# Patient Record
Sex: Female | Born: 1970
Health system: Southern US, Community
[De-identification: ages and names within clinical notes are randomized; demographics above are authoritative.]

## PROBLEM LIST (undated history)

## (undated) ENCOUNTER — Inpatient Hospital Stay: Payer: Self-pay | Admitting: Obstetrics and Gynecology

## (undated) DIAGNOSIS — N760 Acute vaginitis: Secondary | ICD-10-CM

## (undated) DIAGNOSIS — R319 Hematuria, unspecified: Secondary | ICD-10-CM

## (undated) DIAGNOSIS — C801 Malignant (primary) neoplasm, unspecified: Secondary | ICD-10-CM

## (undated) DIAGNOSIS — R0600 Dyspnea, unspecified: Secondary | ICD-10-CM

## (undated) DIAGNOSIS — G4730 Sleep apnea, unspecified: Secondary | ICD-10-CM

## (undated) DIAGNOSIS — J189 Pneumonia, unspecified organism: Secondary | ICD-10-CM

## (undated) DIAGNOSIS — R609 Edema, unspecified: Secondary | ICD-10-CM

## (undated) DIAGNOSIS — E079 Disorder of thyroid, unspecified: Secondary | ICD-10-CM

## (undated) DIAGNOSIS — D649 Anemia, unspecified: Secondary | ICD-10-CM

## (undated) DIAGNOSIS — N63 Unspecified lump in unspecified breast: Secondary | ICD-10-CM

## (undated) DIAGNOSIS — B9689 Other specified bacterial agents as the cause of diseases classified elsewhere: Secondary | ICD-10-CM

## (undated) DIAGNOSIS — I10 Essential (primary) hypertension: Secondary | ICD-10-CM

## (undated) HISTORY — DX: Unspecified lump in unspecified breast: N63.0

## (undated) HISTORY — DX: Other specified bacterial agents as the cause of diseases classified elsewhere: B96.89

## (undated) HISTORY — PX: ESOPHAGOGASTRODUODENOSCOPY: SHX1529

## (undated) HISTORY — PX: ABDOMINAL HYSTERECTOMY: SHX81

## (undated) HISTORY — DX: Malignant (primary) neoplasm, unspecified: C80.1

## (undated) HISTORY — PX: WISDOM TOOTH EXTRACTION: SHX21

## (undated) HISTORY — DX: Edema, unspecified: R60.9

## (undated) HISTORY — DX: Other specified bacterial agents as the cause of diseases classified elsewhere: N76.0

## (undated) HISTORY — PX: TONSILLECTOMY: SUR1361

## (undated) HISTORY — DX: Disorder of thyroid, unspecified: E07.9

## (undated) HISTORY — DX: Hematuria, unspecified: R31.9

## (undated) HISTORY — PX: DILATION AND CURETTAGE OF UTERUS: SHX78

## (undated) HISTORY — PX: FOOT SURGERY: SHX648

## (undated) HISTORY — PX: TUBAL LIGATION: SHX77

## (undated) HISTORY — DX: Essential (primary) hypertension: I10

---

## 2004-10-15 ENCOUNTER — Ambulatory Visit (HOSPITAL_COMMUNITY): Admission: RE | Admit: 2004-10-15 | Discharge: 2004-10-15 | Payer: Self-pay | Admitting: Gynecology

## 2004-10-15 ENCOUNTER — Encounter (INDEPENDENT_AMBULATORY_CARE_PROVIDER_SITE_OTHER): Payer: Self-pay | Admitting: *Deleted

## 2005-08-16 ENCOUNTER — Ambulatory Visit (HOSPITAL_COMMUNITY): Admission: RE | Admit: 2005-08-16 | Discharge: 2005-08-16 | Payer: Self-pay | Admitting: Gynecology

## 2005-11-08 ENCOUNTER — Inpatient Hospital Stay (HOSPITAL_COMMUNITY): Admission: AD | Admit: 2005-11-08 | Discharge: 2005-11-08 | Payer: Self-pay | Admitting: Gynecology

## 2005-12-28 ENCOUNTER — Ambulatory Visit (HOSPITAL_COMMUNITY): Admission: RE | Admit: 2005-12-28 | Discharge: 2005-12-28 | Payer: Self-pay | Admitting: Gynecology

## 2005-12-30 ENCOUNTER — Encounter (INDEPENDENT_AMBULATORY_CARE_PROVIDER_SITE_OTHER): Payer: Self-pay | Admitting: Specialist

## 2005-12-30 ENCOUNTER — Inpatient Hospital Stay (HOSPITAL_COMMUNITY): Admission: RE | Admit: 2005-12-30 | Discharge: 2006-01-02 | Payer: Self-pay | Admitting: Gynecology

## 2008-02-27 ENCOUNTER — Encounter (INDEPENDENT_AMBULATORY_CARE_PROVIDER_SITE_OTHER): Payer: Self-pay | Admitting: Gynecology

## 2008-02-27 ENCOUNTER — Ambulatory Visit: Payer: Self-pay | Admitting: Gynecology

## 2009-08-26 ENCOUNTER — Ambulatory Visit: Payer: Self-pay | Admitting: Internal Medicine

## 2010-02-04 ENCOUNTER — Ambulatory Visit: Payer: Self-pay | Admitting: Internal Medicine

## 2010-02-06 ENCOUNTER — Emergency Department: Payer: Self-pay | Admitting: Emergency Medicine

## 2010-12-07 ENCOUNTER — Ambulatory Visit: Payer: Self-pay | Admitting: Internal Medicine

## 2010-12-19 ENCOUNTER — Ambulatory Visit: Payer: Self-pay | Admitting: Internal Medicine

## 2011-03-30 ENCOUNTER — Ambulatory Visit: Payer: Self-pay | Admitting: Internal Medicine

## 2011-04-10 LAB — HM MAMMOGRAPHY: HM Mammogram: NORMAL

## 2011-05-06 NOTE — Op Note (Signed)
[name], [name]              ACCOUNT NO.:  [id number]   MEDICAL RECORD NO.:  [id number]          PATIENT TYPE:  INP   LOCATION:  9143                          FACILITY:  [facility]   PHYSICIAN:  Ginger Carne, MD  DATE OF BIRTH:  1971/08/05   DATE OF PROCEDURE:  12/30/2005  DATE OF DISCHARGE:                                 OPERATIVE REPORT   PREOPERATIVE DIAGNOSES:  1.  Fetal macrosomia.  2.  Sterilization request.  3.  Term pregnancy.   PREOPERATIVE DIAGNOSES:  1.  Term viable delivery of female infant.  2.  Sterilization.   PROCEDURES:  1.  Primary low transverse cesarean section.  2.  Pomeroy postpartum bilateral tubal ligation.   SURGEON:  Ginger Carne, M.D.   ASSISTANT:  None.   COMPLICATIONS:  None immediate.   ESTIMATED BLOOD LOSS:  650 mL.   SPECIMEN:  Cord blood and portions of right and left tubes.   ANESTHESIA:  Spinal and local Nesacaine intraperitoneal.   OPERATIVE FINDINGS:  Term infant female delivered in the early afternoon on  November 29, 2006, Apgar 9/9, weight 9 pounds 11 ounces, no gross  abnormalities.  Baby cried spontaneously at delivery, vertex presentation.  Amniotic fluid clear.  Fetus was unengaged.  Uterus, tubes and ovaries  showed normal decidual changes of pregnancy.  Placenta contained no  abnormalities, central insertion of the cord, three vessels.   OPERATIVE PROCEDURE:  The patient prepped and draped in the usual fashion  and placed in the left lateral supine position.  Betadine solution used for  antiseptic.  Patient catheterized prior to the procedure.  After adequate  spinal analgesia, a Pfannenstiel incision was made and the abdomen opened.  Bladder flap incised transversely, lower uterine segment incised  transversely.  Baby delivered, cord clamped and cut, and infant given to the  pediatrics staff after bulb suctioning.  Placenta removed manually.  Uterus  inspected.  Close the uterine musculature and one layer of 0  Vicryl running  interlocking suture.   Pomeroy bilateral tubal ligation performed by identifying the tubes separate  and apart from their respective round ligaments, 2-3 cm of tube in the  isthmus-ampullary junction were incorporated as a loop, with a 2-0 plain  catgut suture ties twice, afterwards tube severed above said knots and the  tips cauterized.  Bleeding points hemostatically checked.  Blood clots  removed from the abdomen.  Closure of the parietal peritoneum with 2-0  Vicryl running interlocking suture, 0 Vicryl for the fascia, 3-0 Vicryl for  the subcutaneous layer and 4-0 Prolene for a subcuticular closure.  Instrument and sponge count were correct.  The patient tolerated procedure  well and returned to the post anesthesia recovery room in excellent  condition.      Ginger Carne, MD  Electronically Signed     SHB/MEDQ  D:  12/30/2005  T:  12/30/2005  Job:  [id number]

## 2011-05-06 NOTE — Discharge Summary (Signed)
NAMEMERRYN, [name]              ACCOUNT NO.:  [id number]   MEDICAL RECORD NO.:  [id number]          PATIENT TYPE:  INP   LOCATION:  9143                          FACILITY:  [facility]   PHYSICIAN:  Ginger Carne, MD  DATE OF BIRTH:  1971/10/24   DATE OF ADMISSION:  12/30/2005  DATE OF DISCHARGE:  01/02/2006                                 DISCHARGE SUMMARY   REASON FOR HOSPITALIZATION:  Suspected fetal macrosomia. Estimated fetal  weight of 4706 g plus or minus 15%.   IN-HOSPITAL PROCEDURES:  Primary low transverse cesarean section and Pomeroy  bilateral tubal ligation.   FINAL DIAGNOSES:  1.  Sterilization.  2.  Fetal macrosomia.  3.  Term viable delivery of female infant.   HOSPITAL COURSE:  This patient is a 40 year old gravida 3 para 2-0-1-2  Caucasian female at 3 and zero-sevenths weeks gestation at the time of  admission; January19,2006, [facility]; admitted for a low transverse cesarean  section with bilateral tubal ligation because of suspected fetal macrosomia  and a request for permanent sterilization. The patient clinically appeared  to have a fetus weighing approximately 10 pounds. Ultrasound obtained on the  day prior to her admission at [facility] [place] indicated an  estimated weight of 4706 g. The cervix was uninducible and the fetus was  unengaged. The patient is O positive, rubella immune, group B strep  negative. Diabetic screen performed at [redacted] weeks gestation was within normal  limits and multiple marker screen test at 16 weeks was normal.   The patient's medical history includes hypothyroidism and depression.   The patient underwent a primary low transverse cesarean section and Pomeroy  bilateral tubal ligation on December 30, 2005. The weight of the infant was 9  pounds 11 ounces. Apgars were 9 and 9, no gross abnormalities, the baby  cried spontaneously at delivery. The patient's postoperative course was  uneventful. She was afebrile, voided well,  incision was dry, calves without  tenderness, and lungs were clear. Postoperative hemoglobin 10.7, hematocrit  31.2.   The patient was given routine postoperative instructions including  contacting the office for temperature elevation above 100.4 degrees  Fahrenheit, increasing abdominal pain, incisional drainage or erythema,  gastrointestinal or genitourinary symptoms, and/or heavy vaginal bleeding.  The patient was advised about wound care.   The patient was discharged with Percocet 5/325 one to two every 4-6 hours.  She will continue Levoxyl 75 mcg daily and Zoloft 50 mg daily. She will  return in 3 days to have her subcuticular suture removed and 6 weeks for her  postoperative visit.      Ginger Carne, MD  Electronically Signed     SHB/MEDQ  D:  01/02/2006  T:  01/02/2006  Job:  [id number]

## 2011-05-06 NOTE — Op Note (Signed)
[name], [name]              ACCOUNT NO.:  [id number]   MEDICAL RECORD NO.:  [id number]          PATIENT TYPE:  AMB   LOCATION:  SDC                           [facility]ILITY:  [facility]   PHYSICIAN:  Ginger Carne, MD  DATE OF BIRTH:  [birth year]   DATE OF PROCEDURE:  10/15/2004  DATE OF DISCHARGE:                                 OPERATIVE REPORT   PREOPERATIVE DIAGNOSES:  Abnormal uterine bleeding.   POSTOPERATIVE DIAGNOSES:  1.  Abnormal uterine bleeding.  2.  Hyperplasia of endometrium.   PROCEDURE:  Hysteroscopy with dilatation and curettage.   SURGEON:  Ginger Carne, MD   ASSISTANT:  None.   COMPLICATIONS:  None immediate.   ANESTHESIA:  General.   ESTIMATED BLOOD LOSS:  Negligible.   SPECIMENS:  Uterine curettings.   FINDINGS:  External genitalia, vulva and vagina were normal, cervix smooth  without erosions or lesions and though cervical canal appeared normal on  hysteroscopy, advancement of said scope revealed evidence of diffuse  hyperplasia without evidence for polyps, fibroids or other abnormalities.  Both ostia were identified. The fundus appeared normal. All four walls were  identified.   DESCRIPTION OF PROCEDURE:  The patient was prepped and draped in the usual  fashion, placed in lithotomy position, Betadine solution used for antiseptic  and the patient was not catheterized. A tenaculum placed on the anterior lip  of the cervix, dilatation to accommodate a hysteroscope was followed by  sharp curettage. Specimen to pathology. The patient returned to the post  anesthesia recovery room in excellent condition.     Stev   SHB/MEDQ  D:  10/15/2004  T:  10/15/2004  Job:  [id number]

## 2011-05-06 NOTE — H&P (Signed)
NAMEJASMA, [name]              ACCOUNT NO.:  [id number]   MEDICAL RECORD NO.:  [id number]          PATIENT TYPE:  INP   LOCATION:  NA                            [facility]   PHYSICIAN:  Shelby Carne, MD  DATE OF BIRTH:  12-Sep-1971   DATE OF ADMISSION:  12/30/2005  DATE OF DISCHARGE:                                HISTORY & PHYSICAL   REASON FOR HOSPITALIZATION:  Fetal macrosomia, estimated fetal weight 4706  g, plus/minus 15%.   HISTORY OF PRESENT ILLNESS:  This patient is a 40 year old gravida 3, para 1-  0-1-1 Caucasian female, [facility] January 06, 2005, at 39-0/7 weeks' gestation  admitted for a primary low cesarean section with bilateral tubal ligation  for purposes of sterilization.  The patient underwent an ultrasound at the  [facility] [place] on the December 29, 2004, with findings  consistent with an estimated fetal weight of 4706 grams plus/minus 706  grams.  The fetus is in the cephalic presentation.  Normal fluid for a  gestational age and no fetal anatomical abnormalities.   The patient's antenatal course has been uneventful.  She has a history of  hypothyroidism and depression.  She is O positive, rubella immune, group B  strep negative.  Her diabetic screen performed at 26 weeks was within normal  limits, and multiple marker screen test was negative at 16 weeks.   PAST OBSTETRICAL/GYNECOLOGIC HISTORY:  The patient had a normal vaginal  delivery in 1999 and a spontaneous miscarriage in 2005.   MEDICAL HISTORY:  Hypothyroidism and depression.   MEDICATIONS:  1.  Levoxyl 75 mcg daily.  2.  Zoloft 15 mcg daily.   PAST SURGICAL HISTORY:  Negative.   SOCIAL HISTORY:  Smoking:  The patient smokes less than 1/2 pack of  cigarettes daily.  Denies alcohol or drug abuse.   REVIEW OF SYSTEMS:  Negative.   FAMILY HISTORY:  Family history is negative for genetic diseases or first-  degree relatives with breast, colon, ovarian or uterine carcinoma.   PHYSICAL EXAMINATION:  VITAL SIGNS:  Blood pressure 123/82.  Weight 287  pounds.  HEENT:  Grossly normal.  CHEST:  Clear.  CARDIAC:  Exam without murmurs or enlargements.  BREASTS:  Without masses, discharge, thickenings or tenderness.  CHEST:  Clear to percussion and auscultation.  EXTREMITIES:  Normal.  LYMPHATICS:  Normal.  SKIN:  Normal.  NEUROLOGIC:  Normal.  MUSCULOSKELETAL:  Normal.  ABDOMEN:  The abdomen reveals full-term pregnancy.  PELVIC:  Long, closed and thick cervix.  External genitalia, vulva, vagina  are normal without erosions or lesions present.   IMPRESSION:  Fetal macrosomia at term.   PLAN:  The patient was apprised of the nature and risks related to  delivering a fetus with an estimated fetal weight of 4700 grams.  She  understands the risks of shoulder dystocia as well as pelvic floor injury  are possibilities.  She also understands ultrasounds are plus/minus 15% in  their estimation of weight.  The patient has opted for a primary low  transverse cesarean section and bilateral tubal ligation for sterilization.  The nature of said procedure discussed in detail and all questions answered  to their satisfaction of said patient.      Shelby Carne, MD  Electronically Signed     SHB/MEDQ  D:  12/29/2005  T:  12/30/2005  Job:  [id number]

## 2011-05-10 LAB — HM PAP SMEAR: HM Pap smear: NORMAL

## 2011-12-20 DIAGNOSIS — N63 Unspecified lump in unspecified breast: Secondary | ICD-10-CM

## 2011-12-20 HISTORY — PX: BREAST BIOPSY: SHX20

## 2011-12-20 HISTORY — DX: Unspecified lump in unspecified breast: N63.0

## 2012-03-08 ENCOUNTER — Other Ambulatory Visit: Payer: Self-pay | Admitting: *Deleted

## 2012-03-08 DIAGNOSIS — Z760 Encounter for issue of repeat prescription: Secondary | ICD-10-CM

## 2012-03-08 MED ORDER — HYDROCHLOROTHIAZIDE 25 MG PO TABS: 25.0000 mg | ORAL_TABLET | Freq: Every day | ORAL | Status: DC

## 2012-03-08 NOTE — Progress Notes (Signed)
Patient has not been seen by Dr. Dan Humphreys. Needs to establish

## 2012-05-09 ENCOUNTER — Encounter: Payer: Self-pay | Admitting: Internal Medicine

## 2012-05-09 ENCOUNTER — Ambulatory Visit (INDEPENDENT_AMBULATORY_CARE_PROVIDER_SITE_OTHER): Payer: Managed Care, Other (non HMO) | Admitting: Internal Medicine

## 2012-05-09 VITALS — BP 123/79 | HR 79 | Temp 98.1°F | Resp 16 | Ht 64.75 in | Wt 293.8 lb

## 2012-05-09 DIAGNOSIS — I10 Essential (primary) hypertension: Secondary | ICD-10-CM

## 2012-05-09 DIAGNOSIS — Z0000 Encounter for general adult medical examination without abnormal findings: Secondary | ICD-10-CM

## 2012-05-09 DIAGNOSIS — E039 Hypothyroidism, unspecified: Secondary | ICD-10-CM | POA: Insufficient documentation

## 2012-05-09 LAB — CBC WITH DIFFERENTIAL/PLATELET
Basophils Absolute: 0.1 10*3/uL (ref 0.0–0.1)
Basophils Relative: 0.7 % (ref 0.0–3.0)
Eosinophils Absolute: 0.3 10*3/uL (ref 0.0–0.7)
Eosinophils Relative: 2.7 % (ref 0.0–5.0)
HCT: 40.9 % (ref 36.0–46.0)
Hemoglobin: 13.7 g/dL (ref 12.0–15.0)
Lymphocytes Relative: 30.1 % (ref 12.0–46.0)
Lymphs Abs: 3.1 10*3/uL (ref 0.7–4.0)
MCHC: 33.5 g/dL (ref 30.0–36.0)
MCV: 88 fl (ref 78.0–100.0)
Monocytes Absolute: 0.5 10*3/uL (ref 0.1–1.0)
Monocytes Relative: 4.6 % (ref 3.0–12.0)
Neutro Abs: 6.3 10*3/uL (ref 1.4–7.7)
Neutrophils Relative %: 61.9 % (ref 43.0–77.0)
Platelets: 276 10*3/uL (ref 150.0–400.0)
RBC: 4.65 Mil/uL (ref 3.87–5.11)
RDW: 13.6 % (ref 11.5–14.6)
WBC: 10.1 10*3/uL (ref 4.5–10.5)

## 2012-05-09 LAB — COMPREHENSIVE METABOLIC PANEL
ALT: 17 U/L (ref 0–35)
AST: 19 U/L (ref 0–37)
Albumin: 3.9 g/dL (ref 3.5–5.2)
Alkaline Phosphatase: 87 U/L (ref 39–117)
BUN: 13 mg/dL (ref 6–23)
CO2: 27 mEq/L (ref 19–32)
Calcium: 9.1 mg/dL (ref 8.4–10.5)
Chloride: 103 mEq/L (ref 96–112)
Creatinine, Ser: 0.8 mg/dL (ref 0.4–1.2)
GFR: 90.49 mL/min (ref >60.00)
Glucose, Bld: 89 mg/dL (ref 70–99)
Potassium: 3.5 mEq/L (ref 3.5–5.1)
Sodium: 139 mEq/L (ref 135–145)
Total Bilirubin: 0.7 mg/dL (ref 0.3–1.2)
Total Protein: 7.2 g/dL (ref 6.0–8.3)

## 2012-05-09 LAB — MICROALBUMIN / CREATININE URINE RATIO
Creatinine,U: 263.6 mg/dL
Microalb Creat Ratio: 1 mg/g (ref 0.0–30.0)
Microalb, Ur: 2.6 mg/dL — ABNORMAL HIGH (ref 0.0–1.9)

## 2012-05-09 LAB — LIPID PANEL
Cholesterol: 148 mg/dL (ref 0–200)
HDL: 39.3 mg/dL (ref >39.00)
LDL Cholesterol: 88 mg/dL (ref 0–99)
Total CHOL/HDL Ratio: 4
Triglycerides: 103 mg/dL (ref 0.0–149.0)
VLDL: 20.6 mg/dL (ref 0.0–40.0)

## 2012-05-09 LAB — TSH: TSH: 0.53 u[IU]/mL (ref 0.35–5.50)

## 2012-05-09 MED ORDER — LEVOTHYROXINE SODIUM 88 MCG PO TABS: 88.0000 ug | ORAL_TABLET | Freq: Every day | ORAL | Status: DC

## 2012-05-09 MED ORDER — HYDROCHLOROTHIAZIDE 25 MG PO TABS: 25.0000 mg | ORAL_TABLET | Freq: Every day | ORAL | Status: DC

## 2012-05-09 NOTE — Progress Notes (Signed)
Subjective:    Patient ID: [name], female    DOB: [birth year], 41 y.o.   MRN: [id number]  HPI 40YO female with h/o hypertension and hypothyroidism presents for annual exam. Doing well. Only concern today is some persistent pain in bilateral ankles which has been chronic for her. Secondary to diminished arch, followed by orthopedics. Otherwise, feeling well. Normal energy level and appetite.   Outpatient Encounter Prescriptions as of 05/09/2012  Medication Sig Dispense Refill  . hydrochlorothiazide (HYDRODIURIL) 25 MG tablet Take 1 tablet (25 mg total) by mouth daily.  90 tablet  4  . levothyroxine (SYNTHROID, LEVOTHROID) 88 MCG tablet Take 1 tablet (88 mcg total) by mouth daily.  90 tablet  4    Review of Systems  Constitutional: Negative for fever, chills, appetite change, fatigue and unexpected weight change.  HENT: Negative for ear pain, congestion, sore throat, trouble swallowing, neck pain, voice change and sinus pressure.   Eyes: Negative for visual disturbance.  Respiratory: Negative for cough, shortness of breath, wheezing and stridor.   Cardiovascular: Negative for chest pain, palpitations and leg swelling.  Gastrointestinal: Negative for nausea, vomiting, abdominal pain, diarrhea, constipation, blood in stool, abdominal distention and anal bleeding.  Genitourinary: Negative for dysuria and flank pain.  Musculoskeletal: Positive for arthralgias. Negative for myalgias and gait problem.  Skin: Negative for color change and rash.  Neurological: Negative for dizziness and headaches.  Hematological: Negative for adenopathy. Does not bruise/bleed easily.  Psychiatric/Behavioral: Negative for suicidal ideas, sleep disturbance and dysphoric mood. The patient is not nervous/anxious.    BP 123/79  Pulse 79  Temp(Src) 98.1 F (36.7 C) (Oral)  Resp 16  Ht 5' 4.75" (1.645 m)  Wt 293 lb 12 oz (133.244 kg)  BMI 49.26 kg/m2  SpO2 97%  LMP 04/18/2012     Objective:   Physical  Exam  Constitutional: She is oriented to person, place, and time. She appears well-developed and well-nourished. No distress.  HENT:  Head: Normocephalic and atraumatic.  Right Ear: External ear normal.  Left Ear: External ear normal.  Nose: Nose normal.  Mouth/Throat: Oropharynx is clear and moist. No oropharyngeal exudate.  Eyes: Conjunctivae are normal. Pupils are equal, round, and reactive to light. Right eye exhibits no discharge. Left eye exhibits no discharge. No scleral icterus.  Neck: Normal range of motion. Neck supple. No tracheal deviation present. No thyromegaly present.  Cardiovascular: Normal rate, regular rhythm, normal heart sounds and intact distal pulses.  Exam reveals no gallop and no friction rub.   No murmur heard. Pulmonary/Chest: Effort normal and breath sounds normal. No accessory muscle usage. Not tachypneic. No respiratory distress. She has no decreased breath sounds. She has no wheezes. She has no rales. She exhibits no tenderness. Right breast exhibits no inverted nipple, no mass, no nipple discharge, no skin change and no tenderness. Left breast exhibits no inverted nipple, no mass, no nipple discharge, no skin change and no tenderness. Breasts are symmetrical.  Abdominal: Soft. Bowel sounds are normal. She exhibits no distension and no mass. There is no tenderness. There is no guarding.  Musculoskeletal: Normal range of motion. She exhibits no edema and no tenderness.       Right foot: She exhibits deformity (flat foot).       Left foot: She exhibits deformity (flat foot).  Lymphadenopathy:    She has no cervical adenopathy.  Neurological: She is alert and oriented to person, place, and time. No cranial nerve deficit. She exhibits normal muscle tone. Coordination  normal.  Skin: Skin is warm and dry. No rash noted. She is not diaphoretic. No erythema. No pallor.  Psychiatric: She has a normal mood and affect. Her behavior is normal. Judgment and thought content  normal.          Assessment & Plan:

## 2012-05-09 NOTE — Assessment & Plan Note (Signed)
Exam normal today including breast exam. PAP UTD. Will request record on this. Mammogram 2012 was normal, will plan every other year. Will get labs today including CBC, CMP, lipids.

## 2012-05-09 NOTE — Assessment & Plan Note (Signed)
BP well controlled. Will check renal function and urine microalbumin with labs today. Follow up 6 months.

## 2012-05-09 NOTE — Assessment & Plan Note (Signed)
Will check TSH with labs today. 

## 2012-08-08 ENCOUNTER — Encounter: Payer: Self-pay | Admitting: Internal Medicine

## 2012-11-12 ENCOUNTER — Ambulatory Visit: Payer: Managed Care, Other (non HMO)

## 2012-11-12 ENCOUNTER — Encounter: Payer: Self-pay | Admitting: Internal Medicine

## 2012-11-12 ENCOUNTER — Ambulatory Visit (INDEPENDENT_AMBULATORY_CARE_PROVIDER_SITE_OTHER): Payer: Managed Care, Other (non HMO) | Admitting: Internal Medicine

## 2012-11-12 VITALS — BP 124/80 | HR 90 | Temp 98.3°F | Resp 16 | Wt 299.0 lb

## 2012-11-12 DIAGNOSIS — R29898 Other symptoms and signs involving the musculoskeletal system: Secondary | ICD-10-CM | POA: Insufficient documentation

## 2012-11-12 DIAGNOSIS — E669 Obesity, unspecified: Secondary | ICD-10-CM | POA: Insufficient documentation

## 2012-11-12 DIAGNOSIS — M25579 Pain in unspecified ankle and joints of unspecified foot: Secondary | ICD-10-CM

## 2012-11-12 DIAGNOSIS — I10 Essential (primary) hypertension: Secondary | ICD-10-CM

## 2012-11-12 DIAGNOSIS — E039 Hypothyroidism, unspecified: Secondary | ICD-10-CM

## 2012-11-12 LAB — CBC WITH DIFFERENTIAL/PLATELET
Basophils Absolute: 0.1 10*3/uL (ref 0.0–0.1)
Basophils Relative: 0.5 % (ref 0.0–3.0)
Eosinophils Absolute: 0.3 10*3/uL (ref 0.0–0.7)
Eosinophils Relative: 3 % (ref 0.0–5.0)
HCT: 39.3 % (ref 36.0–46.0)
Hemoglobin: 12.9 g/dL (ref 12.0–15.0)
Lymphocytes Relative: 24 % (ref 12.0–46.0)
Lymphs Abs: 2.5 10*3/uL (ref 0.7–4.0)
MCHC: 32.7 g/dL (ref 30.0–36.0)
MCV: 88.9 fl (ref 78.0–100.0)
Monocytes Absolute: 0.6 10*3/uL (ref 0.1–1.0)
Monocytes Relative: 5.6 % (ref 3.0–12.0)
Neutro Abs: 7.1 10*3/uL (ref 1.4–7.7)
Neutrophils Relative %: 66.9 % (ref 43.0–77.0)
Platelets: 266 10*3/uL (ref 150.0–400.0)
RBC: 4.42 Mil/uL (ref 3.87–5.11)
RDW: 13.5 % (ref 11.5–14.6)
WBC: 10.6 10*3/uL — ABNORMAL HIGH (ref 4.5–10.5)

## 2012-11-12 LAB — SEDIMENTATION RATE: Sed Rate: 31 mm/hr — ABNORMAL HIGH (ref 0–22)

## 2012-11-12 LAB — COMPREHENSIVE METABOLIC PANEL
ALT: 19 U/L (ref 0–35)
AST: 20 U/L (ref 0–37)
Albumin: 3.8 g/dL (ref 3.5–5.2)
Alkaline Phosphatase: 87 U/L (ref 39–117)
BUN: 13 mg/dL (ref 6–23)
CO2: 29 mEq/L (ref 19–32)
Calcium: 8.9 mg/dL (ref 8.4–10.5)
Chloride: 102 mEq/L (ref 96–112)
Creatinine, Ser: 0.7 mg/dL (ref 0.4–1.2)
GFR: 104.61 mL/min (ref >60.00)
Glucose, Bld: 94 mg/dL (ref 70–99)
Potassium: 3.8 mEq/L (ref 3.5–5.1)
Sodium: 137 mEq/L (ref 135–145)
Total Bilirubin: 0.4 mg/dL (ref 0.3–1.2)
Total Protein: 7.3 g/dL (ref 6.0–8.3)

## 2012-11-12 LAB — TSH: TSH: 1.06 u[IU]/mL (ref 0.35–5.50)

## 2012-11-12 LAB — VITAMIN B12: Vitamin B-12: 233 pg/mL (ref 211–911)

## 2012-11-12 LAB — T4, FREE: Free T4: 0.86 ng/dL (ref 0.60–1.60)

## 2012-11-12 MED ORDER — HYDROCHLOROTHIAZIDE 25 MG PO TABS: 25.0000 mg | ORAL_TABLET | Freq: Every day | ORAL | Status: DC

## 2012-11-12 NOTE — Assessment & Plan Note (Signed)
BMI 50. Suspect weight gain is in part secondary to untreated hypothyroidism. Encouraged better compliance with thyroid medication. Will check TSH and free T4 with labs today. Patient is interested in considering surgical options for weight loss. We'll also set up referral to bariatric surgeon at [facility].

## 2012-11-12 NOTE — Assessment & Plan Note (Signed)
Blood pressure well-controlled today on current medications. Will check renal function with labs. Followup in 4 weeks.

## 2012-11-12 NOTE — Assessment & Plan Note (Signed)
Patient has not been taking Synthroid for the last several weeks. Suspect this is the cause of lower should any weakness, weight gain, and fatigue. Will check TSH and free T4 with labs today. Followup in 4 weeks.

## 2012-11-12 NOTE — Progress Notes (Signed)
Subjective:    Patient ID: [name], female    DOB: August 13, 1971, 41 y.o.   MRN: [id number]  HPI 41 year old female with history of hypertension, hypothyroidism, obesity presents for followup. She reports over the last several months she has not been taking her thyroid medication. She did not know that this is necessary. She reports fatigue, weakness in both of her lower legs, and increased anxiety over the last couple of months. She is also concerned about chronic pain in both of her ankles and feet. She was seen by orthopedics in the past and was recommended that she have reconstruction of her ankles but she had decided to defer at that point. She reports some difficulty with stability in her ankles and with flat feet. She occasionally has aching pain in her ankles which radiates up her legs. She also has occasional swelling in her lower charities which seems to resolve with elevation of her legs. She denies pain in her calves. She denies discoloration of her legs. In regards to hypertension, she reports compliance with medication. She denies any headache, chest pain, palpitations.  She is also concerned about her weight. She feels that her food consumption is such that she should be losing weight. She does not currently keep a food diary or calculi caloric intake. She does not follow a regular exercise program. She is interested in considering surgical options to help with weight loss.  Outpatient Encounter Prescriptions as of 11/12/2012  Medication Sig Dispense Refill  . hydrochlorothiazide (HYDRODIURIL) 25 MG tablet Take 1 tablet (25 mg total) by mouth daily.  90 tablet  4  . levothyroxine (SYNTHROID, LEVOTHROID) 88 MCG tablet Take 1 tablet (88 mcg total) by mouth daily.  90 tablet  4  . [DISCONTINUED] hydrochlorothiazide (HYDRODIURIL) 25 MG tablet Take 1 tablet (25 mg total) by mouth daily.  90 tablet  4   BP 124/80  Pulse 90  Temp 98.3 F (36.8 C) (Oral)  Resp 16  Wt 299 lb (135.626  kg)  LMP 10/29/2012  Review of Systems  Constitutional: Negative for fever, chills, appetite change, fatigue and unexpected weight change.  HENT: Negative for ear pain, congestion, sore throat, trouble swallowing, neck pain, voice change and sinus pressure.   Eyes: Negative for visual disturbance.  Respiratory: Negative for cough, shortness of breath, wheezing and stridor.   Cardiovascular: Negative for chest pain, palpitations and leg swelling.  Gastrointestinal: Negative for nausea, vomiting, abdominal pain, diarrhea, constipation, blood in stool, abdominal distention and anal bleeding.  Genitourinary: Negative for dysuria and flank pain.  Musculoskeletal: Positive for myalgias and arthralgias. Negative for gait problem.  Skin: Negative for color change and rash.  Neurological: Negative for dizziness and headaches.  Hematological: Negative for adenopathy. Does not bruise/bleed easily.  Psychiatric/Behavioral: Negative for suicidal ideas, sleep disturbance and dysphoric mood. The patient is not nervous/anxious.        Objective:   Physical Exam  Constitutional: She is oriented to person, place, and time. She appears well-developed and well-nourished. No distress.  HENT:  Head: Normocephalic and atraumatic.  Right Ear: External ear normal.  Left Ear: External ear normal.  Nose: Nose normal.  Mouth/Throat: Oropharynx is clear and moist. No oropharyngeal exudate.  Eyes: Conjunctivae normal are normal. Pupils are equal, round, and reactive to light. Right eye exhibits no discharge. Left eye exhibits no discharge. No scleral icterus.  Neck: Normal range of motion. Neck supple. No tracheal deviation present. No thyromegaly present.  Cardiovascular: Normal rate, regular rhythm, normal heart  sounds and intact distal pulses.  Exam reveals no gallop and no friction rub.   No murmur heard. Pulmonary/Chest: Effort normal and breath sounds normal. No respiratory distress. She has no wheezes. She  has no rales. She exhibits no tenderness.  Musculoskeletal: Normal range of motion. She exhibits edema (trace bilateral LE). She exhibits no tenderness.       Right foot: She exhibits deformity (pes planus).       Left foot: She exhibits deformity (pes planus).  Lymphadenopathy:    She has no cervical adenopathy.  Neurological: She is alert and oriented to person, place, and time. No cranial nerve deficit. She exhibits normal muscle tone. Coordination normal.  Reflex Scores:      Patellar reflexes are 1+ on the right side and 1+ on the left side. Skin: Skin is warm and dry. No rash noted. She is not diaphoretic. No erythema. No pallor.  Psychiatric: Her behavior is normal. Judgment and thought content normal. Cognition and memory are normal. She exhibits a depressed mood.          Assessment & Plan:

## 2012-11-12 NOTE — Assessment & Plan Note (Signed)
Suspect this is related to her hypothyroidism. Will check TSH, B12, CMP with labs today. Followup 4 weeks.

## 2012-11-12 NOTE — Assessment & Plan Note (Signed)
Patient with bilateral ankle and foot pain. Pes planus noted on exam. Patient was followed by orthopedics in the past and recommended to have surgical intervention to help with ankle stability. Will set up followup with orthopedic surgeon.

## 2012-11-13 LAB — ANA: Anti Nuclear Antibody(ANA): NEGATIVE

## 2012-11-13 LAB — RHEUMATOID FACTOR: Rhuematoid fact SerPl-aCnc: <10 IU/mL (ref <=14)

## 2012-11-14 LAB — METHYLMALONIC ACID, SERUM: Methylmalonic Acid, Quant: 0.13 umol/L (ref <0.40)

## 2012-11-20 DIAGNOSIS — M2140 Flat foot [pes planus] (acquired), unspecified foot: Secondary | ICD-10-CM | POA: Insufficient documentation

## 2012-12-19 DIAGNOSIS — R319 Hematuria, unspecified: Secondary | ICD-10-CM

## 2012-12-19 HISTORY — DX: Hematuria, unspecified: R31.9

## 2012-12-25 ENCOUNTER — Ambulatory Visit (INDEPENDENT_AMBULATORY_CARE_PROVIDER_SITE_OTHER): Payer: Managed Care, Other (non HMO)

## 2012-12-25 ENCOUNTER — Other Ambulatory Visit (INDEPENDENT_AMBULATORY_CARE_PROVIDER_SITE_OTHER): Payer: Managed Care, Other (non HMO)

## 2012-12-25 ENCOUNTER — Telehealth: Payer: Self-pay | Admitting: *Deleted

## 2012-12-25 VITALS — Ht 64.75 in

## 2012-12-25 DIAGNOSIS — Z0000 Encounter for general adult medical examination without abnormal findings: Secondary | ICD-10-CM

## 2012-12-25 LAB — COMPREHENSIVE METABOLIC PANEL
ALT: 17 U/L (ref 0–35)
AST: 17 U/L (ref 0–37)
Albumin: 3.6 g/dL (ref 3.5–5.2)
Alkaline Phosphatase: 86 U/L (ref 39–117)
BUN: 15 mg/dL (ref 6–23)
CO2: 28 mEq/L (ref 19–32)
Calcium: 9 mg/dL (ref 8.4–10.5)
Chloride: 104 mEq/L (ref 96–112)
Creatinine, Ser: 0.7 mg/dL (ref 0.4–1.2)
GFR: 94.56 mL/min (ref >60.00)
Glucose, Bld: 90 mg/dL (ref 70–99)
Potassium: 3.7 mEq/L (ref 3.5–5.1)
Sodium: 137 mEq/L (ref 135–145)
Total Bilirubin: 0.8 mg/dL (ref 0.3–1.2)
Total Protein: 6.4 g/dL (ref 6.0–8.3)

## 2012-12-25 LAB — CBC WITH DIFFERENTIAL/PLATELET
Basophils Absolute: 0.1 10*3/uL (ref 0.0–0.1)
Basophils Relative: 0.6 % (ref 0.0–3.0)
Eosinophils Absolute: 0.3 10*3/uL (ref 0.0–0.7)
Eosinophils Relative: 3.1 % (ref 0.0–5.0)
HCT: 38.6 % (ref 36.0–46.0)
Hemoglobin: 13.1 g/dL (ref 12.0–15.0)
Lymphocytes Relative: 28.8 % (ref 12.0–46.0)
Lymphs Abs: 2.9 10*3/uL (ref 0.7–4.0)
MCHC: 33.8 g/dL (ref 30.0–36.0)
MCV: 87.8 fl (ref 78.0–100.0)
Monocytes Absolute: 0.5 10*3/uL (ref 0.1–1.0)
Monocytes Relative: 5 % (ref 3.0–12.0)
Neutro Abs: 6.3 10*3/uL (ref 1.4–7.7)
Neutrophils Relative %: 62.5 % (ref 43.0–77.0)
Platelets: 261 10*3/uL (ref 150.0–400.0)
RBC: 4.4 Mil/uL (ref 3.87–5.11)
RDW: 13.2 % (ref 11.5–14.6)
WBC: 10.1 10*3/uL (ref 4.5–10.5)

## 2012-12-25 LAB — LIPID PANEL
Cholesterol: 158 mg/dL (ref 0–200)
HDL: 37.2 mg/dL — ABNORMAL LOW (ref >39.00)
LDL Cholesterol: 96 mg/dL (ref 0–99)
Total CHOL/HDL Ratio: 4
Triglycerides: 125 mg/dL (ref 0.0–149.0)
VLDL: 25 mg/dL (ref 0.0–40.0)

## 2012-12-25 LAB — T4, FREE: Free T4: 0.87 ng/dL (ref 0.60–1.60)

## 2012-12-25 LAB — TSH: TSH: 0.91 u[IU]/mL (ref 0.35–5.50)

## 2012-12-25 NOTE — Telephone Encounter (Signed)
What labs and dx would you like for this pt?  

## 2012-12-25 NOTE — Telephone Encounter (Signed)
TSH, Free T4, Lipids, CMP, CBC

## 2013-01-01 DIAGNOSIS — M76829 Posterior tibial tendinitis, unspecified leg: Secondary | ICD-10-CM | POA: Insufficient documentation

## 2013-01-08 ENCOUNTER — Ambulatory Visit: Payer: Managed Care, Other (non HMO) | Admitting: Internal Medicine

## 2013-01-10 ENCOUNTER — Ambulatory Visit (INDEPENDENT_AMBULATORY_CARE_PROVIDER_SITE_OTHER): Payer: Managed Care, Other (non HMO) | Admitting: Internal Medicine

## 2013-01-10 ENCOUNTER — Encounter: Payer: Self-pay | Admitting: Internal Medicine

## 2013-01-10 VITALS — BP 120/78 | HR 96 | Temp 98.8°F | Ht 65.0 in | Wt 298.5 lb

## 2013-01-10 DIAGNOSIS — E049 Nontoxic goiter, unspecified: Secondary | ICD-10-CM

## 2013-01-10 DIAGNOSIS — M659 Synovitis and tenosynovitis, unspecified: Secondary | ICD-10-CM

## 2013-01-10 DIAGNOSIS — M7752 Other enthesopathy of left foot: Secondary | ICD-10-CM

## 2013-01-10 MED ORDER — MELOXICAM 15 MG PO TABS: 15.0000 mg | ORAL_TABLET | Freq: Every day | ORAL | Status: DC

## 2013-01-10 NOTE — Assessment & Plan Note (Signed)
Per patient, plan is for reconstructive surgery this summer. Will try adding meloxicam in the interim to see if any improvement in symptoms. She is also planning to have a brace made to help with structural support.

## 2013-01-10 NOTE — Assessment & Plan Note (Signed)
Patient is symptomatic with goiter. Will get thyroid ultrasound for further evaluation. Recent TSH and free T4 were normal. Given ongoing symptoms which seem consistent with hypothyroidism, will set up endocrine evaluation.

## 2013-01-10 NOTE — Progress Notes (Signed)
Subjective:    Patient ID: [name], female    DOB: [birth year], 42 y.o.   MRN: [id number]  HPI 42 year old female with history of obesity, thyroid dysfunction, and chronic tendinitis of her left ankle presents for followup. In the interim since her last visit, she had MRI of her left ankle which showed posterior tibial tenosynovitis. She met with orthopedic surgeon and plan, per patient, plan is for reconstructive surgery this summer. She is deferring until the summer because she will need to be off her feet for 12 weeks. She continues to have pain in her left ankle which limits her physical activity. She is currently taking ibuprofen with minimal improvement.  In regards to thyroid dysfunction, she has not been taking levothyroxine. Recent TSH and free T4 were normal. However, she continues to have symptoms of inability to lose weight, cold intolerance. In the past, she was told she had hypothyroidism which was mild and should be treated with thyroid medication. She also notes some fullness around her neck and occasional sense of pressure on her anterior neck when laying flat. She decided difficulty swallowing or breathing.  Outpatient Encounter Prescriptions as of 01/10/2013  Medication Sig Dispense Refill  . hydrochlorothiazide (HYDRODIURIL) 25 MG tablet Take 1 tablet (25 mg total) by mouth daily.  90 tablet  4  . [DISCONTINUED] ibuprofen (ADVIL,MOTRIN) 200 MG tablet Take 200 mg by mouth every 6 (six) hours as needed.      . [DISCONTINUED] levothyroxine (SYNTHROID, LEVOTHROID) 88 MCG tablet Take 1 tablet (88 mcg total) by mouth daily.  90 tablet  4  . meloxicam (MOBIC) 15 MG tablet Take 1 tablet (15 mg total) by mouth daily.  30 tablet  3   BP 120/78  Pulse 96  Temp 98.8 F (37.1 C) (Oral)  Ht 5\' 5"  (1.651 m)  Wt 298 lb 8 oz (135.399 kg)  BMI 49.67 kg/m2  SpO2 97%  LMP 01/03/2013  Review of Systems  Constitutional: Positive for fatigue. Negative for fever, chills, appetite change  and unexpected weight change.  HENT: Negative for ear pain, congestion, sore throat, trouble swallowing, neck pain, voice change and sinus pressure.   Eyes: Negative for visual disturbance.  Respiratory: Negative for cough, shortness of breath, wheezing and stridor.   Cardiovascular: Negative for chest pain, palpitations and leg swelling.  Gastrointestinal: Negative for nausea, vomiting, abdominal pain, diarrhea, constipation, blood in stool, abdominal distention and anal bleeding.  Genitourinary: Negative for dysuria and flank pain.  Musculoskeletal: Positive for myalgias and arthralgias. Negative for gait problem.  Skin: Negative for color change and rash.  Neurological: Negative for dizziness and headaches.  Hematological: Negative for adenopathy. Does not bruise/bleed easily.  Psychiatric/Behavioral: Negative for suicidal ideas, sleep disturbance and dysphoric mood. The patient is not nervous/anxious.        Objective:   Physical Exam  Constitutional: She is oriented to person, place, and time. She appears well-developed and well-nourished. No distress.  HENT:  Head: Normocephalic and atraumatic.  Right Ear: External ear normal.  Left Ear: External ear normal.  Nose: Nose normal.  Mouth/Throat: Oropharynx is clear and moist. No oropharyngeal exudate.  Eyes: Conjunctivae normal are normal. Pupils are equal, round, and reactive to light. Right eye exhibits no discharge. Left eye exhibits no discharge. No scleral icterus.  Neck: Normal range of motion. Neck supple. No tracheal deviation present. Thyromegaly present.  Cardiovascular: Normal rate, regular rhythm, normal heart sounds and intact distal pulses.  Exam reveals no gallop and no friction rub.  No murmur heard. Pulmonary/Chest: Effort normal and breath sounds normal. No respiratory distress. She has no wheezes. She has no rales. She exhibits no tenderness.  Musculoskeletal: She exhibits no edema and no tenderness.       Left  foot: She exhibits decreased range of motion, tenderness and swelling.  Lymphadenopathy:    She has no cervical adenopathy.  Neurological: She is alert and oriented to person, place, and time. No cranial nerve deficit. She exhibits normal muscle tone. Coordination normal.  Skin: Skin is warm and dry. No rash noted. She is not diaphoretic. No erythema. No pallor.  Psychiatric: She has a normal mood and affect. Her behavior is normal. Judgment and thought content normal.          Assessment & Plan:

## 2013-01-16 ENCOUNTER — Ambulatory Visit: Payer: Self-pay | Admitting: Internal Medicine

## 2013-01-17 ENCOUNTER — Telehealth: Payer: Self-pay | Admitting: Internal Medicine

## 2013-01-17 NOTE — Telephone Encounter (Signed)
US thyroid showed multinodular goiter.

## 2013-01-18 ENCOUNTER — Encounter: Payer: Self-pay | Admitting: Internal Medicine

## 2013-01-22 ENCOUNTER — Encounter: Payer: Self-pay | Admitting: Internal Medicine

## 2013-01-25 ENCOUNTER — Encounter: Payer: Self-pay | Admitting: Internal Medicine

## 2013-02-02 ENCOUNTER — Other Ambulatory Visit: Payer: Self-pay

## 2013-02-25 DIAGNOSIS — E042 Nontoxic multinodular goiter: Secondary | ICD-10-CM | POA: Insufficient documentation

## 2013-05-15 ENCOUNTER — Encounter: Payer: Self-pay | Admitting: Internal Medicine

## 2013-05-15 ENCOUNTER — Ambulatory Visit (INDEPENDENT_AMBULATORY_CARE_PROVIDER_SITE_OTHER): Payer: Managed Care, Other (non HMO) | Admitting: Internal Medicine

## 2013-05-15 ENCOUNTER — Other Ambulatory Visit (HOSPITAL_COMMUNITY)
Admission: RE | Admit: 2013-05-15 | Discharge: 2013-05-15 | Disposition: A | Discharge status: Home / Self Care | Payer: Managed Care, Other (non HMO) | Source: Ambulatory Visit | Attending: Internal Medicine | Admitting: Internal Medicine

## 2013-05-15 VITALS — BP 110/80 | HR 80 | Temp 98.3°F | Ht 65.0 in | Wt 303.0 lb

## 2013-05-15 DIAGNOSIS — Z0000 Encounter for general adult medical examination without abnormal findings: Secondary | ICD-10-CM

## 2013-05-15 DIAGNOSIS — Z1151 Encounter for screening for human papillomavirus (HPV): Secondary | ICD-10-CM | POA: Insufficient documentation

## 2013-05-15 DIAGNOSIS — I10 Essential (primary) hypertension: Secondary | ICD-10-CM

## 2013-05-15 DIAGNOSIS — Z01419 Encounter for gynecological examination (general) (routine) without abnormal findings: Secondary | ICD-10-CM | POA: Insufficient documentation

## 2013-05-15 DIAGNOSIS — N9089 Other specified noninflammatory disorders of vulva and perineum: Secondary | ICD-10-CM | POA: Insufficient documentation

## 2013-05-15 DIAGNOSIS — N390 Urinary tract infection, site not specified: Secondary | ICD-10-CM

## 2013-05-15 LAB — POCT URINALYSIS DIPSTICK
Bilirubin, UA: NEGATIVE
Glucose, UA: NEGATIVE
Ketones, UA: NEGATIVE
Nitrite, UA: NEGATIVE
Protein, UA: NEGATIVE
Spec Grav, UA: 1.02
Urobilinogen, UA: 0.2
pH, UA: 7

## 2013-05-15 LAB — HM PAP SMEAR: HM Pap smear: NEGATIVE

## 2013-05-15 MED ORDER — HYDROCHLOROTHIAZIDE 25 MG PO TABS: 25.0000 mg | ORAL_TABLET | Freq: Every day | ORAL | Status: DC

## 2013-05-15 NOTE — Assessment & Plan Note (Signed)
Papular lesion of right labia most consistent with benign polyp, however will bring pt back to recheck in 1 month. If any changes discussed referral to GYN for resection.

## 2013-05-15 NOTE — Progress Notes (Signed)
Subjective:    Patient ID: [name], female    DOB: 1971-06-16, 42 y.o.   MRN: [id number]  HPI 42YO female with obesity and hypertension presents for annual exam. Doing well. Scheduled for ankle surgery in July 2014. Scheduled for sleep study next month. Planning for bariatric evaluation after ankle surgery. No new concerns today. Evaluation with endocrinologist including biopsy of goiter reportedly normal. Stopped thyroid medication.  Outpatient Encounter Prescriptions as of 05/15/2013  Medication Sig Dispense Refill  . hydrochlorothiazide (HYDRODIURIL) 25 MG tablet Take 1 tablet (25 mg total) by mouth daily.  90 tablet  4  . meloxicam (MOBIC) 15 MG tablet Take 1 tablet (15 mg total) by mouth daily.  30 tablet  3  . [DISCONTINUED] hydrochlorothiazide (HYDRODIURIL) 25 MG tablet Take 1 tablet (25 mg total) by mouth daily.  90 tablet  4   No facility-administered encounter medications on file as of 05/15/2013.   BP 110/80  Pulse 80  Temp(Src) 98.3 F (36.8 C) (Oral)  Ht 5\' 5"  (1.651 m)  Wt 303 lb (137.44 kg)  BMI 50.42 kg/m2  SpO2 96%  LMP 05/01/2013  Review of Systems  Constitutional: Negative for fever, chills, appetite change, fatigue and unexpected weight change.  HENT: Negative for ear pain, congestion, sore throat, trouble swallowing, neck pain, voice change and sinus pressure.   Eyes: Negative for visual disturbance.  Respiratory: Negative for cough, shortness of breath, wheezing and stridor.   Cardiovascular: Negative for chest pain, palpitations and leg swelling.  Gastrointestinal: Negative for nausea, vomiting, abdominal pain, diarrhea, constipation, blood in stool, abdominal distention and anal bleeding.  Genitourinary: Negative for dysuria and flank pain.  Musculoskeletal: Positive for arthralgias. Negative for myalgias and gait problem.  Skin: Negative for color change and rash.  Neurological: Negative for dizziness and headaches.  Hematological: Negative for  adenopathy. Does not bruise/bleed easily.  Psychiatric/Behavioral: Negative for suicidal ideas, sleep disturbance and dysphoric mood. The patient is not nervous/anxious.        Objective:   Physical Exam  Constitutional: She is oriented to person, place, and time. She appears well-developed and well-nourished. No distress.  HENT:  Head: Normocephalic and atraumatic.  Right Ear: External ear normal.  Left Ear: External ear normal.  Nose: Nose normal.  Mouth/Throat: Oropharynx is clear and moist. No oropharyngeal exudate.  Eyes: Conjunctivae are normal. Pupils are equal, round, and reactive to light. Right eye exhibits no discharge. Left eye exhibits no discharge. No scleral icterus.  Neck: Normal range of motion. Neck supple. No tracheal deviation present. No thyromegaly present.  Cardiovascular: Normal rate, regular rhythm, normal heart sounds and intact distal pulses.  Exam reveals no gallop and no friction rub.   No murmur heard. Pulmonary/Chest: Effort normal and breath sounds normal. No respiratory distress. She has no wheezes. She has no rales. She exhibits no tenderness.  Abdominal: Soft. Bowel sounds are normal. She exhibits no distension and no mass. There is no tenderness. There is no rebound and no guarding.  Genitourinary: Rectum normal, vagina normal and uterus normal.    No breast swelling, tenderness, discharge or bleeding. Pelvic exam was performed with patient supine. There is lesion on the right labia. There is no rash or tenderness on the right labia. There is no rash, tenderness or lesion on the left labia. Uterus is not enlarged and not tender. Cervix exhibits no motion tenderness, no discharge and no friability. Right adnexum displays no mass, no tenderness and no fullness. Left adnexum displays no mass, no  tenderness and no fullness. No erythema or tenderness around the vagina. No vaginal discharge found.  Musculoskeletal: Normal range of motion. She exhibits no edema  and no tenderness.  Lymphadenopathy:    She has no cervical adenopathy.  Neurological: She is alert and oriented to person, place, and time. No cranial nerve deficit. She exhibits normal muscle tone. Coordination normal.  Skin: Skin is warm and dry. No rash noted. She is not diaphoretic. No erythema. No pallor.  Psychiatric: She has a normal mood and affect. Her behavior is normal. Judgment and thought content normal.          Assessment & Plan:

## 2013-05-15 NOTE — Assessment & Plan Note (Signed)
General medical exam normal today including breast and pelvic exam. PAP pending. Labs from 12/2012 reviewed. Will check urine microalbumin today. Mammogram ordered. Immunizations UTD. Sleep study pending. Bariatric referral pending.

## 2013-05-17 LAB — URINE CULTURE
Colony Count: NO GROWTH
Organism ID, Bacteria: NO GROWTH

## 2013-05-21 ENCOUNTER — Telehealth: Payer: Self-pay | Admitting: Internal Medicine

## 2013-05-21 NOTE — Telephone Encounter (Signed)
Patient returning [name]'s call in reference to the urine. Please advise. Pt stated it is ok to leave a message on her cell, due to being in meetings.

## 2013-05-21 NOTE — Telephone Encounter (Signed)
Spoke with pt, she had read myChart message regarding urine culture results, will call back tomorrow to schedule an appointment for urine recheck.

## 2013-05-29 ENCOUNTER — Ambulatory Visit: Payer: Self-pay | Admitting: Internal Medicine

## 2013-05-29 ENCOUNTER — Telehealth: Payer: Self-pay | Admitting: Internal Medicine

## 2013-05-29 NOTE — Telephone Encounter (Signed)
Mammogram from 05/29/2013 requested additional views for nodular area in outer left breast. Has this been scheduled?

## 2013-05-30 ENCOUNTER — Ambulatory Visit: Payer: Self-pay | Admitting: Internal Medicine

## 2013-05-30 ENCOUNTER — Telehealth: Payer: Self-pay | Admitting: Internal Medicine

## 2013-05-30 NOTE — Telephone Encounter (Signed)
Trying to reach pt re mammogram. No answer at home/cell phone  Mammogram showed 1.5cm mass in left breast.  Will try to reach her again tomorrow.

## 2013-05-30 NOTE — Telephone Encounter (Signed)
Yes, she just had this done this morning. They called her yesterday and scheduled it, she had an ultrasound done on her left breast.

## 2013-05-31 ENCOUNTER — Other Ambulatory Visit (INDEPENDENT_AMBULATORY_CARE_PROVIDER_SITE_OTHER): Payer: Managed Care, Other (non HMO)

## 2013-05-31 ENCOUNTER — Telehealth: Payer: Self-pay | Admitting: *Deleted

## 2013-05-31 DIAGNOSIS — R319 Hematuria, unspecified: Secondary | ICD-10-CM

## 2013-05-31 LAB — POCT URINALYSIS DIPSTICK
Bilirubin, UA: NEGATIVE
Glucose, UA: NEGATIVE
Ketones, UA: NEGATIVE
Leukocytes, UA: NEGATIVE
Nitrite, UA: NEGATIVE
Protein, UA: NEGATIVE
Spec Grav, UA: 1.02
Urobilinogen, UA: 0.2
pH, UA: 5

## 2013-05-31 NOTE — Telephone Encounter (Signed)
Would you like a urine culture?  

## 2013-05-31 NOTE — Telephone Encounter (Signed)
Pt came in to get labs and wanted you to call her back  Best number is [phone]

## 2013-05-31 NOTE — Telephone Encounter (Signed)
Yes, please do a urine culture.

## 2013-05-31 NOTE — Telephone Encounter (Signed)
I attempted to call pt again with no answer on preferred contact number.  Can you try to contact her later this afternoon? I want to set up surgical referral as mammogram showed 1.5cm solid lesion in her left breast, and this will need to be biopsied. Can you find out who she would want to see?

## 2013-06-02 LAB — URINE CULTURE: Colony Count: 45000

## 2013-06-03 ENCOUNTER — Telehealth: Payer: Self-pay | Admitting: Emergency Medicine

## 2013-06-06 ENCOUNTER — Other Ambulatory Visit (INDEPENDENT_AMBULATORY_CARE_PROVIDER_SITE_OTHER): Payer: Managed Care, Other (non HMO)

## 2013-06-06 DIAGNOSIS — N390 Urinary tract infection, site not specified: Secondary | ICD-10-CM

## 2013-06-06 LAB — POCT URINALYSIS DIPSTICK
Bilirubin, UA: NEGATIVE
Glucose, UA: NEGATIVE
Ketones, UA: NEGATIVE
Nitrite, UA: NEGATIVE
Protein, UA: NEGATIVE
Spec Grav, UA: 1.02
Urobilinogen, UA: 0.2
pH, UA: 6

## 2013-06-07 ENCOUNTER — Encounter: Payer: Self-pay | Admitting: Internal Medicine

## 2013-06-08 LAB — URINE CULTURE: Colony Count: 1000

## 2013-06-10 ENCOUNTER — Other Ambulatory Visit: Payer: Self-pay | Admitting: Internal Medicine

## 2013-06-10 DIAGNOSIS — R319 Hematuria, unspecified: Secondary | ICD-10-CM

## 2013-06-11 ENCOUNTER — Telehealth: Payer: Self-pay | Admitting: Internal Medicine

## 2013-06-11 NOTE — Telephone Encounter (Signed)
Let's bring her in for a visit. We may need to do more evaluation for a kidney stone.

## 2013-06-11 NOTE — Telephone Encounter (Signed)
Offered appts with you and PA on multiple dates but pt couldn't come to any of the appt's that you both had available due to work and going to [employer], pt will call back Monday to scheduled an appt once she returns, I advise pt if sxs worsen or develop new sxs to go to UC or ER while on vacation, pt verbalized understanding

## 2013-06-11 NOTE — Telephone Encounter (Signed)
Patient called stating she is having some issues with lower back pain, wondering if this can be related to the issue with the blood in her urine.

## 2013-06-12 ENCOUNTER — Encounter: Payer: Self-pay | Admitting: Internal Medicine

## 2013-06-12 ENCOUNTER — Ambulatory Visit (INDEPENDENT_AMBULATORY_CARE_PROVIDER_SITE_OTHER): Payer: Managed Care, Other (non HMO) | Admitting: Internal Medicine

## 2013-06-12 ENCOUNTER — Ambulatory Visit (INDEPENDENT_AMBULATORY_CARE_PROVIDER_SITE_OTHER): Payer: Managed Care, Other (non HMO) | Admitting: General Surgery

## 2013-06-12 ENCOUNTER — Encounter: Payer: Self-pay | Admitting: General Surgery

## 2013-06-12 VITALS — BP 130/70 | HR 76 | Resp 14 | Ht 65.0 in | Wt 308.0 lb

## 2013-06-12 VITALS — BP 128/70 | HR 84 | Temp 98.2°F | Wt 308.0 lb

## 2013-06-12 DIAGNOSIS — R920 Mammographic microcalcification found on diagnostic imaging of breast: Secondary | ICD-10-CM

## 2013-06-12 DIAGNOSIS — M549 Dorsalgia, unspecified: Secondary | ICD-10-CM | POA: Insufficient documentation

## 2013-06-12 DIAGNOSIS — E669 Obesity, unspecified: Secondary | ICD-10-CM

## 2013-06-12 DIAGNOSIS — N63 Unspecified lump in unspecified breast: Secondary | ICD-10-CM

## 2013-06-12 DIAGNOSIS — R109 Unspecified abdominal pain: Secondary | ICD-10-CM

## 2013-06-12 HISTORY — PX: BREAST BIOPSY: SHX20

## 2013-06-12 MED ORDER — HYDROCODONE-ACETAMINOPHEN 5-325 MG PO TABS: 1.0000 | ORAL_TABLET | Freq: Four times a day (QID) | ORAL | Status: DC | PRN

## 2013-06-12 NOTE — Progress Notes (Signed)
Patient ID: LIBRA GATZ, female   DOB: Jul 29, 1971, 42 y.o.   MRN: [id number]  Chief Complaint  Patient presents with  . Breast Problem    New Patient Category 4 mammo    HPI [name] is a 42 y.o. female Who presents for a breast evaluation. The most recent mammogram was done on 05/29/13 with a birad category 0. Additional views of the left breast were done on 05/30/13 with a category 4. Left breast ultrasound was done at that time.  Patient does perform regular self breast checks and gets regular mammograms done. She has a maternal grandmother with a history of breast cancer. The only complaint is she has noticed some left breast itching around the nipple area. She states she has noticed this approximately 2 months.   HPI  Past Medical History  Diagnosis Date  . Hypertension   . Thyroid disease   . Blood in urine 2014  . Breast mass 2013    Past Surgical History  Procedure Laterality Date  . Cesarean section  2007  . Dilation and curettage of uterus    . Breast biopsy Right 2013    benign - fatty tissue    Family History  Problem Relation Age of Onset  . Arthritis Mother   . Diabetes Father   . Hypertension Father   . Polycystic ovary syndrome Sister   . Cancer Maternal Grandmother     breast    Social History History  Substance Use Topics  . Smoking status: Former Games developer  . Smokeless tobacco: Never Used     Comment: quit 2006  . Alcohol Use: Yes     Comment: rare    Allergies  Allergen Reactions  . Avelox (Moxifloxacin Hcl In Nacl) Palpitations    Current Outpatient Prescriptions  Medication Sig Dispense Refill  . hydrochlorothiazide (HYDRODIURIL) 25 MG tablet Take 1 tablet (25 mg total) by mouth daily.  90 tablet  4  . HYDROcodone-acetaminophen (NORCO/VICODIN) 5-325 MG per tablet Take 1-2 tablets by mouth every 6 (six) hours as needed for pain.  60 tablet  0  . meloxicam (MOBIC) 15 MG tablet Take 1 tablet (15 mg total) by mouth daily.  30 tablet  3    No current facility-administered medications for this visit.    Review of Systems Review of Systems  Constitutional: Negative.   Respiratory: Negative.   Cardiovascular: Negative.     Blood pressure 130/70, pulse 76, resp. rate 14, height 5\' 5"  (1.651 m), weight 308 lb (139.708 kg), last menstrual period 05/24/2013.  Physical Exam Physical Exam  Constitutional: She is oriented to person, place, and time. She appears well-developed and well-nourished.  Eyes: Conjunctivae are normal. No scleral icterus.  Neck: Trachea normal. No mass and no thyromegaly present.  Cardiovascular: Normal rate, regular rhythm, normal heart sounds and normal pulses.   No murmur heard. Pulmonary/Chest: Effort normal and breath sounds normal. Right breast exhibits no inverted nipple, no mass, no nipple discharge, no skin change and no tenderness. Left breast exhibits no inverted nipple, no mass, no nipple discharge, no skin change and no tenderness. Breasts are symmetrical.  Lymphadenopathy:    She has no cervical adenopathy.    She has no axillary adenopathy.  Neurological: She is alert and oriented to person, place, and time.  Skin: Skin is warm and dry.    Data Reviewed  Mammogram and ultrasound reviewed.   Assessment    Right breast shows indeterminate calcifications. Left breast with solid nodule upper  outer quadrant.    Plan    Right breast stereotactic breast biopsy. Left breast core biopsy with ultrasound. Discussed fully with patient. Patient agreeable.     Patient has been scheduled for a right breast stereotactic biopsy at [facility] for 06-17-13 at 2 pm. She will check-in at the [facility] at 1:30 pm. This patient is aware of date, time, and instructions. Patient verbalizes understanding.  CORE BREAST BIOPSY REPORT  Name:  [name] DOB:  May 24, 1971  Vital signs:BP 130/70  Pulse 76  Resp 14  Ht 5\' 5"  (1.651 m)  Wt 308 lb (139.708 kg)  BMI 51.25 kg/m2  LMP  05/24/2013  Lesion: solid mass left breast  Location: Left   2 o'clock position  Local anesthetic:   8ml of 1% Xylocaine/0.5% Marcaine  Prep:  Chloroprep  Device:  14Gauge   Finesse   Ultrasound guidance:  used  Patient tolerance:  Patient tolerated the procedure well   Approach: LM  Clip: used  Dressing: steristrips, telfa, tegaderm  Ice pack applied. Written instructions provided to patient regarding wound care.   Patient advised that patient will be contacted by phone when pathology report is available.  Followup appointment to be scheduled after pathology.   CC: Wynona Dove, MD, Raynaldo Opitz G 06/12/2013, 5:56 PM

## 2013-06-12 NOTE — Patient Instructions (Addendum)
Breast Biopsy  Care After Refer to this sheet in the next few weeks. These instructions provide you with information on caring for yourself after your procedure. Your caregiver may also give you more specific instructions. Your treatment has been planned according to current medical practices, but problems sometimes occur. Call your caregiver if you have any problems or questions after your procedure. HOME CARE INSTRUCTIONS   Only take over-the-counter or prescription medicines for pain, discomfort, or fever as directed by your caregiver.  Do not take aspirin. It can cause bleeding.  Keep stitches dry when bathing.  Protect the biopsy area. Do not let the area get bumped.  Avoid activities that may pull the incision site open until approved by your caregiver. This can include stretching, reaching, exercise, sports, or lifting over 3 pounds.  Resume your usual diet.  Wear a good support bra for as long as directed by your caregiver.  Change any bandages (dressings) as directed by your caregiver.  Do not drink alcohol while taking pain medicine.  Keep all your follow-up appointments with your caregiver. Ask when your test results will be ready. Make sure you get your test results. SEEK MEDICAL CARE IF:   You have redness, swelling, or increasing pain in the biopsy site.  You have a bad smell coming from the biopsy site or dressing.  Your biopsy site breaks open after the stitches (sutures), staples, or skin adhesive strips have been removed.  You have a rash.  You need stronger medicine. SEEK IMMEDIATE MEDICAL CARE IF:   You have a fever.  You have increased bleeding (more than a small spot) from the biopsy site.  You have difficulty breathing.  You have pus coming from the biopsy site. MAKE SURE YOU:  Understand these instructions.  Will watch your condition.  Will get help right away if you are not doing well or get worse. Document Released: 06/24/2005 Document  Revised: 02/27/2012 Document Reviewed: 01/05/2012 [facility] Patient Information 2014 [place], [place].   Breast Biopsy, Stereotactic A stereotactic breast biopsy takes a tissue sample from the breast with a special instrument. This is done when:  The problem (lump, abnormality, mass) can be seen on X-ray, but not felt on physical exam.  Suspicious, small calcium deposits (calcifications) are seen in the breast.  There is a change in shape or appearance of the breast, thickening, or asymmetry on mammogram (breast X-ray).  You have nipple changes (unusual or bloody discharge, crusting, retraction, dimpling).  Your caregiver is making a surgical diagnosis. The biopsy may be done on a special table, with your face down and your breasts placed through openings in the table. Computerized imaging (special form of X-rays) is used. The images are not obtained using regular X-ray film. So, exposure to radiation is reduced. Images are seen through several different angles. The surgeon removes small pieces of the suspicious tissue through a hollow needle. The tissue will be sent to the lab for analysis. The surgeon can look at the pictures right away, rather than wait for an X-ray to be developed. Your caregiver can mark the lesions (abnormal tissue formations) electronically. Then the computer can tell exactly where the problem is, or if it has moved. BENEFITS OF THE PROCEDURE  This is a good way to see if tiny lumps, abnormal looking tissue, or calcium deposits that you cannot feel are cancerous or require further treatment or follow-up.  Needle biopsy is a simple procedure. It may be performed in an outpatient imaging center. This means  you have the procedure and go home the same day, without checking into a hospital.  It is less painful than open surgery. The results are as accurate as when a tissue sample is removed surgically.  The procedure is faster, less expensive, less invasive, does not distort  the breast, and leaves little or no scar.  Breast defects, which can make future mammograms hard to read and interpret, do not remain.  Recovery time is brief. Patients can soon resume their normal activities.  Using VAD (vacuum assisted device) may make it possible to remove entire lesions.  A breast biopsy can indicate if you need surgery, other treatment, or combined treatment. LET YOUR CAREGIVER KNOW ABOUT:  Allergies.  Medications taken, including herbs, eye drops, over-the-counter medications, and creams.  Use of steroids (by mouth or creams).  Previous problems with anesthetics or numbing medication.  If you are taking blood thinner medications or aspirin.  Possibility of pregnancy, if this applies.  History of blood clots (thrombophlebitis).  History of bleeding or blood problems.  Previous surgery.  Other health problems. RISKS AND COMPLICATIONS  Infection (germ growing in the wound). This can often be treated with antibiotics.  Bleeding, following surgery. Your surgeon takes every precaution to keep this from happening.  There is some concern that if a cancerous mass is present, cancer cells might be spread by the needle. Whether this actually happens is not known. It does not appear to be a significant risk.  X-ray guided breast biopsy is not infallible (not always correct). The problem may be missed or the extent of the problem may be underestimated. This would mean the biopsy did not manage to remove a piece of the diseased tissue or enough of the diseased tissue.  Lesions present, with calcium deposits scattered throughout the breast, are difficult to target by stereotactic method. Those lesions near the chest wall also are hard to learn about by this method. If the mammogram shows only a vague change in tissue density, but no definite mass or nodule, the X-ray guided method may not be successful. Occasionally, even after a successful biopsy, the tissue diagnosis  remains uncertain. A surgical biopsy will be needed, if abnormal or precancerous cells are found on core biopsy.  Altering or deforming of the breast.  Unable to find, or missing the lesion.  Rarely, the needle may go through the chest wall into the lung area. TWO BIOPSY INSTRUMENTS MAY BE USED IN THE PROCEDURE The conventional biopsy device (core needle biopsy device) consists of an inner needle with a trough extending from it at one end, and an overlying sheath. It is attached to a spring-loaded mechanism that propels it forward. The trough fills with tissue. The outer sheath instantly moves forward to cut the tissue and keep it in the trough. Each sample is obtained in a fraction of a second. It is necessary to withdraw the needle after each sample is taken to collect the tissue.  A newer type of instrument, the VAD (vacuum assisted device), uses vacuum pressure to pull breast tissue into a needle and remove it. The needle does not need to be withdrawn after each sampling. Another advantage is that biopsies are obtained in an orderly manner, by rotating the device. This helps make sure that the entire area of interest will be sampled. When using the automated core biopsy needle, sampling is more random.  FOR COMFORT DURING THE TEST  Relax as much as possible.  Try to follow instructions, to speed up  the test.  Let your caregiver know if you are uncomfortable, anxious, or in pain. PROCEDURE  You are awake during the procedure, and you go home the same day (outpatient). A specially trained radiologist will do this procedure. First, the skin is cleansed. Then, it is injected with a local anesthetic. A small nick is made in the skin, and the tip of the biopsy needle is put into the calculated site of the lesion. A special mammography machine uses ionizing radiation to help guide the radiologist's instrument to the site of the abnormal growth. At this point, stereo images are again obtained, to  confirm that the needle tip is at the problem area. Usually 5 to 10 samples are collected when doing a core biopsy. At least 12 are collected when using the vacuum assisted device (VAD). Then, a final set of images is obtained. If they show that the lesion has been mostly or completely removed, a small clip is left at the biopsy site. This is so that it can be easily located, in case the lesion turns out to be cancer. Afterward, the skin opening is stitched (sutured) or taped closed, and covered with a dressing. Your caregiver may apply a pressure dressing and an ice pack, to prevent bleeding and swelling in the breast.  X-ray guided breast biopsy can take 30 minutes to 1 hour, or more. The X-rays usually have no side effects, and no radiation remains in your body. There is usually little or no pain. Usually no scar is left from the tiny skin incision. Many women find that the major discomfort of the procedure is from lying on their stomach, or staying in 1 position for the length of the procedure. This discomfort may be reduced by carefully placed cushions. You should wear a good support bra to the procedure. You will be asked to remove jewelry, dentures, eye glasses, metal objects, or clothing that might interfere with the X-ray images. You may want to have someone with you, to take you home after the procedure. AFTER THE PROCEDURE   After surgery, if you are doing well and have no problems, you will be allowed to go home.  You may resume your regular diet, or as directed by your caregiver. HOME CARE INSTRUCTIONS   Follow your caregiver's recommendations for medications, care of the biopsy site, follow-up appointments, and further treatment.  Only take over-the-counter or prescription medicines for pain, discomfort, or fever as directed by your caregiver.  An ice pack applied to the affected area may help with discomfort and keep the swelling down.  Change dressings as directed.  Wear a good  support bra for as long as your caregiver recommends.  Avoid strenuous activity for at least 24 hours, or as advised by your caregiver. Finding out the results of your test Not all test results are available during your visit. If your test results are not back during the visit, make an appointment with your caregiver to find out the results. Do not assume everything is normal if you have not heard from your caregiver or the medical facility. It is important for you to follow up on all of your test results.  SEEK MEDICAL CARE IF:   You develop a rash.  You have problems with your medicines.  You become lightheaded or dizzy. SEEK IMMEDIATE MEDICAL CARE IF:   There is increased bleeding (more than a small spot) from the biopsy site.  You notice redness, swelling, or increasing pain in the wound.  Pus  is coming from the wound.  You have a fever.  You notice a bad smell coming from the wound or dressing.  You develop shortness of breath.  You develop chest pain.  You pass out. Document Released: 09/03/2003 Document Revised: 02/27/2012 Document Reviewed: 10/09/2009 [facility] Patient Information 2014 [place], [place].  Patient has been scheduled for a right breast stereotactic biopsy at [facility] for 06-17-13 at 2 pm. She will check-in at the [facility] at 1:30 pm. This patient is aware of date, time, and instructions. Patient verbalizes understanding.

## 2013-06-12 NOTE — Telephone Encounter (Signed)
Patient came in today and was seen by Dr. Dan Humphreys

## 2013-06-12 NOTE — Progress Notes (Signed)
Subjective:    Patient ID: [name], female    DOB: 1971/01/07, 42 y.o.   MRN: [id number]  HPI 42YO female presents for acute visit complaining of several days of gradually worsening right flank pain. No fever, chills, dysuria,gross hematuria, urgency or frequency. Taking meloxicam with minimal improvement. Pain not changed with movement or activity.   Outpatient Encounter Prescriptions as of 06/12/2013  Medication Sig Dispense Refill  . hydrochlorothiazide (HYDRODIURIL) 25 MG tablet Take 1 tablet (25 mg total) by mouth daily.  90 tablet  4  . meloxicam (MOBIC) 15 MG tablet Take 1 tablet (15 mg total) by mouth daily.  30 tablet  3  . HYDROcodone-acetaminophen (NORCO/VICODIN) 5-325 MG per tablet Take 1-2 tablets by mouth every 6 (six) hours as needed for pain.  60 tablet  0   No facility-administered encounter medications on file as of 06/12/2013.   BP 128/70  Pulse 84  Temp(Src) 98.2 F (36.8 C) (Oral)  Wt 308 lb (139.708 kg)  BMI 51.25 kg/m2  SpO2 97%  LMP 05/01/2013  Review of Systems  Constitutional: Negative for fever, chills, appetite change, fatigue and unexpected weight change.  HENT: Negative for ear pain, congestion, sore throat, trouble swallowing, neck pain, voice change and sinus pressure.   Eyes: Negative for visual disturbance.  Respiratory: Negative for cough, shortness of breath, wheezing and stridor.   Cardiovascular: Negative for chest pain, palpitations and leg swelling.  Gastrointestinal: Negative for nausea, vomiting, abdominal pain, diarrhea, constipation, blood in stool, abdominal distention and anal bleeding.  Genitourinary: Positive for flank pain. Negative for dysuria, urgency, frequency, hematuria, vaginal bleeding, difficulty urinating, vaginal pain and pelvic pain.  Musculoskeletal: Negative for myalgias, arthralgias and gait problem.  Skin: Negative for color change and rash.  Neurological: Negative for dizziness and headaches.  Hematological:  Negative for adenopathy. Does not bruise/bleed easily.  Psychiatric/Behavioral: Negative for suicidal ideas, sleep disturbance and dysphoric mood. The patient is not nervous/anxious.        Objective:   Physical Exam  Constitutional: She is oriented to person, place, and time. She appears well-developed and well-nourished. No distress.  HENT:  Head: Normocephalic and atraumatic.  Right Ear: External ear normal.  Left Ear: External ear normal.  Nose: Nose normal.  Mouth/Throat: Oropharynx is clear and moist. No oropharyngeal exudate.  Eyes: Conjunctivae are normal. Pupils are equal, round, and reactive to light. Right eye exhibits no discharge. Left eye exhibits no discharge. No scleral icterus.  Neck: Normal range of motion. Neck supple. No tracheal deviation present. No thyromegaly present.  Cardiovascular: Normal rate, regular rhythm, normal heart sounds and intact distal pulses.  Exam reveals no gallop and no friction rub.   No murmur heard. Pulmonary/Chest: Effort normal and breath sounds normal. No accessory muscle usage. Not tachypneic. No respiratory distress. She has no decreased breath sounds. She has no wheezes. She has no rhonchi. She has no rales. She exhibits no tenderness.  Musculoskeletal: Normal range of motion. She exhibits no edema and no tenderness.       Lumbar back: She exhibits tenderness and pain. She exhibits normal range of motion, no bony tenderness, no swelling and no edema.       Back:  Lymphadenopathy:    She has no cervical adenopathy.  Neurological: She is alert and oriented to person, place, and time. No cranial nerve deficit. She exhibits normal muscle tone. Coordination normal.  Skin: Skin is warm and dry. No rash noted. She is not diaphoretic. No erythema. No pallor.  Psychiatric: She has a normal mood and affect. Her behavior is normal. Judgment and thought content normal.          Assessment & Plan:

## 2013-06-12 NOTE — Assessment & Plan Note (Signed)
Right flank pain and hematuria. Will get CT abd/pel without contrast to evaluate for nephrolithiasis.

## 2013-06-13 ENCOUNTER — Telehealth: Payer: Self-pay | Admitting: *Deleted

## 2013-06-13 LAB — HM MAMMOGRAPHY: HM Mammogram: ABNORMAL

## 2013-06-13 NOTE — Telephone Encounter (Signed)
Patient was contacted today at the request of Dr. Evette Cristal to let her know that left breast biopsy at 2 o'clock was benign. This patient verbalizes understanding and was reminded of stereo biopsy on Monday, 06-17-13.

## 2013-06-14 ENCOUNTER — Encounter: Payer: Self-pay | Admitting: Internal Medicine

## 2013-06-17 ENCOUNTER — Ambulatory Visit: Payer: Self-pay | Admitting: General Surgery

## 2013-06-17 DIAGNOSIS — R920 Mammographic microcalcification found on diagnostic imaging of breast: Secondary | ICD-10-CM

## 2013-06-17 HISTORY — PX: BREAST BIOPSY: SHX20

## 2013-06-17 LAB — PATHOLOGY

## 2013-06-18 ENCOUNTER — Telehealth: Payer: Self-pay | Admitting: *Deleted

## 2013-06-18 ENCOUNTER — Encounter: Payer: Self-pay | Admitting: General Surgery

## 2013-06-18 LAB — PATHOLOGY REPORT

## 2013-06-18 NOTE — Telephone Encounter (Signed)
Notified patient as instructed, right breast stereo biopsy benign no cancer per Dr Evette Cristal, patient pleased. Discussed follow-up appointments, patient agrees 5 month right mammo and ov with left breast u/s

## 2013-06-19 ENCOUNTER — Ambulatory Visit: Payer: Self-pay | Admitting: Internal Medicine

## 2013-06-19 ENCOUNTER — Telehealth: Payer: Self-pay | Admitting: Internal Medicine

## 2013-06-19 NOTE — Telephone Encounter (Signed)
CT abdomen showed tiny kidney stone, 2mm, in left ureter, which was non-obstructing. No findings on right side to explain pain. Have symptoms improved?

## 2013-06-20 ENCOUNTER — Encounter: Payer: Self-pay | Admitting: General Surgery

## 2013-06-20 ENCOUNTER — Ambulatory Visit: Payer: Managed Care, Other (non HMO) | Admitting: Internal Medicine

## 2013-06-20 NOTE — Telephone Encounter (Signed)
Spoke with patient, her symptoms has not really improved, it comes and goes.

## 2013-06-20 NOTE — Telephone Encounter (Signed)
We should see her in a follow up visit then, next week.

## 2013-06-20 NOTE — Telephone Encounter (Signed)
Patient confirmed appointment for next week

## 2013-06-27 ENCOUNTER — Ambulatory Visit (INDEPENDENT_AMBULATORY_CARE_PROVIDER_SITE_OTHER): Payer: Managed Care, Other (non HMO) | Admitting: *Deleted

## 2013-06-27 ENCOUNTER — Encounter: Payer: Self-pay | Admitting: Internal Medicine

## 2013-06-27 ENCOUNTER — Ambulatory Visit (INDEPENDENT_AMBULATORY_CARE_PROVIDER_SITE_OTHER): Payer: Managed Care, Other (non HMO) | Admitting: Internal Medicine

## 2013-06-27 VITALS — BP 102/70 | HR 95 | Temp 98.3°F | Wt 305.0 lb

## 2013-06-27 DIAGNOSIS — N9089 Other specified noninflammatory disorders of vulva and perineum: Secondary | ICD-10-CM

## 2013-06-27 DIAGNOSIS — R109 Unspecified abdominal pain: Secondary | ICD-10-CM

## 2013-06-27 DIAGNOSIS — N63 Unspecified lump in unspecified breast: Secondary | ICD-10-CM

## 2013-06-27 DIAGNOSIS — R3129 Other microscopic hematuria: Secondary | ICD-10-CM | POA: Insufficient documentation

## 2013-06-27 DIAGNOSIS — N393 Stress incontinence (female) (male): Secondary | ICD-10-CM | POA: Insufficient documentation

## 2013-06-27 DIAGNOSIS — R319 Hematuria, unspecified: Secondary | ICD-10-CM

## 2013-06-27 DIAGNOSIS — G4733 Obstructive sleep apnea (adult) (pediatric): Secondary | ICD-10-CM

## 2013-06-27 DIAGNOSIS — E669 Obesity, unspecified: Secondary | ICD-10-CM

## 2013-06-27 DIAGNOSIS — N23 Unspecified renal colic: Secondary | ICD-10-CM | POA: Insufficient documentation

## 2013-06-27 MED ORDER — CYCLOBENZAPRINE HCL 5 MG PO TABS: 5.0000 mg | ORAL_TABLET | Freq: Three times a day (TID) | ORAL | Status: DC | PRN

## 2013-06-27 NOTE — Assessment & Plan Note (Signed)
Patient recently diagnosed with obstructive sleep apnea. CPAP titration study is pending. Will follow.

## 2013-06-27 NOTE — Assessment & Plan Note (Signed)
Wt Readings from Last 3 Encounters:  06/27/13 305 lb (138.347 kg)  06/12/13 308 lb (139.708 kg)  06/12/13 308 lb (139.708 kg)   Encouraged healthy diet. Physical activity is limited by ankle pain. Patient has considered bariatric surgery. Will discuss further after her upcoming ankle surgery.

## 2013-06-27 NOTE — Assessment & Plan Note (Signed)
Plan to evaluate next visit. Pt would like to defer for now, given other ongoing issues.

## 2013-06-27 NOTE — Assessment & Plan Note (Signed)
Status post recent evaluation by urology. Planned for cystoscopy in the future after her upcoming surgery.

## 2013-06-27 NOTE — Progress Notes (Signed)
Subjective:    Patient ID: [name], female    DOB: November 30, 1971, 42 y.o.   MRN: [id number]  HPI 42 year old female with history of obesity, hypertension, chronic ankle pain, and recent episode of right mid back pain presents for followup. She reports that pain has been persistent. CT scan of the abdomen showed no abnormalities. She was evaluated by urology for hematuria however this was found to be mild. She reports that they discussed potential cystoscopy in the future. Pain in her right mid back is persistent. She reports that it comes and goes. It was improved with the use of meloxicam however she's been unable to take this medication because of her upcoming ankle surgery. Patient has not limited her physical activity.  She also reports she was recently evaluated by general surgeon for abnormal mammogram. She underwent breast biopsy which showed benign tissue.  She was also evaluated by sleep specialist with sleep study to look for obstructive sleep apnea. She reports this showed mild obstructive sleep apnea. CPAP titration study is pending.  Outpatient Encounter Prescriptions as of 06/27/2013  Medication Sig Dispense Refill  . hydrochlorothiazide (HYDRODIURIL) 25 MG tablet Take 1 tablet (25 mg total) by mouth daily.  90 tablet  4  . cyclobenzaprine (FLEXERIL) 5 MG tablet Take 1-2 tablets (5-10 mg total) by mouth 3 (three) times daily as needed for muscle spasms.  90 tablet  1  . HYDROcodone-acetaminophen (NORCO/VICODIN) 5-325 MG per tablet Take 1-2 tablets by mouth every 6 (six) hours as needed for pain.  60 tablet  0  . meloxicam (MOBIC) 15 MG tablet Take 1 tablet (15 mg total) by mouth daily.  30 tablet  3   No facility-administered encounter medications on file as of 06/27/2013.   BP 102/70  Pulse 95  Temp(Src) 98.3 F (36.8 C) (Oral)  Wt 305 lb (138.347 kg)  BMI 50.75 kg/m2  SpO2 96%  LMP 05/24/2013  Review of Systems  Constitutional: Negative for fever, chills, appetite  change, fatigue and unexpected weight change.  HENT: Negative for ear pain, congestion, sore throat, trouble swallowing, neck pain, voice change and sinus pressure.   Eyes: Negative for visual disturbance.  Respiratory: Negative for cough, shortness of breath, wheezing and stridor.   Cardiovascular: Negative for chest pain, palpitations and leg swelling.  Gastrointestinal: Negative for nausea, vomiting, abdominal pain, diarrhea, constipation, blood in stool, abdominal distention and anal bleeding.  Genitourinary: Negative for dysuria and flank pain.  Musculoskeletal: Negative for myalgias, arthralgias and gait problem.  Skin: Negative for color change and rash.  Neurological: Negative for dizziness and headaches.  Hematological: Negative for adenopathy. Does not bruise/bleed easily.  Psychiatric/Behavioral: Negative for suicidal ideas, sleep disturbance and dysphoric mood. The patient is not nervous/anxious.        Objective:   Physical Exam  Constitutional: She is oriented to person, place, and time. She appears well-developed and well-nourished. No distress.  HENT:  Head: Normocephalic and atraumatic.  Right Ear: External ear normal.  Left Ear: External ear normal.  Nose: Nose normal.  Mouth/Throat: Oropharynx is clear and moist.  Eyes: Conjunctivae are normal. Pupils are equal, round, and reactive to light. Right eye exhibits no discharge. Left eye exhibits no discharge. No scleral icterus.  Neck: Normal range of motion. Neck supple. No tracheal deviation present. No thyromegaly present.  Pulmonary/Chest: Effort normal. No accessory muscle usage. Not tachypneic. She has no decreased breath sounds. She has no rhonchi.  Musculoskeletal: Normal range of motion. She exhibits no edema and  no tenderness.       Thoracic back: She exhibits tenderness, pain and spasm. She exhibits normal range of motion and no bony tenderness.       Back:  Lymphadenopathy:    She has no cervical adenopathy.   Neurological: She is alert and oriented to person, place, and time. No cranial nerve deficit. She exhibits normal muscle tone. Coordination normal.  Skin: Skin is warm and dry. No rash noted. She is not diaphoretic. No erythema. No pallor.  Psychiatric: She has a normal mood and affect. Her behavior is normal. Judgment and thought content normal.          Assessment & Plan:

## 2013-06-27 NOTE — Patient Instructions (Signed)
Patient here today for follow up post right breast biopsy. Minimal bruising noted.  The patient is aware that a heating pad may be used for comfort as needed.  Aware of pathology. Follow up as scheduled. 

## 2013-06-27 NOTE — Assessment & Plan Note (Signed)
Pt with persistent right mid back pain. Urology evaluation normal. Symptoms most consistent with spasm of paraspinal muscles. Improved with NSAIDS, however unable to take them currently because of upcoming surgery. Will try starting Flexeril. If no improvement, discussed trying lidoderm patch.

## 2013-06-27 NOTE — Assessment & Plan Note (Signed)
Reviewed recent pathology results showing benign tissue.

## 2013-07-15 ENCOUNTER — Encounter: Payer: Self-pay | Admitting: Internal Medicine

## 2013-07-16 ENCOUNTER — Encounter: Payer: Self-pay | Admitting: General Surgery

## 2013-07-16 ENCOUNTER — Encounter: Payer: Self-pay | Admitting: Internal Medicine

## 2013-07-22 ENCOUNTER — Encounter: Payer: Self-pay | Admitting: Internal Medicine

## 2013-07-29 ENCOUNTER — Ambulatory Visit (INDEPENDENT_AMBULATORY_CARE_PROVIDER_SITE_OTHER): Payer: Managed Care, Other (non HMO) | Admitting: Internal Medicine

## 2013-07-29 ENCOUNTER — Encounter: Payer: Self-pay | Admitting: Internal Medicine

## 2013-07-29 VITALS — BP 116/70 | HR 92 | Temp 98.3°F | Wt 298.0 lb

## 2013-07-29 DIAGNOSIS — H6690 Otitis media, unspecified, unspecified ear: Secondary | ICD-10-CM

## 2013-07-29 DIAGNOSIS — E669 Obesity, unspecified: Secondary | ICD-10-CM

## 2013-07-29 DIAGNOSIS — N9089 Other specified noninflammatory disorders of vulva and perineum: Secondary | ICD-10-CM

## 2013-07-29 DIAGNOSIS — G4733 Obstructive sleep apnea (adult) (pediatric): Secondary | ICD-10-CM

## 2013-07-29 DIAGNOSIS — I10 Essential (primary) hypertension: Secondary | ICD-10-CM

## 2013-07-29 DIAGNOSIS — H6691 Otitis media, unspecified, right ear: Secondary | ICD-10-CM | POA: Insufficient documentation

## 2013-07-29 MED ORDER — PHENTERMINE HCL 37.5 MG PO CAPS: 37.5000 mg | ORAL_CAPSULE | ORAL | Status: DC

## 2013-07-29 MED ORDER — AZITHROMYCIN 250 MG PO TABS: ORAL_TABLET | ORAL | Status: DC

## 2013-07-29 NOTE — Assessment & Plan Note (Signed)
Wt Readings from Last 3 Encounters:  07/29/13 298 lb (135.172 kg)  06/27/13 305 lb (138.347 kg)  06/12/13 308 lb (139.708 kg)   Body mass index is 49.59 kg/(m^2). Congratulated patient on recent 7 pound weight loss. Encouraged her to continue to limit caloric intake with goal of weight loss. Will start phentermine to help with appetite suppression. Will monitor blood pressure closely on this medication. Discussed potential side effects of the medicine. Followup in 4 weeks or sooner as needed.

## 2013-07-29 NOTE — Assessment & Plan Note (Signed)
Recently started on CPAP. Undergoing titration. Will follow.

## 2013-07-29 NOTE — Assessment & Plan Note (Signed)
Symptoms and exam are consistent with right otitis media. Will treat with azithromycin. Patient will call if symptoms are not improving.

## 2013-07-29 NOTE — Assessment & Plan Note (Signed)
Lesion is unchanged since previous visit. Will set up GYN evaluation for possible biopsy.

## 2013-07-29 NOTE — Progress Notes (Signed)
Subjective:    Patient ID: [name], female    DOB: 11-02-1971, 42 y.o.   MRN: [id number]  HPI 42 year old female with history of obesity, hypertension, obstructive sleep apnea presents for followup. She was scheduled to have ankle surgery last week but reports the surgery was canceled because her BMI was greater than 50 and she was too obese to be cared for at the outpatient surgical Center. She is now being scheduled in an inpatient setting. She has been trying to lose weight and has lost 7 pounds over the last month. She has been limiting intake of sugars. She has not been following any particular exercise program. She would like to consider bariatric surgery.  She is concerned today about right-sided ear pain, nasal congestion, and sinus pressure. This is been ongoing for about a week. She denies any fever or chills. She has not been taking any medication for pain.   Outpatient Encounter Prescriptions as of 07/29/2013  Medication Sig Dispense Refill  . hydrochlorothiazide (HYDRODIURIL) 25 MG tablet Take 1 tablet (25 mg total) by mouth daily.  90 tablet  4  . HYDROcodone-acetaminophen (NORCO/VICODIN) 5-325 MG per tablet Take 1-2 tablets by mouth every 6 (six) hours as needed for pain.  60 tablet  0  . meloxicam (MOBIC) 15 MG tablet Take 1 tablet (15 mg total) by mouth daily.  30 tablet  3  . cyclobenzaprine (FLEXERIL) 5 MG tablet Take 1-2 tablets (5-10 mg total) by mouth 3 (three) times daily as needed for muscle spasms.  90 tablet  1   No facility-administered encounter medications on file as of 07/29/2013.   BP 116/70  Pulse 92  Temp(Src) 98.3 F (36.8 C) (Oral)  Wt 298 lb (135.172 kg)  BMI 49.59 kg/m2  SpO2 98%  LMP 07/17/2013  Review of Systems  Constitutional: Negative for fever, chills, appetite change, fatigue and unexpected weight change.  HENT: Negative for ear pain, congestion, sore throat, trouble swallowing, neck pain, voice change and sinus pressure.   Eyes:  Negative for visual disturbance.  Respiratory: Negative for cough, shortness of breath, wheezing and stridor.   Cardiovascular: Negative for chest pain, palpitations and leg swelling.  Gastrointestinal: Negative for nausea, vomiting, abdominal pain, diarrhea, constipation, blood in stool, abdominal distention and anal bleeding.  Genitourinary: Negative for dysuria and flank pain.  Musculoskeletal: Negative for myalgias, arthralgias and gait problem.  Skin: Negative for color change and rash.  Neurological: Negative for dizziness and headaches.  Hematological: Negative for adenopathy. Does not bruise/bleed easily.  Psychiatric/Behavioral: Negative for suicidal ideas, sleep disturbance and dysphoric mood. The patient is not nervous/anxious.        Objective:   Physical Exam  Constitutional: She is oriented to person, place, and time. She appears well-developed and well-nourished. No distress.  HENT:  Head: Normocephalic and atraumatic.  Right Ear: External ear normal. Tympanic membrane is erythematous and bulging. A middle ear effusion is present.  Left Ear: External ear normal. Tympanic membrane is not erythematous and not bulging. A middle ear effusion is present.  Nose: Nose normal.  Mouth/Throat: Oropharynx is clear and moist. No oropharyngeal exudate.  Eyes: Conjunctivae are normal. Pupils are equal, round, and reactive to light. Right eye exhibits no discharge. Left eye exhibits no discharge. No scleral icterus.  Neck: Normal range of motion. Neck supple. No tracheal deviation present. No thyromegaly present.  Cardiovascular: Normal rate, regular rhythm, normal heart sounds and intact distal pulses.  Exam reveals no gallop and no friction rub.  No murmur heard. Pulmonary/Chest: Effort normal and breath sounds normal. No respiratory distress. She has no wheezes. She has no rales. She exhibits no tenderness.  Genitourinary:    There is lesion on the right labia. There is no  tenderness on the right labia. There is no tenderness or lesion on the left labia.  Musculoskeletal: Normal range of motion. She exhibits no edema and no tenderness.  Lymphadenopathy:    She has no cervical adenopathy.  Neurological: She is alert and oriented to person, place, and time. No cranial nerve deficit. She exhibits normal muscle tone. Coordination normal.  Skin: Skin is warm and dry. No rash noted. She is not diaphoretic. No erythema. No pallor.  Psychiatric: She has a normal mood and affect. Her behavior is normal. Judgment and thought content normal.          Assessment & Plan:

## 2013-07-29 NOTE — Assessment & Plan Note (Signed)
BP Readings from Last 3 Encounters:  07/29/13 116/70  06/27/13 102/70  06/12/13 130/70   Blood pressures have been well-controlled with hydrochlorothiazide. Will continue.

## 2013-08-29 ENCOUNTER — Encounter: Payer: Self-pay | Admitting: *Deleted

## 2013-08-30 ENCOUNTER — Ambulatory Visit (INDEPENDENT_AMBULATORY_CARE_PROVIDER_SITE_OTHER): Payer: Managed Care, Other (non HMO) | Admitting: Internal Medicine

## 2013-08-30 ENCOUNTER — Encounter: Payer: Self-pay | Admitting: Internal Medicine

## 2013-08-30 VITALS — BP 110/74 | HR 72 | Temp 98.3°F | Wt 299.0 lb

## 2013-08-30 DIAGNOSIS — N9089 Other specified noninflammatory disorders of vulva and perineum: Secondary | ICD-10-CM

## 2013-08-30 DIAGNOSIS — E669 Obesity, unspecified: Secondary | ICD-10-CM

## 2013-08-30 NOTE — Assessment & Plan Note (Addendum)
Wt Readings from Last 3 Encounters:  08/30/13 299 lb (135.626 kg)  07/29/13 298 lb (135.172 kg)  06/27/13 305 lb (138.347 kg)   Pt has not yet started Phentermine. She is planning to pick up Rx today. Encouraged her to do this to see if any improvement in appetite.  Recommended follow up with Bariatric Surgery at [facility] as scheduled. Discussed healthy diet, high in lean protein such as fish and low in processed carbohydrates and saturated fat. Discussed goal of <1800 calories daily.  Discussed regular physical activity with goal of walking daily.  Over of which >50% spent in face-to-face contact with patient discussing plan of care

## 2013-08-30 NOTE — Assessment & Plan Note (Signed)
Pt reports that recent biopsy of labial lesion was benign. Will request reports on this.

## 2013-08-30 NOTE — Progress Notes (Signed)
Subjective:    Patient ID: [name], female    DOB: [birth year], 42 y.o.   MRN: [id number]  HPI 42 year old female with history of obesity, hypertension, arthralgia presents for followup. At her last visit, we had started phentermine to help with appetite suppression. She reports she has not yet filled this medication. She is not following any particular diet. She is not exercising. She did go to a meeting at [facility] and is starting the process to look into bariatric surgery.  In the interim since her last visit, she also had biopsy of labial lesion. She reports that biopsy showed benign tissue.  Outpatient Encounter Prescriptions as of 08/30/2013  Medication Sig Dispense Refill  . hydrochlorothiazide (HYDRODIURIL) 25 MG tablet Take 1 tablet (25 mg total) by mouth daily.  90 tablet  4  . meloxicam (MOBIC) 15 MG tablet Take 1 tablet (15 mg total) by mouth daily.  30 tablet  3  . cyclobenzaprine (FLEXERIL) 5 MG tablet Take 1-2 tablets (5-10 mg total) by mouth 3 (three) times daily as needed for muscle spasms.  90 tablet  1  . HYDROcodone-acetaminophen (NORCO/VICODIN) 5-325 MG per tablet Take 1-2 tablets by mouth every 6 (six) hours as needed for pain.  60 tablet  0  . phentermine 37.5 MG capsule Take 1 capsule (37.5 mg total) by mouth every morning.  30 capsule  1   No facility-administered encounter medications on file as of 08/30/2013.   BP 110/74  Pulse 72  Temp(Src) 98.3 F (36.8 C) (Oral)  Wt 299 lb (135.626 kg)  BMI 49.76 kg/m2  SpO2 98%  Review of Systems  Constitutional: Negative for fever, chills, appetite change, fatigue and unexpected weight change.  HENT: Negative for ear pain, congestion, sore throat, trouble swallowing, neck pain, voice change and sinus pressure.   Eyes: Negative for visual disturbance.  Respiratory: Negative for cough, shortness of breath, wheezing and stridor.   Cardiovascular: Negative for chest pain, palpitations and leg  swelling.  Gastrointestinal: Negative for nausea, vomiting, abdominal pain, diarrhea, constipation, blood in stool, abdominal distention and anal bleeding.  Genitourinary: Negative for dysuria and flank pain.  Musculoskeletal: Negative for myalgias, arthralgias and gait problem.  Skin: Negative for color change and rash.  Neurological: Negative for dizziness and headaches.  Hematological: Negative for adenopathy. Does not bruise/bleed easily.  Psychiatric/Behavioral: Negative for suicidal ideas, sleep disturbance and dysphoric mood. The patient is not nervous/anxious.        Objective:   Physical Exam  Constitutional: She is oriented to person, place, and time. She appears well-developed and well-nourished. No distress.  HENT:  Head: Normocephalic and atraumatic.  Right Ear: External ear normal.  Left Ear: External ear normal.  Nose: Nose normal.  Mouth/Throat: Oropharynx is clear and moist. No oropharyngeal exudate.  Eyes: Conjunctivae are normal. Pupils are equal, round, and reactive to light. Right eye exhibits no discharge. Left eye exhibits no discharge. No scleral icterus.  Neck: Normal range of motion. Neck supple. No tracheal deviation present. No thyromegaly present.  Cardiovascular: Normal rate, regular rhythm, normal heart sounds and intact distal pulses.  Exam reveals no gallop and no friction rub.   No murmur heard. Pulmonary/Chest: Effort normal and breath sounds normal. No accessory muscle usage. Not tachypneic. No respiratory distress. She has no decreased breath sounds. She has no wheezes. She has no rhonchi. She has no rales. She exhibits no tenderness.  Musculoskeletal: Normal range of motion. She exhibits no edema and no tenderness.  Lymphadenopathy:    She has no cervical adenopathy.  Neurological: She is alert and oriented to person, place, and time. No cranial nerve deficit. She exhibits normal muscle tone. Coordination normal.  Skin: Skin is warm and dry. No rash  noted. She is not diaphoretic. No erythema. No pallor.  Psychiatric: She has a normal mood and affect. Her behavior is normal. Judgment and thought content normal.          Assessment & Plan:

## 2013-09-16 ENCOUNTER — Encounter: Payer: Self-pay | Admitting: Internal Medicine

## 2013-09-21 ENCOUNTER — Encounter: Payer: Self-pay | Admitting: Internal Medicine

## 2013-10-02 ENCOUNTER — Ambulatory Visit: Payer: Managed Care, Other (non HMO) | Admitting: Internal Medicine

## 2013-10-24 ENCOUNTER — Other Ambulatory Visit: Payer: Self-pay

## 2013-11-15 ENCOUNTER — Ambulatory Visit: Payer: Managed Care, Other (non HMO) | Admitting: Internal Medicine

## 2013-11-20 ENCOUNTER — Encounter: Payer: Self-pay | Admitting: General Surgery

## 2013-11-20 ENCOUNTER — Ambulatory Visit: Payer: Managed Care, Other (non HMO) | Admitting: Internal Medicine

## 2013-11-20 ENCOUNTER — Ambulatory Visit: Payer: Self-pay | Admitting: General Surgery

## 2013-11-20 IMAGING — CT CT ABDOMEN AND PELVIS WITHOUT AND WITH CONTRAST
2 of 4 series · 14 of 32 positions shown, 19 images · non-contrast
Comparison: none

REASON FOR EXAM: rt flank pain  hematuria
COMMENTS:

PROCEDURE:     CT  - CT ABDOMEN / PELVIS  W/WO  - June 19, 2013  [DATE]
RESULT:     History: Flank pain and hematuria.
Comparison Study: No recent.
TECHNIQUE: Standard triphasic CT obtained with 100 cc of [id number].
Evaluation in 3 dimensions on separate workstation performed.

[Series 2: without · axial · non-contrast · 0.92mm/px · z∈[-884,-485]mm · 8 of 172 slices shown, 13 images]
[im 20/172  soft-tissue]
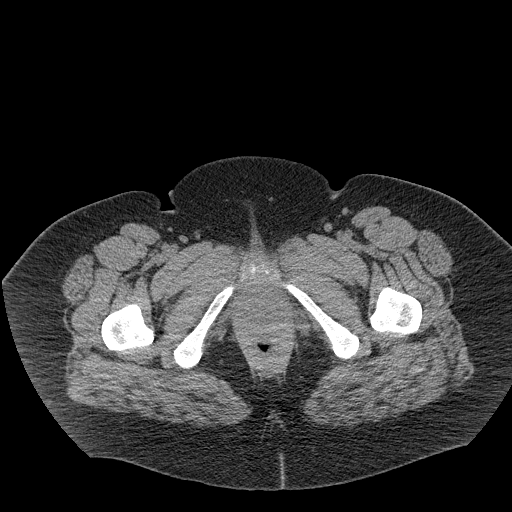
[im 20/172  bone]
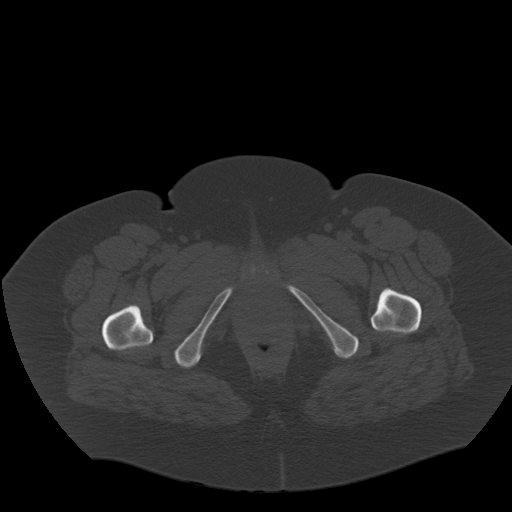
[im 39/172  soft-tissue]
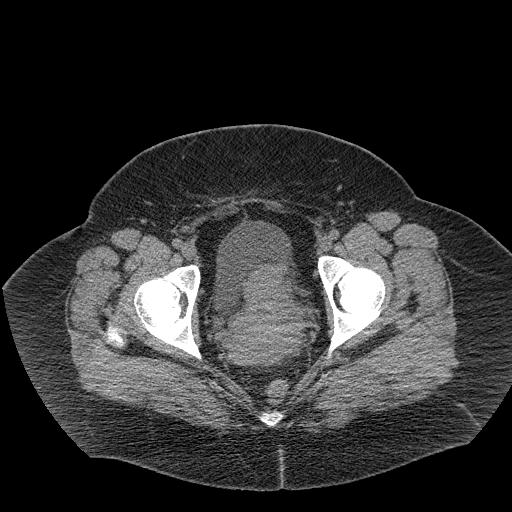
[im 58/172  soft-tissue]
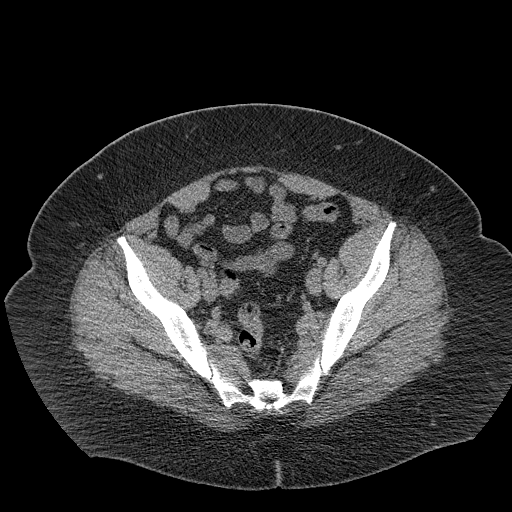
[im 77/172  soft-tissue]
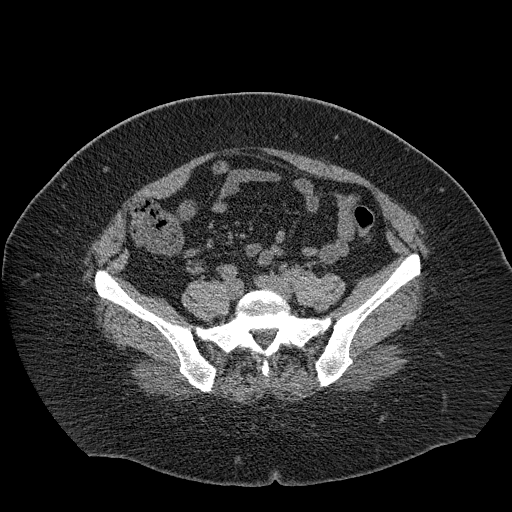
[im 96/172  soft-tissue]
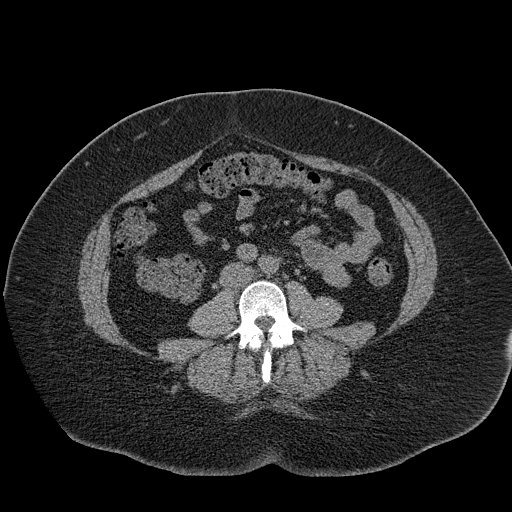
[im 96/172  lung]
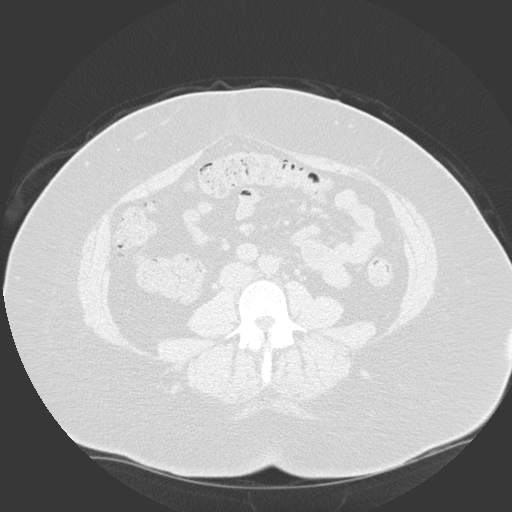
[im 115/172  soft-tissue]
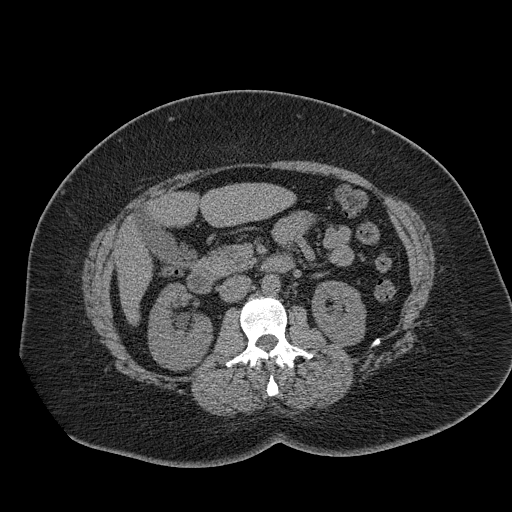
[im 115/172  lung]
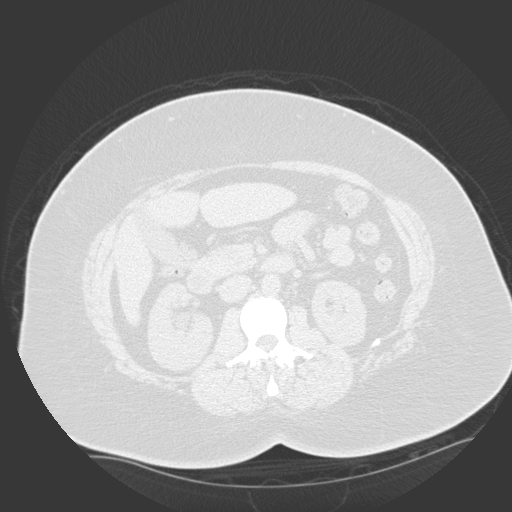
[im 134/172  soft-tissue]
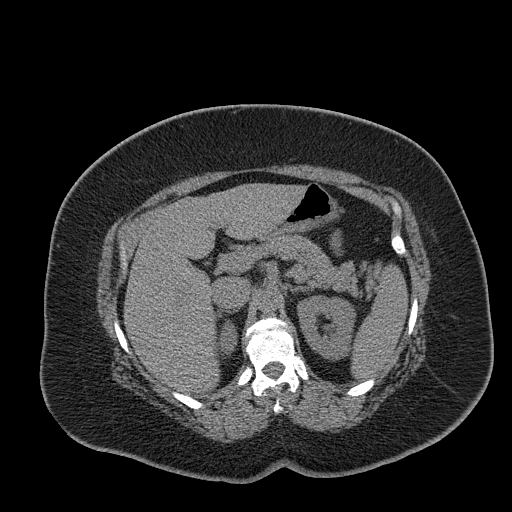
[im 134/172  lung]
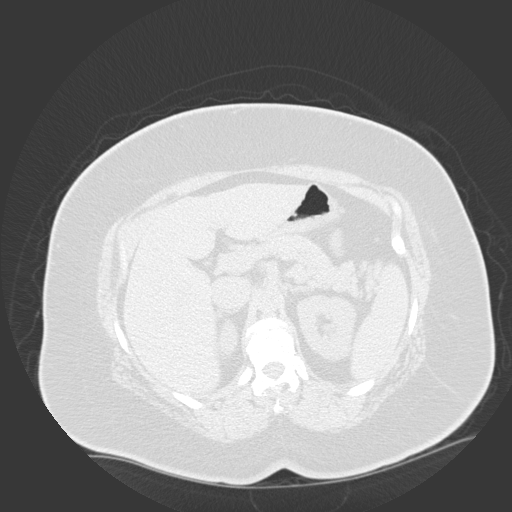
[im 153/172  soft-tissue]
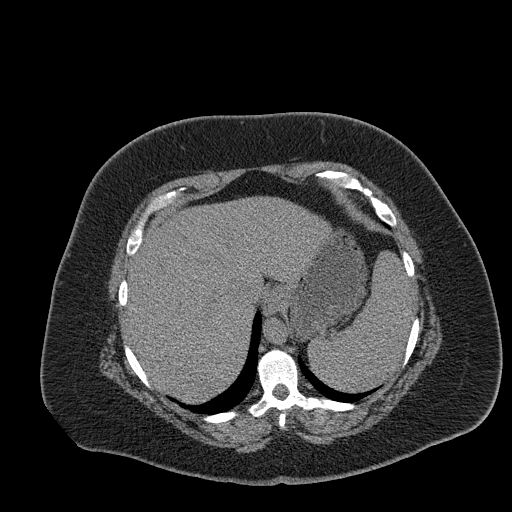
[im 153/172  lung]
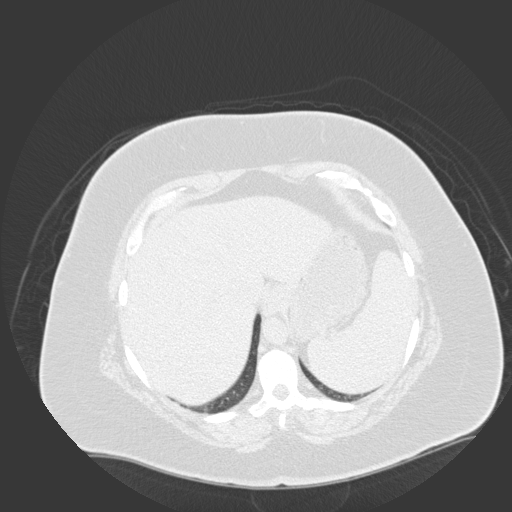

[Series 4: with · axial · 0.92mm/px · z∈[-884,-599]mm · 6 of 172 slices shown]
[im 20/172  soft-tissue]
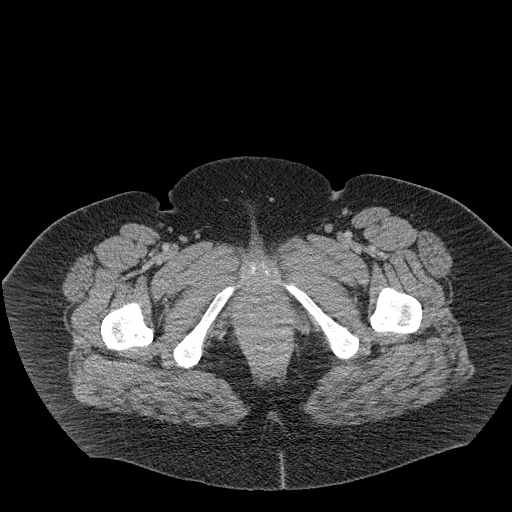
[im 39/172  soft-tissue]
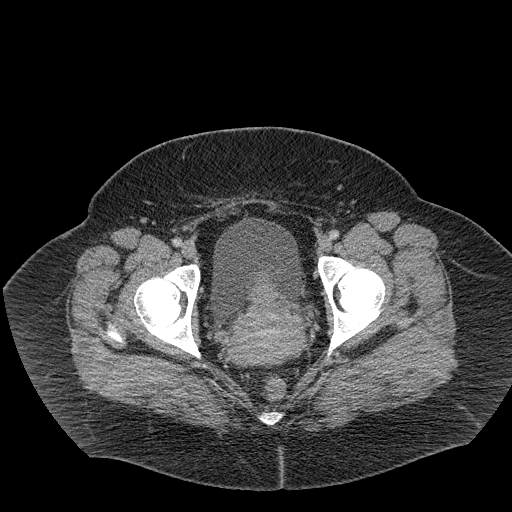
[im 58/172  soft-tissue]
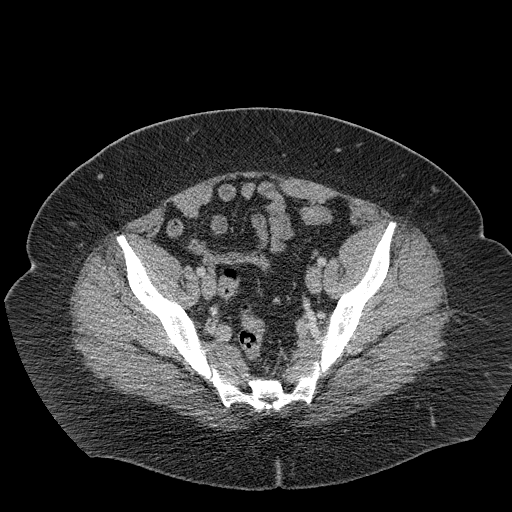
[im 77/172  soft-tissue]
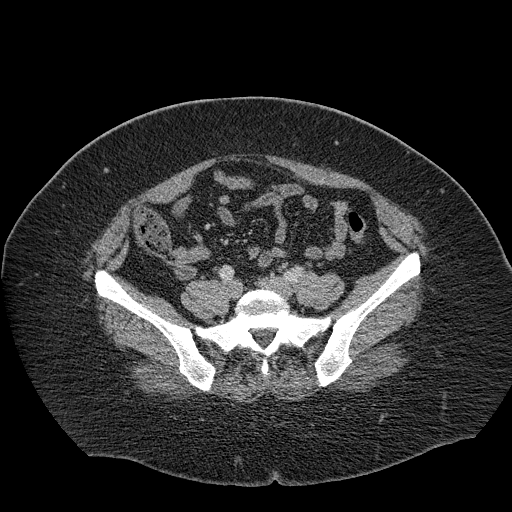
[im 96/172  soft-tissue]
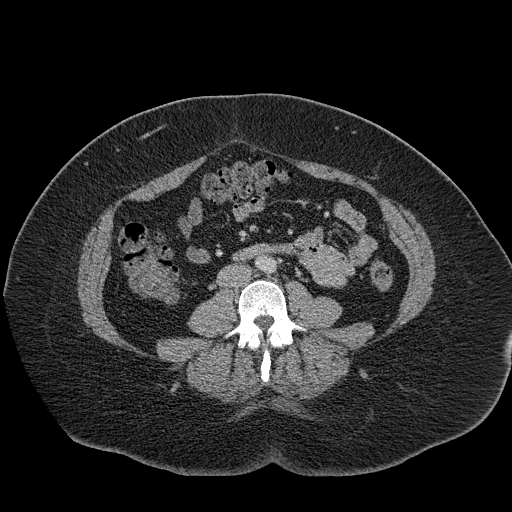
[im 115/172  soft-tissue]
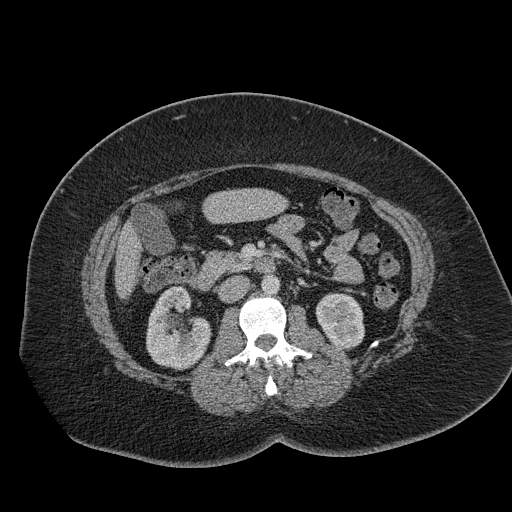

[14 of 32 positions shown; findings below may reference images not displayed]

FINDINGS: Adrenals normal. No evidence of focal renal abnormality. No
hydronephrosis. Bladder is unremarkable. Phleboliths are noted in the left
pelvis. A tiny nonobstructing distal left ureteral stone measuring 2 mm
cannot be excluded. Uterus and adnexa normal.

Liver normal. Hepatic veins and portal veins are patent. Spleen normal.
Gallbladder and biliary system normal. Pancreas normal. Subtle gastric and
gastroduodenal regions are normal. Abdominal aorta normal caliber.

No bowel distention. No inflammatory change in right or left lower quadrant.
Appendix is normal. Mild atelectasis of the lung bases. No free air.
IMPRESSION: Cannot excluded tiny 2 mm stone distal left ureter. This is
most likely phlebolith. This is nonobstructing. Exam otherwise unremarkable.

## 2013-11-25 ENCOUNTER — Ambulatory Visit: Payer: Managed Care, Other (non HMO) | Admitting: Internal Medicine

## 2013-11-25 ENCOUNTER — Ambulatory Visit: Payer: Managed Care, Other (non HMO) | Admitting: General Surgery

## 2013-12-05 ENCOUNTER — Ambulatory Visit: Payer: Managed Care, Other (non HMO) | Admitting: General Surgery

## 2013-12-06 ENCOUNTER — Ambulatory Visit (INDEPENDENT_AMBULATORY_CARE_PROVIDER_SITE_OTHER): Payer: BC Managed Care – PPO | Admitting: Internal Medicine

## 2013-12-06 ENCOUNTER — Encounter: Payer: Self-pay | Admitting: Internal Medicine

## 2013-12-06 VITALS — BP 118/72 | HR 103 | Temp 98.9°F | Wt 300.0 lb

## 2013-12-06 DIAGNOSIS — I10 Essential (primary) hypertension: Secondary | ICD-10-CM

## 2013-12-06 DIAGNOSIS — J111 Influenza due to unidentified influenza virus with other respiratory manifestations: Secondary | ICD-10-CM

## 2013-12-06 DIAGNOSIS — E669 Obesity, unspecified: Secondary | ICD-10-CM

## 2013-12-06 LAB — POCT INFLUENZA A/B
Influenza A, POC: POSITIVE
Influenza B, POC: NEGATIVE

## 2013-12-06 MED ORDER — HYDROCHLOROTHIAZIDE 25 MG PO TABS: 25.0000 mg | ORAL_TABLET | Freq: Every day | ORAL | Status: DC

## 2013-12-06 MED ORDER — OSELTAMIVIR PHOSPHATE 75 MG PO CAPS: 75.0000 mg | ORAL_CAPSULE | Freq: Two times a day (BID) | ORAL | Status: DC

## 2013-12-06 MED ORDER — ALBUTEROL SULFATE HFA 108 (90 BASE) MCG/ACT IN AERS: 2.0000 | INHALATION_SPRAY | Freq: Four times a day (QID) | RESPIRATORY_TRACT | Status: DC | PRN

## 2013-12-06 NOTE — Progress Notes (Signed)
Subjective:    Patient ID: [name], female    DOB: 24-Aug-1971, 42 y.o.   MRN: [id number]  HPI 42YO female with obesity, hypertension presents for follow up. She is scheduled for upper endoscopy on Monday in preparation for bariatric surgery, however she has not been feeling well over the last 2-3 days. Subjective fever, chills, shortness of breath, non-productive cough starting Thursday. Increased nasal congestion yesterday. Myalgias and chills persistent. Took son's albuterol nebulizer with some improvement in shortness of breath. Also taking some mucinex D with minimal improvement. Son sick with similar symptoms. She did not have a flu shot.   Outpatient Prescriptions Prior to Visit  Medication Sig Dispense Refill  . hydrochlorothiazide (HYDRODIURIL) 25 MG tablet Take 1 tablet (25 mg total) by mouth daily.  90 tablet  4  . HYDROcodone-acetaminophen (NORCO/VICODIN) 5-325 MG per tablet Take 1-2 tablets by mouth every 6 (six) hours as needed for pain.  60 tablet  0  . meloxicam (MOBIC) 15 MG tablet Take 1 tablet (15 mg total) by mouth daily.  30 tablet  3  . phentermine 37.5 MG capsule Take 1 capsule (37.5 mg total) by mouth every morning.  30 capsule  1  . cyclobenzaprine (FLEXERIL) 5 MG tablet Take 1-2 tablets (5-10 mg total) by mouth 3 (three) times daily as needed for muscle spasms.  90 tablet  1   No facility-administered medications prior to visit.   BP 118/72  Pulse 103  Temp(Src) 98.9 F (37.2 C) (Oral)  Wt 300 lb (136.079 kg)  SpO2 97%  LMP 12/06/2013   Review of Systems  Constitutional: Positive for fever, chills and fatigue. Negative for unexpected weight change.  HENT: Positive for congestion, postnasal drip, rhinorrhea and sore throat. Negative for ear discharge, ear pain, facial swelling, hearing loss, mouth sores, nosebleeds, sinus pressure, sneezing, tinnitus, trouble swallowing and voice change.   Eyes: Negative for pain, discharge, redness and visual  disturbance.  Respiratory: Positive for cough and shortness of breath. Negative for chest tightness, wheezing and stridor.   Cardiovascular: Negative for chest pain, palpitations and leg swelling.  Musculoskeletal: Positive for myalgias. Negative for arthralgias, neck pain and neck stiffness.  Skin: Negative for color change and rash.  Neurological: Negative for dizziness, weakness, light-headedness and headaches.  Hematological: Negative for adenopathy.       Objective:   Physical Exam  Constitutional: She is oriented to person, place, and time. She appears well-developed and well-nourished. No distress.  HENT:  Head: Normocephalic and atraumatic.  Right Ear: External ear normal.  Left Ear: External ear normal.  Nose: Mucosal edema and rhinorrhea present.  Mouth/Throat: Posterior oropharyngeal erythema present. No oropharyngeal exudate.  Eyes: Conjunctivae are normal. Pupils are equal, round, and reactive to light. Right eye exhibits no discharge. Left eye exhibits no discharge. No scleral icterus.  Neck: Normal range of motion. Neck supple. No tracheal deviation present. No thyromegaly present.  Cardiovascular: Normal rate, regular rhythm, normal heart sounds and intact distal pulses.  Exam reveals no gallop and no friction rub.   No murmur heard. Pulmonary/Chest: Effort normal and breath sounds normal. No accessory muscle usage. Not tachypneic. No respiratory distress. She has no decreased breath sounds. She has no wheezes. She has no rhonchi. She has no rales. She exhibits no tenderness.  Musculoskeletal: Normal range of motion. She exhibits no edema and no tenderness.  Lymphadenopathy:    She has no cervical adenopathy.  Neurological: She is alert and oriented to person, place, and time.  No cranial nerve deficit. She exhibits normal muscle tone. Coordination normal.  Skin: Skin is warm and dry. No rash noted. She is not diaphoretic. No erythema. No pallor.  Psychiatric: She has a  normal mood and affect. Her behavior is normal. Judgment and thought content normal.          Assessment & Plan:

## 2013-12-06 NOTE — Patient Instructions (Signed)

## 2013-12-06 NOTE — Progress Notes (Signed)
Pre-visit discussion using our clinic review tool. No additional management support is needed unless otherwise documented below in the visit note.  

## 2013-12-07 DIAGNOSIS — J111 Influenza due to unidentified influenza virus with other respiratory manifestations: Secondary | ICD-10-CM | POA: Insufficient documentation

## 2013-12-07 NOTE — Assessment & Plan Note (Signed)
BP Readings from Last 3 Encounters:  12/06/13 118/72  08/30/13 110/74  07/29/13 116/70   BP well controlled on HCTZ. Will continue.

## 2013-12-07 NOTE — Assessment & Plan Note (Signed)
Wt Readings from Last 3 Encounters:  12/06/13 300 lb (136.079 kg)  08/30/13 299 lb (135.626 kg)  07/29/13 298 lb (135.172 kg)   No improvement in weight over the last month. Encouraged compliance with diet and exercise when she is feeling better. We discussed that she will not be able to have upper endoscopy on Monday as scheduled because of current influenza. She will need to reschedule this.

## 2013-12-07 NOTE — Assessment & Plan Note (Signed)
Symptoms, exam, and rapid test positive for influenza. Encouraged rest, increase fluid intake. Will use Tylenol or ibuprofen as needed for fever or muscle pain. Will start Tamiflu 75 mg twice daily for 5 days. Followup if any recurrent symptoms, persistent shortness of breath, chest pain, or other concerns.

## 2013-12-09 ENCOUNTER — Ambulatory Visit (INDEPENDENT_AMBULATORY_CARE_PROVIDER_SITE_OTHER): Payer: BC Managed Care – PPO | Admitting: General Surgery

## 2013-12-09 ENCOUNTER — Encounter: Payer: Self-pay | Admitting: General Surgery

## 2013-12-09 VITALS — BP 132/78 | HR 78 | Resp 16 | Ht 65.5 in | Wt 305.0 lb

## 2013-12-09 DIAGNOSIS — N6019 Diffuse cystic mastopathy of unspecified breast: Secondary | ICD-10-CM

## 2013-12-09 DIAGNOSIS — N6011 Diffuse cystic mastopathy of right breast: Secondary | ICD-10-CM

## 2013-12-09 DIAGNOSIS — D242 Benign neoplasm of left breast: Secondary | ICD-10-CM

## 2013-12-09 DIAGNOSIS — D249 Benign neoplasm of unspecified breast: Secondary | ICD-10-CM

## 2013-12-09 NOTE — Progress Notes (Signed)
Patient ID: [name], female   DOB: 28-Feb-1971, 42 y.o.   MRN: [id number]  Chief Complaint  Patient presents with  . Follow-up    5 month right diagnostic mammogram    HPI [name] is a 42 y.o. female  who presents for a breast evaluation. The most recent mammogram was done on 11/20/13. Patient does perform regular self breast checks and gets regular mammograms done.  The patient denies any problems with the breasts at this time. Patient had stereo biopsy in right breast and core biopsy on the left breast 6 months ago. Pathology - Fibrocystic disease in right breast, fibroadenoma in the left breast.      HPI  Past Medical History  Diagnosis Date  . Hypertension   . Thyroid disease   . Blood in urine 2014  . Breast mass 2013    Past Surgical History  Procedure Laterality Date  . Cesarean section  2007  . Dilation and curettage of uterus    . Breast biopsy Right 2013    benign - fatty tissue    Family History  Problem Relation Age of Onset  . Arthritis Mother   . Diabetes Father   . Hypertension Father   . Polycystic ovary syndrome Sister   . Cancer Maternal Grandmother     breast    Social History History  Substance Use Topics  . Smoking status: Former Games developer  . Smokeless tobacco: Never Used     Comment: quit 2006  . Alcohol Use: Yes     Comment: rare    Allergies  Allergen Reactions  . Avelox [Moxifloxacin Hcl In Nacl] Palpitations    Current Outpatient Prescriptions  Medication Sig Dispense Refill  . albuterol (PROVENTIL HFA;VENTOLIN HFA) 108 (90 BASE) MCG/ACT inhaler Inhale 2 puffs into the lungs every 6 (six) hours as needed for wheezing or shortness of breath.  1 Inhaler  0  . hydrochlorothiazide (HYDRODIURIL) 25 MG tablet Take 1 tablet (25 mg total) by mouth daily.  90 tablet  4  . HYDROcodone-acetaminophen (NORCO/VICODIN) 5-325 MG per tablet Take 1-2 tablets by mouth every 6 (six) hours as needed for pain.  60 tablet  0  . meloxicam  (MOBIC) 15 MG tablet Take 1 tablet (15 mg total) by mouth daily.  30 tablet  3  . oseltamivir (TAMIFLU) 75 MG capsule Take 1 capsule (75 mg total) by mouth 2 (two) times daily.  14 capsule  0  . phentermine 37.5 MG capsule Take 1 capsule (37.5 mg total) by mouth every morning.  30 capsule  1   No current facility-administered medications for this visit.    Review of Systems Review of Systems  Constitutional: Negative.   Respiratory: Negative.   Cardiovascular: Negative.     Blood pressure 132/78, pulse 78, resp. rate 16, height 5' 5.5" (1.664 m), weight 305 lb (138.347 kg), last menstrual period 12/06/2013.  Physical Exam Physical Exam  Constitutional: She is oriented to person, place, and time. She appears well-developed and well-nourished.  Eyes: Conjunctivae are normal. No scleral icterus.  Neck: No thyromegaly present.  Pulmonary/Chest: Right breast exhibits no inverted nipple, no mass, no nipple discharge, no skin change and no tenderness. Left breast exhibits no inverted nipple, no mass, no nipple discharge, no skin change and no tenderness.  Lymphadenopathy:    She has no cervical adenopathy.    She has no axillary adenopathy.  Neurological: She is alert and oriented to person, place, and time.  Skin: Skin is  warm and dry.    Data Reviewed Prior records, mammogram.   Assessment    Stable exam.     Plan    6 mo f/u with bilateral screening mammogram        Marcquis Ridlon G 12/10/2013, 12:51 PM

## 2013-12-09 NOTE — Patient Instructions (Signed)
Patient to return in 6 months with a bilateral screening mammogram.  

## 2013-12-10 ENCOUNTER — Encounter: Payer: Self-pay | Admitting: General Surgery

## 2014-01-10 ENCOUNTER — Ambulatory Visit (INDEPENDENT_AMBULATORY_CARE_PROVIDER_SITE_OTHER): Payer: BC Managed Care – PPO | Admitting: Internal Medicine

## 2014-01-10 ENCOUNTER — Encounter: Payer: Self-pay | Admitting: Internal Medicine

## 2014-01-10 VITALS — BP 106/78 | HR 82 | Temp 98.3°F | Wt 300.0 lb

## 2014-01-10 DIAGNOSIS — I10 Essential (primary) hypertension: Secondary | ICD-10-CM

## 2014-01-10 DIAGNOSIS — F4323 Adjustment disorder with mixed anxiety and depressed mood: Secondary | ICD-10-CM

## 2014-01-10 DIAGNOSIS — E669 Obesity, unspecified: Secondary | ICD-10-CM

## 2014-01-10 NOTE — Progress Notes (Signed)
Pre-visit discussion using our clinic review tool. No additional management support is needed unless otherwise documented below in the visit note.  

## 2014-01-11 NOTE — Progress Notes (Signed)
Subjective:    Patient ID: [name], female    DOB: [birth year], 43 y.o.   MRN: [id number]  HPI 43 year old female with history of hypertension and obesity presents for followup. At her last visit, she was diagnosed with influenza and treated with Tamiflu. She reports that symptoms have completely resolved. She is feeling well. She is scheduled to followup with her bariatric surgeon and have upper endoscopy in preparation for upcoming bariatric surgery. She is trying to follow a healthy diet and increase physical activity as tolerated. Physical activity has been limited by pain in her ankle which is chronic. No new concerns today.  Outpatient Encounter Prescriptions as of 01/10/2014  Medication Sig  . hydrochlorothiazide (HYDRODIURIL) 25 MG tablet Take 1 tablet (25 mg total) by mouth daily.  Marland Kitchen albuterol (PROVENTIL HFA;VENTOLIN HFA) 108 (90 BASE) MCG/ACT inhaler Inhale 2 puffs into the lungs every 6 (six) hours as needed for wheezing or shortness of breath.  . meloxicam (MOBIC) 15 MG tablet Take 1 tablet (15 mg total) by mouth daily.   BP 106/78  Pulse 82  Temp(Src) 98.3 F (36.8 C) (Oral)  Wt 300 lb (136.079 kg)  SpO2 96%  Review of Systems  Constitutional: Negative for fever, chills, appetite change, fatigue and unexpected weight change.  HENT: Negative for congestion, ear pain, sinus pressure, sore throat, trouble swallowing and voice change.   Eyes: Negative for visual disturbance.  Respiratory: Negative for cough, shortness of breath, wheezing and stridor.   Cardiovascular: Negative for chest pain, palpitations and leg swelling.  Gastrointestinal: Negative for nausea, vomiting, abdominal pain, diarrhea, constipation, blood in stool, abdominal distention and anal bleeding.  Genitourinary: Negative for dysuria and flank pain.  Musculoskeletal: Positive for arthralgias (ankle). Negative for gait problem and neck pain.  Skin: Negative for color change and rash.  Neurological:  Negative for dizziness and headaches.  Hematological: Negative for adenopathy. Does not bruise/bleed easily.  Psychiatric/Behavioral: Negative for suicidal ideas, sleep disturbance and dysphoric mood. The patient is not nervous/anxious.        Objective:   Physical Exam  Constitutional: She is oriented to person, place, and time. She appears well-developed and well-nourished. No distress.  HENT:  Head: Normocephalic and atraumatic.  Right Ear: External ear normal.  Left Ear: External ear normal.  Nose: Nose normal.  Mouth/Throat: Oropharynx is clear and moist. No oropharyngeal exudate.  Eyes: Conjunctivae are normal. Pupils are equal, round, and reactive to light. Right eye exhibits no discharge. Left eye exhibits no discharge. No scleral icterus.  Neck: Normal range of motion. Neck supple. No tracheal deviation present. No thyromegaly present.  Cardiovascular: Normal rate, regular rhythm, normal heart sounds and intact distal pulses.  Exam reveals no gallop and no friction rub.   No murmur heard. Pulmonary/Chest: Effort normal and breath sounds normal. No accessory muscle usage. Not tachypneic. No respiratory distress. She has no decreased breath sounds. She has no wheezes. She has no rhonchi. She has no rales. She exhibits no tenderness.  Musculoskeletal: Normal range of motion. She exhibits no edema and no tenderness.  Lymphadenopathy:    She has no cervical adenopathy.  Neurological: She is alert and oriented to person, place, and time. No cranial nerve deficit. She exhibits normal muscle tone. Coordination normal.  Skin: Skin is warm and dry. No rash noted. She is not diaphoretic. No erythema. No pallor.  Psychiatric: She has a normal mood and affect. Her behavior is normal. Judgment and thought content normal.  Assessment & Plan:

## 2014-01-11 NOTE — Assessment & Plan Note (Signed)
BP Readings from Last 3 Encounters:  01/10/14 106/78  12/09/13 132/78  12/06/13 118/72   Blood pressure well-controlled on hydrochlorothiazide alone. Will continue.

## 2014-01-11 NOTE — Assessment & Plan Note (Addendum)
Wt Readings from Last 3 Encounters:  01/10/14 300 lb (136.079 kg)  12/09/13 305 lb (138.347 kg)  12/06/13 300 lb (136.079 kg)   Body mass index is 49.15 kg/(m^2).  Encouraged healthy diet with caloric restriction and goal of weight loss. She is scheduled to follow up with bariatric surgeon in the next few weeks.

## 2014-01-22 ENCOUNTER — Telehealth: Payer: Self-pay | Admitting: Internal Medicine

## 2014-01-22 NOTE — Telephone Encounter (Signed)
Relevant patient education assigned to patient using Emmi. ° °

## 2014-03-04 HISTORY — PX: ROUX-EN-Y PROCEDURE: SUR1287

## 2014-05-11 LAB — HM PAP SMEAR: HM Pap smear: NEGATIVE

## 2014-05-13 ENCOUNTER — Encounter: Payer: Self-pay | Admitting: Internal Medicine

## 2014-05-13 ENCOUNTER — Other Ambulatory Visit (HOSPITAL_COMMUNITY)
Admission: RE | Admit: 2014-05-13 | Discharge: 2014-05-13 | Disposition: A | Discharge status: Home / Self Care | Payer: BC Managed Care – PPO | Source: Ambulatory Visit | Attending: Internal Medicine | Admitting: Internal Medicine

## 2014-05-13 ENCOUNTER — Ambulatory Visit (INDEPENDENT_AMBULATORY_CARE_PROVIDER_SITE_OTHER): Payer: BC Managed Care – PPO | Admitting: Internal Medicine

## 2014-05-13 VITALS — BP 110/62 | HR 81 | Temp 98.4°F | Ht 64.5 in | Wt 255.5 lb

## 2014-05-13 DIAGNOSIS — E669 Obesity, unspecified: Secondary | ICD-10-CM

## 2014-05-13 DIAGNOSIS — Z01419 Encounter for gynecological examination (general) (routine) without abnormal findings: Secondary | ICD-10-CM | POA: Insufficient documentation

## 2014-05-13 DIAGNOSIS — N760 Acute vaginitis: Secondary | ICD-10-CM | POA: Insufficient documentation

## 2014-05-13 DIAGNOSIS — I10 Essential (primary) hypertension: Secondary | ICD-10-CM

## 2014-05-13 DIAGNOSIS — Z0000 Encounter for general adult medical examination without abnormal findings: Secondary | ICD-10-CM

## 2014-05-13 DIAGNOSIS — Z1151 Encounter for screening for human papillomavirus (HPV): Secondary | ICD-10-CM | POA: Insufficient documentation

## 2014-05-13 DIAGNOSIS — Z113 Encounter for screening for infections with a predominantly sexual mode of transmission: Secondary | ICD-10-CM | POA: Insufficient documentation

## 2014-05-13 LAB — COMPREHENSIVE METABOLIC PANEL
ALT: 21 U/L (ref 0–35)
AST: 24 U/L (ref 0–37)
Albumin: 3.6 g/dL (ref 3.5–5.2)
Alkaline Phosphatase: 78 U/L (ref 39–117)
BUN: 16 mg/dL (ref 6–23)
CO2: 28 mEq/L (ref 19–32)
Calcium: 9.1 mg/dL (ref 8.4–10.5)
Chloride: 105 mEq/L (ref 96–112)
Creatinine, Ser: 0.7 mg/dL (ref 0.4–1.2)
GFR: 93.94 mL/min (ref >60.00)
Glucose, Bld: 88 mg/dL (ref 70–99)
Potassium: 3.7 mEq/L (ref 3.5–5.1)
Sodium: 140 mEq/L (ref 135–145)
Total Bilirubin: 0.7 mg/dL (ref 0.2–1.2)
Total Protein: 6.5 g/dL (ref 6.0–8.3)

## 2014-05-13 LAB — LIPID PANEL
Cholesterol: 130 mg/dL (ref 0–200)
HDL: 34 mg/dL — ABNORMAL LOW (ref >39.00)
LDL Cholesterol: 72 mg/dL (ref 0–99)
Total CHOL/HDL Ratio: 4
Triglycerides: 122 mg/dL (ref 0.0–149.0)
VLDL: 24.4 mg/dL (ref 0.0–40.0)

## 2014-05-13 NOTE — Assessment & Plan Note (Signed)
Wt Readings from Last 3 Encounters:  05/13/14 255 lb 8 oz (115.894 kg)  01/10/14 300 lb (136.079 kg)  12/09/13 305 lb (138.347 kg)   Congratulated pt on 45lb weight loss. Encouraged continued healthy diet and exercise with goal of additional weight loss.

## 2014-05-13 NOTE — Progress Notes (Signed)
Subjective:    Patient ID: [name], female    DOB: 13-Feb-1971, 43 y.o.   MRN: [id number]  HPI 43YO female presents for annual exam. Doing well after gastric bypass in 02/2014. Doing well. Rare nausea.No pain after surgery. No diarrhea. Has lost 45lbs. Tolerating soft food diet. Follow up scheduled July 6th. Difficult time for her emotionally as she recently learned her husband was cheating on her. In counseling. Family continues to work on issues.   Review of Systems  Constitutional: Negative for fever, chills, appetite change, fatigue and unexpected weight change.  HENT: Negative for congestion, ear pain, sinus pressure, sore throat, trouble swallowing and voice change.   Eyes: Negative for visual disturbance.  Respiratory: Negative for cough, shortness of breath, wheezing and stridor.   Cardiovascular: Negative for chest pain, palpitations and leg swelling.  Gastrointestinal: Negative for nausea, vomiting, abdominal pain, diarrhea, constipation, blood in stool, abdominal distention and anal bleeding.  Genitourinary: Positive for vaginal discharge. Negative for dysuria, flank pain, vaginal bleeding and vaginal pain.  Musculoskeletal: Positive for arthralgias. Negative for gait problem, myalgias and neck pain.  Skin: Negative for color change and rash.  Neurological: Negative for dizziness and headaches.  Hematological: Negative for adenopathy. Does not bruise/bleed easily.  Psychiatric/Behavioral: Positive for dysphoric mood. Negative for suicidal ideas and sleep disturbance. The patient is not nervous/anxious.        Objective:    BP 110/62  Pulse 81  Temp(Src) 98.4 F (36.9 C) (Oral)  Ht 5' 4.5" (1.638 m)  Wt 255 lb 8 oz (115.894 kg)  BMI 43.19 kg/m2  SpO2 96%  LMP 04/30/2014 Physical Exam  Constitutional: She is oriented to person, place, and time. She appears well-developed and well-nourished. No distress.  HENT:  Head: Normocephalic and atraumatic.  Right Ear:  External ear normal.  Left Ear: External ear normal.  Nose: Nose normal.  Mouth/Throat: Oropharynx is clear and moist. No oropharyngeal exudate.  Eyes: Conjunctivae are normal. Pupils are equal, round, and reactive to light. Right eye exhibits no discharge. Left eye exhibits no discharge. No scleral icterus.  Neck: Normal range of motion. Neck supple. No tracheal deviation present. No thyromegaly present.  Cardiovascular: Normal rate, regular rhythm, normal heart sounds and intact distal pulses.  Exam reveals no gallop and no friction rub.   No murmur heard. Pulmonary/Chest: Effort normal and breath sounds normal. No accessory muscle usage. Not tachypneic. No respiratory distress. She has no decreased breath sounds. She has no wheezes. She has no rhonchi. She has no rales. She exhibits no tenderness.  Abdominal: Soft. Bowel sounds are normal. She exhibits no distension and no mass. There is no tenderness. There is no rebound and no guarding.  Genitourinary: Rectum normal, vagina normal and uterus normal. No breast swelling, tenderness, discharge or bleeding. Pelvic exam was performed with patient supine. There is no rash, tenderness or lesion on the right labia. There is no rash, tenderness or lesion on the left labia. Uterus is not enlarged and not tender. Cervix exhibits no motion tenderness, no discharge and no friability. Right adnexum displays no mass, no tenderness and no fullness. Left adnexum displays no mass, no tenderness and no fullness. No erythema or tenderness around the vagina. No vaginal discharge found.  Musculoskeletal: Normal range of motion. She exhibits no edema and no tenderness.  Lymphadenopathy:    She has no cervical adenopathy.  Neurological: She is alert and oriented to person, place, and time. No cranial nerve deficit. She exhibits normal muscle  tone. Coordination normal.  Skin: Skin is warm and dry. No rash noted. She is not diaphoretic. No erythema. No pallor.    Psychiatric: She has a normal mood and affect. Her behavior is normal. Judgment and thought content normal.          Assessment & Plan:   Problem List Items Addressed This Visit     Unprioritized   Hypertension      BP Readings from Last 3 Encounters:  05/13/14 110/62  01/10/14 106/78  12/09/13 132/78   BP lower after 45lb weight loss. Will try stopping HCTZ. Check renal function with labs. Pt to check BP at home and call if any readings over 140/90    Obesity      Wt Readings from Last 3 Encounters:  05/13/14 255 lb 8 oz (115.894 kg)  01/10/14 300 lb (136.079 kg)  12/09/13 305 lb (138.347 kg)   Congratulated pt on 45lb weight loss. Encouraged continued healthy diet and exercise with goal of additional weight loss.    Routine general medical examination at a health care facility - Primary     General medical exam normal today including breast and pelvic exam. PAP and STD testing sent today given recent exposure. Encouraged healthy diet and exercise. Immunizations are UTD except for Flu vaccine last fall which was declined.     Relevant Orders      HIV Antibody ( Reflex)      RPR      Comprehensive metabolic panel      Lipid panel      Hepatitis B surface antibody      Cytology - PAP       Return in about 6 months (around 11/13/2014) for Recheck of Blood Pressure.

## 2014-05-13 NOTE — Progress Notes (Signed)
Pre visit review using our clinic review tool, if applicable. No additional management support is needed unless otherwise documented below in the visit note. 

## 2014-05-13 NOTE — Patient Instructions (Signed)
Stop HCTZ.  Monitor blood pressure 1-2 times weekly. Call if >140/90.

## 2014-05-13 NOTE — Assessment & Plan Note (Signed)
General medical exam normal today including breast and pelvic exam. PAP and STD testing sent today given recent exposure. Encouraged healthy diet and exercise. Immunizations are UTD except for Flu vaccine last fall which was declined.

## 2014-05-13 NOTE — Assessment & Plan Note (Signed)
BP Readings from Last 3 Encounters:  05/13/14 110/62  01/10/14 106/78  12/09/13 132/78   BP lower after 45lb weight loss. Will try stopping HCTZ. Check renal function with labs. Pt to check BP at home and call if any readings over 140/90

## 2014-05-14 LAB — HIV ANTIBODY (ROUTINE TESTING W REFLEX): HIV 1&2 Ab, 4th Generation: NONREACTIVE

## 2014-05-14 LAB — HEPATITIS B SURFACE ANTIBODY,QUALITATIVE: Hep B S Ab: POSITIVE — AB

## 2014-05-14 LAB — RPR

## 2014-05-19 ENCOUNTER — Other Ambulatory Visit: Payer: Self-pay | Admitting: *Deleted

## 2014-05-19 MED ORDER — METRONIDAZOLE 500 MG PO TABS: 500.0000 mg | ORAL_TABLET | Freq: Two times a day (BID) | ORAL | Status: DC

## 2014-06-04 ENCOUNTER — Other Ambulatory Visit: Payer: Self-pay | Admitting: Internal Medicine

## 2014-06-04 NOTE — Telephone Encounter (Signed)
Electronic refill request for Hydrochlorothiazide, however not on med list.  Please advise

## 2014-06-05 ENCOUNTER — Encounter: Payer: Self-pay | Admitting: General Surgery

## 2014-06-06 ENCOUNTER — Telehealth: Payer: Self-pay | Admitting: *Deleted

## 2014-06-06 MED ORDER — DOCUSATE SODIUM 100 MG PO CAPS: 100.0000 mg | ORAL_CAPSULE | Freq: Every day | ORAL | Status: DC

## 2014-06-06 NOTE — Telephone Encounter (Signed)
Fine to refill Docusate Sodium 100mg  po daily #30 refill 6.

## 2014-06-06 NOTE — Telephone Encounter (Signed)
Refill fax from [facility] requesting Docusate Sodium 100mg  Soft gel.  Last OV 5.26.15, however medication not on med list.  Please advise

## 2014-06-12 ENCOUNTER — Ambulatory Visit (INDEPENDENT_AMBULATORY_CARE_PROVIDER_SITE_OTHER): Payer: BC Managed Care – PPO | Admitting: General Surgery

## 2014-06-12 ENCOUNTER — Other Ambulatory Visit: Payer: BC Managed Care – PPO

## 2014-06-12 ENCOUNTER — Encounter: Payer: Self-pay | Admitting: General Surgery

## 2014-06-12 VITALS — BP 130/80 | HR 76 | Resp 14 | Ht 65.0 in | Wt 242.0 lb

## 2014-06-12 DIAGNOSIS — N6019 Diffuse cystic mastopathy of unspecified breast: Secondary | ICD-10-CM

## 2014-06-12 DIAGNOSIS — N6011 Diffuse cystic mastopathy of right breast: Secondary | ICD-10-CM

## 2014-06-12 NOTE — Progress Notes (Signed)
Patient ID: [name], female   DOB: 1971-08-09, 43 y.o.   MRN: [id number]  Chief Complaint  Patient presents with  . Follow-up    mammogram    HPI [name] is a 43 y.o. female who presents for a breast evaluation. The most recent mammogram was done on 06/04/14.Patient does perform regular self breast checks and gets regular mammograms done.  No new breast complaints.  HPI  Past Medical History  Diagnosis Date  . Hypertension   . Thyroid disease   . Blood in urine 2014  . Breast mass 2013    Past Surgical History  Procedure Laterality Date  . Cesarean section  2007  . Dilation and curettage of uterus    . Roux-en-y procedure  03/04/2014    Dr. Saint Lucia  . Breast biopsy Right 2013    benign - fatty tissue  . Breast biopsy Left 06-12-13    fibroadenoma /core biopsy  . Breast biopsy Right 06-17-13    fibrocystic /stereo biopsy    Family History  Problem Relation Age of Onset  . Arthritis Mother   . Diabetes Father   . Hypertension Father   . Polycystic ovary syndrome Sister   . Cancer Maternal Grandmother     breast    Social History History  Substance Use Topics  . Smoking status: Former Research scientist (life sciences)  . Smokeless tobacco: Never Used     Comment: quit 2006  . Alcohol Use: Yes     Comment: rare    Allergies  Allergen Reactions  . Avelox [Moxifloxacin Hcl In Nacl] Palpitations    Current Outpatient Prescriptions  Medication Sig Dispense Refill  . docusate sodium (COLACE) 100 MG capsule Take 1 capsule (100 mg total) by mouth daily.  30 capsule  6  . hydrochlorothiazide (HYDRODIURIL) 25 MG tablet TAKE 1 TABLET (25 MG TOTAL) BY MOUTH DAILY.  90 tablet  3  . pantoprazole (PROTONIX) 40 MG tablet Take 40 mg by mouth daily.        No current facility-administered medications for this visit.    Review of Systems Review of Systems  Constitutional: Negative.   Respiratory: Negative.   Cardiovascular: Negative.     Blood pressure 130/80, pulse 76, resp. rate  14, height 5\' 5"  (1.651 m), weight 242 lb (109.77 kg), last menstrual period 05/31/2014.  Physical Exam Physical Exam  Constitutional: She is oriented to person, place, and time. She appears well-developed and well-nourished.  Eyes: Conjunctivae are normal. No scleral icterus.  Neck: Neck supple.  Cardiovascular: Normal rate, regular rhythm and normal heart sounds.   Pulmonary/Chest: Effort normal and breath sounds normal. Right breast exhibits no inverted nipple, no mass, no nipple discharge, no skin change and no tenderness. Left breast exhibits mass. Left breast exhibits no inverted nipple, no nipple discharge, no skin change and no tenderness.    Abdominal: Soft. There is no tenderness.  Lymphadenopathy:    She has no cervical adenopathy.    She has no axillary adenopathy.  Neurological: She is alert and oriented to person, place, and time.  Skin: Skin is warm and dry.    Data Reviewed Mammogram reviewed and stable. Targeted US of left breast over the thickening near axillary portion showed no findings.  Assessment      New findings in left breast-possible fibrosis. Needs f/u in 2-3 mos.    Plan    Follow up in office in 2-3 months.       SANKAR,SEEPLAPUTHUR G 06/13/2014, 5:41 AM

## 2014-06-12 NOTE — Patient Instructions (Addendum)
Continue self breast exams. Call office for any new breast issues or concerns. Patient to return in 2-3 months.

## 2014-06-13 ENCOUNTER — Encounter: Payer: Self-pay | Admitting: General Surgery

## 2014-09-09 ENCOUNTER — Encounter: Payer: Self-pay | Admitting: General Surgery

## 2014-09-09 ENCOUNTER — Ambulatory Visit (INDEPENDENT_AMBULATORY_CARE_PROVIDER_SITE_OTHER): Payer: BC Managed Care – PPO | Admitting: General Surgery

## 2014-09-09 VITALS — BP 126/78 | HR 86 | Resp 12 | Ht 65.0 in | Wt 227.0 lb

## 2014-09-09 DIAGNOSIS — N6019 Diffuse cystic mastopathy of unspecified breast: Secondary | ICD-10-CM

## 2014-09-09 DIAGNOSIS — N6012 Diffuse cystic mastopathy of left breast: Secondary | ICD-10-CM

## 2014-09-09 NOTE — Progress Notes (Signed)
Patient ID: CENDY OCONNOR, female   DOB: 04-Feb-1971, 43 y.o.   MRN: [id number]  Chief Complaint  Patient presents with  . Follow-up    breast check    HPI ASUSENA SIGLEY is a 43 y.o. female. here today for a breast check. Her last visit was in June of this year and noted to have a focal thickening in upper outer quadrant. [place] and mammogram were normal then. Patient states she feel no lumps in her breast. HPI  Past Medical History  Diagnosis Date  . Hypertension   . Thyroid disease   . Blood in urine 2014  . Breast mass 2013    Past Surgical History  Procedure Laterality Date  . Cesarean section  2007  . Dilation and curettage of uterus    . Roux-en-y procedure  03/04/2014    Dr. Saint Lucia  . Breast biopsy Right 2013    benign - fatty tissue  . Breast biopsy Left 06-12-13    fibroadenoma /core biopsy  . Breast biopsy Right 06-17-13    fibrocystic /stereo biopsy    Family History  Problem Relation Age of Onset  . Arthritis Mother   . Diabetes Father   . Hypertension Father   . Polycystic ovary syndrome Sister   . Cancer Maternal Grandmother     breast    Social History History  Substance Use Topics  . Smoking status: Former Research scientist (life sciences)  . Smokeless tobacco: Never Used     Comment: quit 2006  . Alcohol Use: Yes     Comment: rare    Allergies  Allergen Reactions  . Avelox [Moxifloxacin Hcl In Nacl] Palpitations    Current Outpatient Prescriptions  Medication Sig Dispense Refill  . docusate sodium (COLACE) 100 MG capsule Take 1 capsule (100 mg total) by mouth daily.  30 capsule  6  . hydrochlorothiazide (HYDRODIURIL) 25 MG tablet TAKE 1 TABLET (25 MG TOTAL) BY MOUTH DAILY.  90 tablet  3  . pantoprazole (PROTONIX) 40 MG tablet Take 40 mg by mouth daily.        No current facility-administered medications for this visit.    Review of Systems Review of Systems  Constitutional: Negative.   Respiratory: Negative.   Cardiovascular: Negative.     Blood pressure  126/78, pulse 86, resp. rate 12, height 5\' 5"  (1.651 m), weight 227 lb (102.967 kg), last menstrual period 08/19/2014.  Physical Exam Physical Exam  Constitutional: She is oriented to person, place, and time. She appears well-developed and well-nourished.  Pulmonary/Chest: Right breast exhibits no inverted nipple, no mass, no nipple discharge, no skin change and no tenderness. Left breast exhibits no inverted nipple, no mass, no nipple discharge, no skin change and no tenderness.    Lymphadenopathy:    She has no cervical adenopathy.    She has no axillary adenopathy.  Neurological: She is alert and oriented to person, place, and time.  Skin: Skin is warm and dry.    Data Reviewed None  Assessment    improved left breast thickening     Plan    Patient to return in June bilateral screening mammogram.         Christene Lye 09/09/2014, 9:33 AM

## 2014-09-09 NOTE — Patient Instructions (Addendum)
Patient to return in June 2016 bilateral screening mammogram.  Continue monthly self breast exams

## 2014-09-18 ENCOUNTER — Ambulatory Visit (INDEPENDENT_AMBULATORY_CARE_PROVIDER_SITE_OTHER): Payer: BC Managed Care – PPO | Admitting: Psychology

## 2014-09-18 DIAGNOSIS — F4323 Adjustment disorder with mixed anxiety and depressed mood: Secondary | ICD-10-CM

## 2014-09-22 DIAGNOSIS — E668 Other obesity: Secondary | ICD-10-CM | POA: Insufficient documentation

## 2014-09-22 DIAGNOSIS — Z9851 Tubal ligation status: Secondary | ICD-10-CM | POA: Insufficient documentation

## 2014-10-09 ENCOUNTER — Ambulatory Visit (INDEPENDENT_AMBULATORY_CARE_PROVIDER_SITE_OTHER): Payer: BC Managed Care – PPO | Admitting: Psychology

## 2014-10-09 DIAGNOSIS — F4323 Adjustment disorder with mixed anxiety and depressed mood: Secondary | ICD-10-CM

## 2014-10-30 ENCOUNTER — Ambulatory Visit (INDEPENDENT_AMBULATORY_CARE_PROVIDER_SITE_OTHER): Payer: BC Managed Care – PPO | Admitting: Psychology

## 2014-10-30 DIAGNOSIS — F4323 Adjustment disorder with mixed anxiety and depressed mood: Secondary | ICD-10-CM

## 2014-11-17 ENCOUNTER — Ambulatory Visit (INDEPENDENT_AMBULATORY_CARE_PROVIDER_SITE_OTHER): Payer: BC Managed Care – PPO | Admitting: Internal Medicine

## 2014-11-17 ENCOUNTER — Ambulatory Visit: Payer: BC Managed Care – PPO | Admitting: Psychology

## 2014-11-17 ENCOUNTER — Encounter: Payer: Self-pay | Admitting: Internal Medicine

## 2014-11-17 VITALS — BP 110/60 | HR 73 | Temp 98.1°F | Ht 64.5 in | Wt 226.5 lb

## 2014-11-17 DIAGNOSIS — I10 Essential (primary) hypertension: Secondary | ICD-10-CM

## 2014-11-17 DIAGNOSIS — M79651 Pain in right thigh: Secondary | ICD-10-CM

## 2014-11-17 DIAGNOSIS — E669 Obesity, unspecified: Secondary | ICD-10-CM

## 2014-11-17 DIAGNOSIS — L659 Nonscarring hair loss, unspecified: Secondary | ICD-10-CM | POA: Insufficient documentation

## 2014-11-17 DIAGNOSIS — M79652 Pain in left thigh: Secondary | ICD-10-CM

## 2014-11-17 LAB — TSH: TSH: 0.81 u[IU]/mL (ref 0.35–4.50)

## 2014-11-17 LAB — T4, FREE: Free T4: 0.91 ng/dL (ref 0.60–1.60)

## 2014-11-17 MED ORDER — HYDROCHLOROTHIAZIDE 25 MG PO TABS: 25.0000 mg | ORAL_TABLET | Freq: Every day | ORAL | Status: DC

## 2014-11-17 NOTE — Patient Instructions (Signed)
Labs today.  Follow up in 6 months and sooner as needed. 

## 2014-11-17 NOTE — Assessment & Plan Note (Addendum)
Wt Readings from Last 3 Encounters:  11/17/14 226 lb 8 oz (102.74 kg)  09/09/14 227 lb (102.967 kg)  06/12/14 242 lb (109.77 kg)   Encouraged continued effort at healthy diet and exercise.

## 2014-11-17 NOTE — Assessment & Plan Note (Signed)
Mild diffuse thinning of hair. Will check thyroid function with labs. Recent blood counts and Vit B12 and Vit D normal.

## 2014-11-17 NOTE — Progress Notes (Signed)
Pre visit review using our clinic review tool, if applicable. No additional management support is needed unless otherwise documented below in the visit note. 

## 2014-11-17 NOTE — Progress Notes (Signed)
Subjective:    Patient ID: [name], female    DOB: 11-18-1971, 43 y.o.   MRN: [id number]  HPI 43YO female presents for follow up.  HTN - Taking HCTZ occasionally, about 2-3 times per week.  BP Readings from Last 3 Encounters:  11/17/14 110/60  09/09/14 126/78  06/12/14 130/80   Obesity - Taking MVI and iron daily after bariatric surgery. No NV, abdominal pain. Wt Readings from Last 3 Encounters:  11/17/14 226 lb 8 oz (102.74 kg)  09/09/14 227 lb (102.967 kg)  06/12/14 242 lb (109.77 kg)   Notes some thinning of the hair over last several months. Not taking anything for this.  Also notes bilateral lateral thigh pain last 2 months. Wakes her from sleep at times. Worsened by increased activity. No weakness or numbness noted.  Review of Systems  Constitutional: Negative for fever, chills, appetite change, fatigue and unexpected weight change.  Eyes: Negative for visual disturbance.  Respiratory: Negative for shortness of breath.   Cardiovascular: Negative for chest pain and leg swelling.  Gastrointestinal: Negative for nausea, vomiting, abdominal pain, diarrhea and constipation.  Musculoskeletal: Positive for myalgias. Negative for arthralgias and gait problem.  Skin: Negative for color change and rash.  Neurological: Negative for dizziness, weakness, numbness and headaches.  Hematological: Negative for adenopathy. Does not bruise/bleed easily.  Psychiatric/Behavioral: Negative for dysphoric mood. The patient is not nervous/anxious.        Objective:    BP 110/60 mmHg  Pulse 73  Temp(Src) 98.1 F (36.7 C) (Oral)  Ht 5' 4.5" (1.638 m)  Wt 226 lb 8 oz (102.74 kg)  BMI 38.29 kg/m2  SpO2 97%  LMP 11/02/2014 Physical Exam  Constitutional: She is oriented to person, place, and time. She appears well-developed and well-nourished. No distress.  HENT:  Head: Normocephalic and atraumatic.  Right Ear: External ear normal.  Left Ear: External ear normal.  Nose: Nose  normal.  Mouth/Throat: Oropharynx is clear and moist. No oropharyngeal exudate.  Eyes: Conjunctivae are normal. Pupils are equal, round, and reactive to light. Right eye exhibits no discharge. Left eye exhibits no discharge. No scleral icterus.  Neck: Normal range of motion. Neck supple. No tracheal deviation present. No thyromegaly present.  Cardiovascular: Normal rate, regular rhythm, normal heart sounds and intact distal pulses.  Exam reveals no gallop and no friction rub.   No murmur heard. Pulmonary/Chest: Effort normal and breath sounds normal. No accessory muscle usage. No tachypnea. No respiratory distress. She has no decreased breath sounds. She has no wheezes. She has no rhonchi. She has no rales. She exhibits no tenderness.  Musculoskeletal: Normal range of motion. She exhibits no edema or tenderness.  Lymphadenopathy:    She has no cervical adenopathy.  Neurological: She is alert and oriented to person, place, and time. No cranial nerve deficit. She exhibits normal muscle tone. Coordination normal.  Skin: Skin is warm and dry. No rash noted. She is not diaphoretic. No erythema. No pallor.  Psychiatric: She has a normal mood and affect. Her behavior is normal. Judgment and thought content normal.          Assessment & Plan:   Problem List Items Addressed This Visit      Unprioritized   Bilateral thigh pain    Bilateral lateral thigh pain. Description most consistent with IT band syndrome. Discussed stretching and using a rollerbar. Unable to use NSAIDS because of bariatric surgery. Will use prn Tylenol. Follow up if symptoms are not improving.  Hair loss    Mild diffuse thinning of hair. Will check thyroid function with labs. Recent blood counts and Vit B12 and Vit D normal.    Relevant Orders      TSH      T4, free   Hypertension - Primary    BP Readings from Last 3 Encounters:  11/17/14 110/60  09/09/14 126/78  06/12/14 130/80   BP well controlled with only prn  use of HCTZ. Recent renal function was normal.     Relevant Medications      hydrochlorothiazide tablet   Obesity    Wt Readings from Last 3 Encounters:  11/17/14 226 lb 8 oz (102.74 kg)  09/09/14 227 lb (102.967 kg)  06/12/14 242 lb (109.77 kg)   Encouraged continued effort at healthy diet and exercise.        Return in about 6 months (around 05/18/2015) for Recheck.

## 2014-11-17 NOTE — Assessment & Plan Note (Signed)
BP Readings from Last 3 Encounters:  11/17/14 110/60  09/09/14 126/78  06/12/14 130/80   BP well controlled with only prn use of HCTZ. Recent renal function was normal.

## 2014-11-17 NOTE — Assessment & Plan Note (Signed)
Bilateral lateral thigh pain. Description most consistent with IT band syndrome. Discussed stretching and using a rollerbar. Unable to use NSAIDS because of bariatric surgery. Will use prn Tylenol. Follow up if symptoms are not improving.

## 2015-03-19 ENCOUNTER — Other Ambulatory Visit: Payer: Self-pay | Admitting: Internal Medicine

## 2015-04-17 ENCOUNTER — Other Ambulatory Visit: Payer: Self-pay

## 2015-04-17 DIAGNOSIS — Z1231 Encounter for screening mammogram for malignant neoplasm of breast: Secondary | ICD-10-CM

## 2015-05-25 ENCOUNTER — Ambulatory Visit (INDEPENDENT_AMBULATORY_CARE_PROVIDER_SITE_OTHER): Payer: BLUE CROSS/BLUE SHIELD | Admitting: Internal Medicine

## 2015-05-25 ENCOUNTER — Encounter: Payer: Self-pay | Admitting: Internal Medicine

## 2015-05-25 VITALS — BP 100/60 | HR 90 | Temp 98.1°F | Ht 64.5 in | Wt 233.4 lb

## 2015-05-25 DIAGNOSIS — H9190 Unspecified hearing loss, unspecified ear: Secondary | ICD-10-CM | POA: Insufficient documentation

## 2015-05-25 DIAGNOSIS — E669 Obesity, unspecified: Secondary | ICD-10-CM | POA: Diagnosis not present

## 2015-05-25 DIAGNOSIS — H9191 Unspecified hearing loss, right ear: Secondary | ICD-10-CM

## 2015-05-25 DIAGNOSIS — Z0000 Encounter for general adult medical examination without abnormal findings: Secondary | ICD-10-CM

## 2015-05-25 LAB — CBC WITH DIFFERENTIAL/PLATELET
Basophils Absolute: 0.1 10*3/uL (ref 0.0–0.1)
Basophils Relative: 0.7 % (ref 0.0–3.0)
Eosinophils Absolute: 0.7 10*3/uL (ref 0.0–0.7)
Eosinophils Relative: 7.5 % — ABNORMAL HIGH (ref 0.0–5.0)
HCT: 37.8 % (ref 36.0–46.0)
Hemoglobin: 12.6 g/dL (ref 12.0–15.0)
Lymphocytes Relative: 30 % (ref 12.0–46.0)
Lymphs Abs: 2.8 10*3/uL (ref 0.7–4.0)
MCHC: 33.5 g/dL (ref 30.0–36.0)
MCV: 88.9 fl (ref 78.0–100.0)
Monocytes Absolute: 0.5 10*3/uL (ref 0.1–1.0)
Monocytes Relative: 5.2 % (ref 3.0–12.0)
Neutro Abs: 5.3 10*3/uL (ref 1.4–7.7)
Neutrophils Relative %: 56.6 % (ref 43.0–77.0)
Platelets: 255 10*3/uL (ref 150.0–400.0)
RBC: 4.24 Mil/uL (ref 3.87–5.11)
RDW: 13.2 % (ref 11.5–15.5)
WBC: 9.4 10*3/uL (ref 4.0–10.5)

## 2015-05-25 LAB — COMPREHENSIVE METABOLIC PANEL
ALT: 15 U/L (ref 0–35)
AST: 20 U/L (ref 0–37)
Albumin: 3.9 g/dL (ref 3.5–5.2)
Alkaline Phosphatase: 104 U/L (ref 39–117)
BUN: 16 mg/dL (ref 6–23)
CO2: 31 mEq/L (ref 19–32)
Calcium: 9.1 mg/dL (ref 8.4–10.5)
Chloride: 103 mEq/L (ref 96–112)
Creatinine, Ser: 0.58 mg/dL (ref 0.40–1.20)
GFR: 119.98 mL/min (ref >60.00)
Glucose, Bld: 65 mg/dL — ABNORMAL LOW (ref 70–99)
Potassium: 3.3 mEq/L — ABNORMAL LOW (ref 3.5–5.1)
Sodium: 138 mEq/L (ref 135–145)
Total Bilirubin: 0.4 mg/dL (ref 0.2–1.2)
Total Protein: 6.4 g/dL (ref 6.0–8.3)

## 2015-05-25 LAB — VITAMIN B12: Vitamin B-12: 521 pg/mL (ref 211–911)

## 2015-05-25 LAB — LIPID PANEL
Cholesterol: 140 mg/dL (ref 0–200)
HDL: 51.3 mg/dL (ref >39.00)
LDL Cholesterol: 74 mg/dL (ref 0–99)
NonHDL: 88.7
Total CHOL/HDL Ratio: 3
Triglycerides: 74 mg/dL (ref 0.0–149.0)
VLDL: 14.8 mg/dL (ref 0.0–40.0)

## 2015-05-25 LAB — MICROALBUMIN / CREATININE URINE RATIO
Creatinine,U: 16.7 mg/dL
Microalb Creat Ratio: 4.2 mg/g (ref 0.0–30.0)
Microalb, Ur: <0.7 mg/dL (ref 0.0–1.9)

## 2015-05-25 LAB — TSH: TSH: 0.72 u[IU]/mL (ref 0.35–4.50)

## 2015-05-25 LAB — VITAMIN D 25 HYDROXY (VIT D DEFICIENCY, FRACTURES): VITD: 29.34 ng/mL — ABNORMAL LOW (ref 30.00–100.00)

## 2015-05-25 NOTE — Progress Notes (Signed)
Pre visit review using our clinic review tool, if applicable. No additional management support is needed unless otherwise documented below in the visit note. 

## 2015-05-25 NOTE — Assessment & Plan Note (Signed)
General medical exam normal today including breast exam. PAP and pelvic deferred as normal in 2015, HPV neg. Mammogram scheduled. Labs as ordered. Encouraged healthy diet and exercise.

## 2015-05-25 NOTE — Assessment & Plan Note (Signed)
Right ear hearing change. Exam is normal. Will set up ENT evaluation. Suspect eustachian tube dysfunction.

## 2015-05-25 NOTE — Patient Instructions (Signed)

## 2015-05-25 NOTE — Assessment & Plan Note (Signed)
Wt Readings from Last 3 Encounters:  05/25/15 233 lb 6 oz (105.858 kg)  11/17/14 226 lb 8 oz (102.74 kg)  09/09/14 227 lb (102.967 kg)   Body mass index is 39.45 kg/(m^2). The patient is asked to make an attempt to improve diet and exercise patterns to aid in medical management of this problem.

## 2015-05-25 NOTE — Progress Notes (Signed)
Subjective:    Patient ID: [name], female    DOB: 1971/11/14, 44 y.o.   MRN: [id number]  HPI  44YO female presents for annual exam.  Feeling well in general. Mammogram scheduled.  Notes some right ear hearing changes over last few weeks. Occasionally feels that sound is muffled, or there is water in her ear. No ear pain, fever, chills, congestion.  Wt Readings from Last 3 Encounters:  05/25/15 233 lb 6 oz (105.858 kg)  11/17/14 226 lb 8 oz (102.74 kg)  09/09/14 227 lb (102.967 kg)     Past medical, surgical, family and social history per today's encounter.  Review of Systems  Constitutional: Negative for fever, chills, appetite change, fatigue and unexpected weight change.  Eyes: Negative for visual disturbance.  Respiratory: Negative for shortness of breath.   Cardiovascular: Negative for chest pain and leg swelling.  Gastrointestinal: Negative for nausea, vomiting, abdominal pain, diarrhea and constipation.  Musculoskeletal: Negative for myalgias and arthralgias.  Skin: Negative for color change and rash.  Hematological: Negative for adenopathy. Does not bruise/bleed easily.  Psychiatric/Behavioral: Negative for sleep disturbance and dysphoric mood. The patient is not nervous/anxious.        Objective:    BP 100/60 mmHg  Pulse 90  Temp(Src) 98.1 F (36.7 C) (Oral)  Ht 5' 4.5" (1.638 m)  Wt 233 lb 6 oz (105.858 kg)  BMI 39.45 kg/m2  SpO2 100%  LMP 05/22/2015 Physical Exam  Constitutional: She is oriented to person, place, and time. She appears well-developed and well-nourished. No distress.  HENT:  Head: Normocephalic and atraumatic.  Right Ear: External ear normal.  Left Ear: External ear normal.  Nose: Nose normal.  Mouth/Throat: Oropharynx is clear and moist. No oropharyngeal exudate.  Eyes: Conjunctivae are normal. Pupils are equal, round, and reactive to light. Right eye exhibits no discharge. Left eye exhibits no discharge. No scleral icterus.    Neck: Normal range of motion. Neck supple. No tracheal deviation present. No thyromegaly present.  Cardiovascular: Normal rate, regular rhythm, normal heart sounds and intact distal pulses.  Exam reveals no gallop and no friction rub.   No murmur heard. Pulmonary/Chest: Effort normal and breath sounds normal. No accessory muscle usage. No tachypnea. No respiratory distress. She has no decreased breath sounds. She has no wheezes. She has no rales. She exhibits no tenderness. Right breast exhibits no inverted nipple, no mass, no nipple discharge, no skin change and no tenderness. Left breast exhibits no inverted nipple, no mass, no nipple discharge, no skin change and no tenderness. Breasts are symmetrical.  Abdominal: Soft. Bowel sounds are normal. She exhibits no distension and no mass. There is no tenderness. There is no rebound and no guarding.  Musculoskeletal: Normal range of motion. She exhibits no edema or tenderness.  Lymphadenopathy:    She has no cervical adenopathy.  Neurological: She is alert and oriented to person, place, and time. No cranial nerve deficit. She exhibits normal muscle tone. Coordination normal.  Skin: Skin is warm and dry. No rash noted. She is not diaphoretic. No erythema. No pallor.  Psychiatric: She has a normal mood and affect. Her behavior is normal. Judgment and thought content normal.          Assessment & Plan:   Problem List Items Addressed This Visit      Unprioritized   Hearing loss    Right ear hearing change. Exam is normal. Will set up ENT evaluation. Suspect eustachian tube dysfunction.  Relevant Orders   Ambulatory referral to ENT   Obesity    Wt Readings from Last 3 Encounters:  05/25/15 233 lb 6 oz (105.858 kg)  11/17/14 226 lb 8 oz (102.74 kg)  09/09/14 227 lb (102.967 kg)   Body mass index is 39.45 kg/(m^2). The patient is asked to make an attempt to improve diet and exercise patterns to aid in medical management of this  problem.       Routine general medical examination at a health care facility - Primary    General medical exam normal today including breast exam. PAP and pelvic deferred as normal in 2015, HPV neg. Mammogram scheduled. Labs as ordered. Encouraged healthy diet and exercise.       Relevant Orders   CBC with Differential/Platelet   Comprehensive metabolic panel   Lipid panel   TSH   Vit D  25 hydroxy (rtn osteoporosis monitoring)   Microalbumin / creatinine urine ratio   B12       Return in about 6 months (around 11/24/2015) for Recheck.

## 2015-06-15 ENCOUNTER — Ambulatory Visit (INDEPENDENT_AMBULATORY_CARE_PROVIDER_SITE_OTHER): Payer: BLUE CROSS/BLUE SHIELD | Admitting: General Surgery

## 2015-06-15 ENCOUNTER — Encounter: Payer: Self-pay | Admitting: General Surgery

## 2015-06-15 DIAGNOSIS — N6011 Diffuse cystic mastopathy of right breast: Secondary | ICD-10-CM | POA: Diagnosis not present

## 2015-06-15 DIAGNOSIS — D242 Benign neoplasm of left breast: Secondary | ICD-10-CM | POA: Diagnosis not present

## 2015-06-15 NOTE — Patient Instructions (Addendum)
Continue self breast exams. Call office for any new breast issues or concerns.The patient has been asked to return to the office in one year with a bilateral screening mammogram. 

## 2015-06-15 NOTE — Progress Notes (Signed)
Patient ID: [name], female   DOB: 01-Mar-1971, 44 y.o.   MRN: [id number]  Chief Complaint  Patient presents with  . Follow-up    mammogram    HPI [name] is a 44 y.o. female who presents for a breast evaluation. The most recent mammogram was done on 06/08/15.  Patient does perform regular self breast checks and gets regular mammograms done. Has lost 85 pounds since Roux-en-y procedure in 2015.   HPI  Past Medical History  Diagnosis Date  . Hypertension   . Thyroid disease   . Blood in urine 2014  . Breast mass 2013    Past Surgical History  Procedure Laterality Date  . Cesarean section  2007  . Dilation and curettage of uterus    . Roux-en-y procedure  03/04/2014    Dr. Saint Lucia  . Breast biopsy Right 2013    benign - fatty tissue  . Breast biopsy Left 06-12-13    fibroadenoma /core biopsy  . Breast biopsy Right 06-17-13    fibrocystic /stereo biopsy    Family History  Problem Relation Age of Onset  . Arthritis Mother   . Diabetes Father   . Hypertension Father   . Polycystic ovary syndrome Sister   . Cancer Maternal Grandmother     breast    Social History History  Substance Use Topics  . Smoking status: Former Research scientist (life sciences)  . Smokeless tobacco: Never Used     Comment: quit 2006  . Alcohol Use: Yes     Comment: rare    Allergies  Allergen Reactions  . Avelox [Moxifloxacin Hcl In Nacl] Palpitations    Current Outpatient Prescriptions  Medication Sig Dispense Refill  . hydrochlorothiazide (HYDRODIURIL) 25 MG tablet Take 1 tablet (25 mg total) by mouth daily. 90 tablet 3   No current facility-administered medications for this visit.    Review of Systems Review of Systems  Constitutional: Negative.   Respiratory: Negative.   Cardiovascular: Negative.     Blood pressure 124/74, pulse 76, resp. rate 12, height 5\' 4"  (1.626 m), weight 231 lb (104.781 kg), last menstrual period 05/22/2015.  Physical Exam Physical Exam  Constitutional: She is  oriented to person, place, and time. She appears well-developed and well-nourished.  Eyes: Conjunctivae are normal. No scleral icterus.  Neck: Neck supple.  Cardiovascular: Normal rate, regular rhythm and normal heart sounds.   Pulmonary/Chest: Effort normal and breath sounds normal. Right breast exhibits no inverted nipple, no mass, no nipple discharge, no skin change and no tenderness. Left breast exhibits no inverted nipple, no mass, no nipple discharge, no skin change and no tenderness.  Abdominal: Soft. Bowel sounds are normal. There is no hepatomegaly. There is no tenderness.  Lymphadenopathy:    She has no cervical adenopathy.    She has no axillary adenopathy.  Neurological: She is alert and oriented to person, place, and time.  Skin: Skin is warm and dry.    Data Reviewed Mammogram reviewed, stable.  Assessment    Fibrocystic disease of right breast. Fibroadenoma of left breast. Stable exam and mammogram. Remote family history of breast cancer.     Plan    Continue self breast exams. Call office for any new breast issues or concerns. The patient has been asked to return to the office in one year with a bilateral screening mammogram.      PCP:  Johnny Bridge 06/15/2015, 10:10 AM

## 2015-07-21 ENCOUNTER — Encounter: Payer: Self-pay | Admitting: General Surgery

## 2015-07-21 ENCOUNTER — Ambulatory Visit (INDEPENDENT_AMBULATORY_CARE_PROVIDER_SITE_OTHER): Payer: BLUE CROSS/BLUE SHIELD | Admitting: General Surgery

## 2015-07-21 VITALS — BP 110/70 | HR 70 | Resp 12 | Ht 64.0 in | Wt 237.0 lb

## 2015-07-21 DIAGNOSIS — I8393 Asymptomatic varicose veins of bilateral lower extremities: Secondary | ICD-10-CM | POA: Diagnosis not present

## 2015-07-21 NOTE — Patient Instructions (Signed)
Wear thigh length compression hose daily Tylenol as needed for comfort/leg pain

## 2015-07-21 NOTE — Progress Notes (Signed)
Patient ID: [name], female   DOB: 1971/11/09, 44 y.o.   MRN: [id number]  Chief Complaint  Patient presents with  . Varicose Veins    HPI [name] is a 44 y.o. female.  here for varicous veins evalaution. She states she has had some leg swelling over several years. She states she has two areas on the left leg that itch and hurt, and one vein near her right knee that bothers her. She has lost 90 pounds since her Roux-en-y surgery last year. She does not wear compression hose she has tried them in the past for ankle swelling.  States that she has burning and itching sensation especially in left leg, more noticeable in the afternoon. Also, states that she has numbness in her left leg occasionally. Does sit a lot at her job.     HPI  Past Medical History  Diagnosis Date  . Hypertension   . Thyroid disease   . Blood in urine 2014  . Breast mass 2013    Past Surgical History  Procedure Laterality Date  . Cesarean section  2007  . Dilation and curettage of uterus    . Roux-en-y procedure  03/04/2014    Dr. Saint Lucia  . Breast biopsy Right 2013    benign - fatty tissue  . Breast biopsy Left 06-12-13    fibroadenoma /core biopsy  . Breast biopsy Right 06-17-13    fibrocystic /stereo biopsy    Family History  Problem Relation Age of Onset  . Arthritis Mother   . Diabetes Father   . Hypertension Father   . Polycystic ovary syndrome Sister   . Cancer Maternal Grandmother     breast    Social History History  Substance Use Topics  . Smoking status: Former Research scientist (life sciences)  . Smokeless tobacco: Never Used     Comment: quit 2006  . Alcohol Use: Yes     Comment: rare    Allergies  Allergen Reactions  . Avelox [Moxifloxacin Hcl In Nacl] Palpitations    Current Outpatient Prescriptions  Medication Sig Dispense Refill  . hydrochlorothiazide (HYDRODIURIL) 25 MG tablet Take 1 tablet (25 mg total) by mouth daily. 90 tablet 3   No current facility-administered medications for  this visit.    Review of Systems Review of Systems  Constitutional: Negative.   Respiratory: Negative.   Cardiovascular: Negative.   Gastrointestinal: Negative.     Blood pressure 110/70, pulse 70, resp. rate 12, height 5\' 4"  (1.626 m), weight 237 lb (107.502 kg), last menstrual period 07/07/2015.  Physical Exam Physical Exam  Constitutional: She is oriented to person, place, and time. She appears well-developed and well-nourished.  HENT:  Mouth/Throat: Oropharynx is clear and moist.  Eyes: Conjunctivae are normal. No scleral icterus.  Cardiovascular:  Pulses:      Dorsalis pedis pulses are 2+ on the right side, and 2+ on the left side.       Posterior tibial pulses are 2+ on the right side, and 2+ on the left side.  No lower leg edema or skin changes.  Varicose vein in left upper thigh medially, palpable more than visible. Cluster of varicose veins up to 1 cm mid left leg anteromedial. Smaller cluster in front of right knee.   Neurological: She is alert and oriented to person, place, and time.  Skin: Skin is warm and dry.  Psychiatric: Her behavior is normal.  No skin changes noted in legs  Data Reviewed   Assessment    Varicose  veins, symptomatic.      Plan    Discussed full spectrum of symptoms and treatment for varicose veins. At present, she would benefit from a trial with compression stockings with a compression of 20-30 mmHg. Proper usage explained to patient. Rx given.  Follow up in one month for Duplex scan.      PCP:  Johnny Bridge 07/21/2015, 11:18 AM

## 2015-08-26 ENCOUNTER — Ambulatory Visit: Payer: BLUE CROSS/BLUE SHIELD | Admitting: General Surgery

## 2015-09-09 ENCOUNTER — Encounter: Payer: Self-pay | Admitting: General Surgery

## 2015-09-09 ENCOUNTER — Other Ambulatory Visit: Payer: BLUE CROSS/BLUE SHIELD

## 2015-09-09 ENCOUNTER — Ambulatory Visit (INDEPENDENT_AMBULATORY_CARE_PROVIDER_SITE_OTHER): Payer: BLUE CROSS/BLUE SHIELD | Admitting: General Surgery

## 2015-09-09 VITALS — BP 128/74 | HR 76 | Resp 14 | Ht 65.0 in | Wt 237.0 lb

## 2015-09-09 DIAGNOSIS — I8393 Asymptomatic varicose veins of bilateral lower extremities: Secondary | ICD-10-CM

## 2015-09-09 NOTE — Progress Notes (Signed)
This is a 44 year old female here today following up with varicose veins. She has been wearing her compression hose and feels they are helping a little. Patient states her legs are better but her muscles are sore when laying down. She also complains that at times her lower legs feels "wooden," like "they are not real"; this does not happen every day but when it does, it lasts all day.  Pedal pulses intact bilaterally.  Duplex study showed no problems in deep veins. Lateral branch of left GSV and main GSV on right with prolonged reflux. Assessment:  Reflux of left and right saphenous veins Back problems causing sore thigh muscles  Plan: Take Tylenol for inflammation daily for several weeks. Continue wearing compression socks. Return in 6 weeks. Pt advised to have her back checked out.

## 2015-09-09 NOTE — Patient Instructions (Addendum)
Tylenol daily for inflammation. Continue compression socks. Return in 6 weeks.

## 2015-09-24 ENCOUNTER — Ambulatory Visit: Payer: Self-pay | Admitting: Obstetrics and Gynecology

## 2015-10-01 ENCOUNTER — Ambulatory Visit (INDEPENDENT_AMBULATORY_CARE_PROVIDER_SITE_OTHER): Payer: BLUE CROSS/BLUE SHIELD | Admitting: Obstetrics and Gynecology

## 2015-10-01 ENCOUNTER — Encounter: Payer: Self-pay | Admitting: Obstetrics and Gynecology

## 2015-10-01 VITALS — BP 119/75 | HR 80 | Ht 65.0 in | Wt 235.0 lb

## 2015-10-01 DIAGNOSIS — Z202 Contact with and (suspected) exposure to infections with a predominantly sexual mode of transmission: Secondary | ICD-10-CM | POA: Diagnosis not present

## 2015-10-01 DIAGNOSIS — N898 Other specified noninflammatory disorders of vagina: Secondary | ICD-10-CM | POA: Diagnosis not present

## 2015-10-01 NOTE — Patient Instructions (Signed)
1.  Wet prep today does not reveal any infection. 2.  STI screen is completed today. 3.  Follow-up as needed if recurrent vaginal discharge develops.

## 2015-10-01 NOTE — Progress Notes (Signed)
Patient ID: [name], female   DOB: [birth year], 44 y.o.   MRN: [id number] [name] d/c with odor- on/off- 6 months No burning no itch  Chief complaint: 1.  Chronic vaginal discharge.  The patient is a 44 year old married white female, para 2012, having regular cycles every 21-28 days without dysmenorrhea, presents for evaluation of a six-month history of chronic recurrent brown vaginal discharge which occurs close to her menstrual cycle.  She has been treated for BV in the past on several occasions.  This discharge tends to recur and soil her panty liners.  Patient has not any recent treatment for this condition. Patient denies chronic itching or burning.  She does experience occasional odor. Patient states that her husband was unfaithful approximate 1 year ago and she did have an STI screen which was negative.  She has not had any follow-up STI screening.  They're currently together and monogamous.  Past Medical History  Diagnosis Date  . Thyroid disease   . Blood in urine 2014  . Breast mass 2013  . BV (bacterial vaginosis)   . Hypertension   . Edema     legs   Past Surgical History  Procedure Laterality Date  . Cesarean section  2007  . Dilation and curettage of uterus    . Roux-en-y procedure  03/04/2014    Dr. Saint Jimenez  . Breast biopsy Right 2013    benign - fatty tissue  . Breast biopsy Left 06-12-13    fibroadenoma /core biopsy  . Breast biopsy Right 06-17-13    fibrocystic /stereo biopsy    Review of systems: Per HPI.  OBJECTIVE: BP 119/75 mmHg  Pulse 80  Ht 5\' 5"  (1.651 m)  Wt 235 lb (106.595 kg)  BMI 39.11 kg/m2  LMP 09/23/2015 (Exact Date) Patient is a pleasant, well-appearing white female in no acute distress.  She is alert and oriented. Back: No CVA tenderness. Abdomen: Soft, nontender. Skin: No rash. Pelvic exam: External genitalia normal BUS-normal Vagina-normal, with minimal white secretions. Cervix-parous; no lesions; no cervical motion  tenderness Uterus-normal. Rectovaginal-normal.  External exam  PROCEDURE: Wet prep.  KOH-no hyphae.  Normal saline-few white blood cells; no trichomonas; no clue cells; normal epithelial cells.  IMPRESSION: 1.  Chronic brown vaginal discharge, unclear etiology, appears to be cyclic and related to menses. 2.  Needs repeat STI screening. 3.  Wet prep negative for abnormality today.  PLAN: 1.  Wet prep completed. 2.   nu Swab sent 3.  STI screen completed. 4.  She'll be notified by phone of results. 5.  Patient is to return as needed if her recurrent discharge develops.  Shelby Mars, MD

## 2015-10-02 LAB — HEPATITIS B SURFACE ANTIGEN: Hepatitis B Surface Ag: NEGATIVE

## 2015-10-02 LAB — RPR: RPR Ser Ql: NONREACTIVE

## 2015-10-02 LAB — HIV ANTIBODY (ROUTINE TESTING W REFLEX): HIV Screen 4th Generation wRfx: NONREACTIVE

## 2015-10-02 LAB — HEPATITIS C ANTIBODY: Hep C Virus Ab: <0.1 s/co ratio (ref 0.0–0.9)

## 2015-10-03 LAB — HSV(HERPES SMPLX)ABS-I+II(IGG+IGM)-BLD: HSVI/II Comb IgM: <0.91 Ratio (ref 0.00–0.90)

## 2015-10-06 LAB — NUSWAB VAGINITIS PLUS (VG+)
Candida albicans, NAA: NEGATIVE
Candida glabrata, NAA: NEGATIVE

## 2015-10-07 ENCOUNTER — Telehealth: Payer: Self-pay | Admitting: Obstetrics and Gynecology

## 2015-10-07 NOTE — Telephone Encounter (Signed)
Pt  Called about her pap and lab results

## 2015-10-07 NOTE — Telephone Encounter (Signed)
Pt aware results pending- hsv 1/2

## 2015-10-08 LAB — HSV 1 AND 2 IGM ABS, INDIRECT
HSV 1 IgM: <1:10 {titer}
HSV 2 IgM: <1:10 {titer}

## 2015-10-08 LAB — SPECIMEN STATUS REPORT

## 2015-10-13 NOTE — Telephone Encounter (Signed)
Called pt-see result note  

## 2015-10-22 ENCOUNTER — Ambulatory Visit (INDEPENDENT_AMBULATORY_CARE_PROVIDER_SITE_OTHER): Payer: BLUE CROSS/BLUE SHIELD | Admitting: General Surgery

## 2015-10-22 ENCOUNTER — Encounter: Payer: Self-pay | Admitting: General Surgery

## 2015-10-22 VITALS — BP 130/70 | HR 76 | Resp 14 | Ht 65.0 in | Wt 238.0 lb

## 2015-10-22 DIAGNOSIS — I8393 Asymptomatic varicose veins of bilateral lower extremities: Secondary | ICD-10-CM | POA: Diagnosis not present

## 2015-10-22 NOTE — Progress Notes (Signed)
This is a 44 year old female here today for her follow up varicose veins. Patient states she has been wearing her compassion hose. C/o daily leg heaviness (L > R) as well as LE edema, concerned about long term progression.  Overall she reports only minimal improvement of left leg symptoms-heaviness, aching, swelling. She is using Tylenol frequently at day's end. I have reviewed the history of present illness with the patient.  Stable exam, superficial varicosities L > R with trace edema in LEs. VV up to 1cm size. Duplex study has shown no deep venous abnormality. Lateral left GSV  And right GSV are abnormal.  BOTH LSV are normal.   Discussed options thoroughly with patient - left saphenous vein ablation of  vs. Continuation of current regimen of compression hose and elevation. Patient is interested in pursuing endovenous ablation.   Schedule procedure for left leg. Encouraged patient to continue routinely wearing compression hose, elevating legs, and exercising regularly for symptomatic relief in the meantime.

## 2015-11-17 ENCOUNTER — Ambulatory Visit (INDEPENDENT_AMBULATORY_CARE_PROVIDER_SITE_OTHER): Payer: BLUE CROSS/BLUE SHIELD | Admitting: Internal Medicine

## 2015-11-17 ENCOUNTER — Encounter: Payer: Self-pay | Admitting: Internal Medicine

## 2015-11-17 VITALS — BP 122/74 | HR 77 | Temp 98.4°F | Ht 65.0 in | Wt 239.2 lb

## 2015-11-17 DIAGNOSIS — I868 Varicose veins of other specified sites: Secondary | ICD-10-CM

## 2015-11-17 DIAGNOSIS — E669 Obesity, unspecified: Secondary | ICD-10-CM | POA: Diagnosis not present

## 2015-11-17 DIAGNOSIS — N951 Menopausal and female climacteric states: Secondary | ICD-10-CM

## 2015-11-17 DIAGNOSIS — I8390 Asymptomatic varicose veins of unspecified lower extremity: Secondary | ICD-10-CM

## 2015-11-17 DIAGNOSIS — I10 Essential (primary) hypertension: Secondary | ICD-10-CM | POA: Diagnosis not present

## 2015-11-17 DIAGNOSIS — I83819 Varicose veins of unspecified lower extremities with pain: Secondary | ICD-10-CM | POA: Insufficient documentation

## 2015-11-17 LAB — COMPREHENSIVE METABOLIC PANEL
ALT: 12 U/L (ref 0–35)
AST: 15 U/L (ref 0–37)
Albumin: 3.8 g/dL (ref 3.5–5.2)
Alkaline Phosphatase: 95 U/L (ref 39–117)
BUN: 14 mg/dL (ref 6–23)
CO2: 31 mEq/L (ref 19–32)
Calcium: 9.3 mg/dL (ref 8.4–10.5)
Chloride: 104 mEq/L (ref 96–112)
Creatinine, Ser: 0.67 mg/dL (ref 0.40–1.20)
GFR: 101.36 mL/min (ref >60.00)
Glucose, Bld: 77 mg/dL (ref 70–99)
Potassium: 3.8 mEq/L (ref 3.5–5.1)
Sodium: 141 mEq/L (ref 135–145)
Total Bilirubin: 0.5 mg/dL (ref 0.2–1.2)
Total Protein: 6.4 g/dL (ref 6.0–8.3)

## 2015-11-17 MED ORDER — HYDROCHLOROTHIAZIDE 25 MG PO TABS: 25.0000 mg | ORAL_TABLET | Freq: Every day | ORAL | Status: DC

## 2015-11-17 MED ORDER — BUPROPION HCL ER (XL) 150 MG PO TB24: 150.0000 mg | ORAL_TABLET | Freq: Every day | ORAL | Status: DC

## 2015-11-17 NOTE — Assessment & Plan Note (Signed)
Symptoms consistent with menopause. Discussed some options including starting SSRI. Will start Wellbutrin daily. Follow up in 4 weeks and as needed.

## 2015-11-17 NOTE — Progress Notes (Signed)
Pre visit review using our clinic review tool, if applicable. No additional management support is needed unless otherwise documented below in the visit note. 

## 2015-11-17 NOTE — Assessment & Plan Note (Addendum)
Reviewed notes from Dr. Jamal Collin. Discussed pros and cons of vein ablation. Encouraged her to wear compression hose. Follow up with Dr. Jamal Collin as scheduled.

## 2015-11-17 NOTE — Assessment & Plan Note (Signed)
Wt Readings from Last 3 Encounters:  11/17/15 239 lb 4 oz (108.523 kg)  10/22/15 238 lb (107.956 kg)  10/01/15 235 lb (106.595 kg)   Encouraged healthy diet and exercise.

## 2015-11-17 NOTE — Patient Instructions (Signed)
Start Wellbutrin 150mg  daily in the morning.  Labs today.  Follow up 4 weeks.

## 2015-11-17 NOTE — Assessment & Plan Note (Signed)
BP Readings from Last 3 Encounters:  11/17/15 122/74  10/22/15 130/70  10/01/15 119/75   BP well controlled. Renal function with labs. Continue HCTZ.

## 2015-11-17 NOTE — Progress Notes (Signed)
Subjective:    Patient ID: [name], female    DOB: 1971-10-23, 44 y.o.   MRN: [id number]  HPI  44YO female presents for follow up.  Varicose veins - Started wearing compression hose. Painful at times. Feels like band around knee. Planning to perform left saphenous vein ablation, however insurance denied.  HTN - Compliant with medication. No CP, HA, palpitations.  Notes mood swings lately. Feels that she is more quick to get angry or feel sad. Menses are heavier than previous. Still regular menses, every 21 days. Also notes some difficulty focusing at work over last few months. Occasional vaginal dryness just before her menses. No dyspareunia. Not taking anything for these symptoms.   Wt Readings from Last 3 Encounters:  11/17/15 239 lb 4 oz (108.523 kg)  10/22/15 238 lb (107.956 kg)  10/01/15 235 lb (106.595 kg)   BP Readings from Last 3 Encounters:  11/17/15 122/74  10/22/15 130/70  10/01/15 119/75    Past Medical History  Diagnosis Date  . Thyroid disease   . Blood in urine 2014  . Breast mass 2013  . BV (bacterial vaginosis)   . Hypertension   . Edema     legs   Family History  Problem Relation Age of Onset  . Arthritis Mother   . Diabetes Father   . Hypertension Father   . Polycystic ovary syndrome Sister   . Cancer Maternal Grandmother     breast  . Breast cancer Neg Hx   . Ovarian cancer Neg Hx    Past Surgical History  Procedure Laterality Date  . Cesarean section  2007  . Dilation and curettage of uterus    . Roux-en-y procedure  03/04/2014    Dr. Saint Lucia  . Breast biopsy Right 2013    benign - fatty tissue  . Breast biopsy Left 06-12-13    fibroadenoma /core biopsy  . Breast biopsy Right 06-17-13    fibrocystic Herbie Drape biopsy   Social History   Social History  . Marital Status: Married    Spouse Name: N/A  . Number of Children: N/A  . Years of Education: N/A   Social History Main Topics  . Smoking status: Former Research scientist (life sciences)  .  Smokeless tobacco: Never Used     Comment: quit 2006  . Alcohol Use: Yes     Comment: rare  . Drug Use: No  . Sexual Activity: Yes    Birth Control/ Protection: None   Other Topics Concern  . None   Social History Narrative    Review of Systems  Constitutional: Negative for fever, chills, appetite change, fatigue and unexpected weight change.  Eyes: Negative for visual disturbance.  Respiratory: Negative for cough and shortness of breath.   Cardiovascular: Negative for chest pain and leg swelling.  Gastrointestinal: Negative for nausea, vomiting, abdominal pain, diarrhea and constipation.  Genitourinary: Positive for vaginal discharge (occasional) and menstrual problem. Negative for dysuria, urgency, frequency, vaginal bleeding, genital sores, pelvic pain and dyspareunia.  Musculoskeletal: Negative for myalgias and arthralgias.  Skin: Negative for color change and rash.  Hematological: Negative for adenopathy. Does not bruise/bleed easily.  Psychiatric/Behavioral: Positive for dysphoric mood and decreased concentration. Negative for sleep disturbance. The patient is nervous/anxious.        Objective:    BP 122/74 mmHg  Pulse 77  Temp(Src) 98.4 F (36.9 C) (Oral)  Ht 5\' 5"  (1.651 m)  Wt 239 lb 4 oz (108.523 kg)  BMI 39.81 kg/m2  SpO2 98% Physical  Exam  Constitutional: She is oriented to person, place, and time. She appears well-developed and well-nourished. No distress.  HENT:  Head: Normocephalic and atraumatic.  Right Ear: External ear normal.  Left Ear: External ear normal.  Nose: Nose normal.  Mouth/Throat: Oropharynx is clear and moist. No oropharyngeal exudate.  Eyes: Conjunctivae are normal. Pupils are equal, round, and reactive to light. Right eye exhibits no discharge. Left eye exhibits no discharge. No scleral icterus.  Neck: Normal range of motion. Neck supple. No tracheal deviation present. No thyromegaly present.  Cardiovascular: Normal rate, regular  rhythm, normal heart sounds and intact distal pulses.  Exam reveals no gallop and no friction rub.   No murmur heard. Pulmonary/Chest: Effort normal and breath sounds normal. No respiratory distress. She has no wheezes. She has no rales. She exhibits no tenderness.  Musculoskeletal: Normal range of motion. She exhibits no edema or tenderness.  Lymphadenopathy:    She has no cervical adenopathy.  Neurological: She is alert and oriented to person, place, and time. No cranial nerve deficit. She exhibits normal muscle tone. Coordination normal.  Skin: Skin is warm and dry. No rash noted. She is not diaphoretic. No erythema. No pallor.  Psychiatric: She has a normal mood and affect. Her behavior is normal. Judgment and thought content normal.          Assessment & Plan:   Problem List Items Addressed This Visit      Unprioritized   Hypertension - Primary    BP Readings from Last 3 Encounters:  11/17/15 122/74  10/22/15 130/70  10/01/15 119/75   BP well controlled. Renal function with labs. Continue HCTZ.      Relevant Medications   hydrochlorothiazide (HYDRODIURIL) 25 MG tablet   Other Relevant Orders   Comprehensive metabolic panel   Menopausal symptoms    Symptoms consistent with menopause. Discussed some options including starting SSRI. Will start Wellbutrin daily. Follow up in 4 weeks and as needed.      Relevant Medications   buPROPion (WELLBUTRIN XL) 150 MG 24 hr tablet   Obesity    Wt Readings from Last 3 Encounters:  11/17/15 239 lb 4 oz (108.523 kg)  10/22/15 238 lb (107.956 kg)  10/01/15 235 lb (106.595 kg)   Encouraged healthy diet and exercise.      Varicose veins    Reviewed notes from Dr. Jamal Collin. Discussed pros and cons of vein ablation. Encouraged her to wear compression hose. Follow up with Dr. Jamal Collin as scheduled.      Relevant Medications   hydrochlorothiazide (HYDRODIURIL) 25 MG tablet       Return in about 4 weeks (around 12/15/2015) for  Recheck.

## 2015-12-22 ENCOUNTER — Ambulatory Visit (INDEPENDENT_AMBULATORY_CARE_PROVIDER_SITE_OTHER): Payer: BLUE CROSS/BLUE SHIELD | Admitting: Internal Medicine

## 2015-12-22 ENCOUNTER — Encounter: Payer: Self-pay | Admitting: Internal Medicine

## 2015-12-22 VITALS — BP 108/69 | HR 96 | Temp 98.1°F | Ht 65.0 in | Wt 242.2 lb

## 2015-12-22 DIAGNOSIS — N951 Menopausal and female climacteric states: Secondary | ICD-10-CM

## 2015-12-22 DIAGNOSIS — R0981 Nasal congestion: Secondary | ICD-10-CM | POA: Diagnosis not present

## 2015-12-22 MED ORDER — BUPROPION HCL ER (XL) 150 MG PO TB24: 150.0000 mg | ORAL_TABLET | Freq: Every day | ORAL | Status: DC

## 2015-12-22 NOTE — Progress Notes (Signed)
Subjective:    Patient ID: [name], female    DOB: [birth year], 45 y.o.   MRN: [id number]  HPI  45YO female presents for follow up.  Last seen in November. Started on Wellbutrin to help with menopausal symptoms.  Notes less irritability and mood swings on Wellbutrin. No negative side effects.  Sinus congestion - Over the last week, has had some right facial sinus pressure. Adding Flonase and Sudafed with some improvement. No fever, chills. No cough. Some diffuse headache with this.  Wt Readings from Last 3 Encounters:  12/22/15 242 lb 4 oz (109.884 kg)  11/17/15 239 lb 4 oz (108.523 kg)  10/22/15 238 lb (107.956 kg)   BP Readings from Last 3 Encounters:  12/22/15 108/69  11/17/15 122/74  10/22/15 130/70    Past Medical History  Diagnosis Date  . Thyroid disease   . Blood in urine 2014  . Breast mass 2013  . BV (bacterial vaginosis)   . Hypertension   . Edema     legs   Family History  Problem Relation Age of Onset  . Arthritis Mother   . Diabetes Father   . Hypertension Father   . Polycystic ovary syndrome Sister   . Cancer Maternal Grandmother     breast  . Breast cancer Neg Hx   . Ovarian cancer Neg Hx    Past Surgical History  Procedure Laterality Date  . Cesarean section  2007  . Dilation and curettage of uterus    . Roux-en-y procedure  03/04/2014    Dr. [place]  . Breast biopsy Right 2013    benign - fatty tissue  . Breast biopsy Left 06-12-13    fibroadenoma /core biopsy  . Breast biopsy Right 06-17-13    fibrocystic Herbie Drape biopsy   Social History   Social History  . Marital Status: Married    Spouse Name: N/A  . Number of Children: N/A  . Years of Education: N/A   Social History Main Topics  . Smoking status: Former Research scientist (life sciences)  . Smokeless tobacco: Never Used     Comment: quit 2006  . Alcohol Use: Yes     Comment: rare  . Drug Use: No  . Sexual Activity: Yes    Birth Control/ Protection: None   Other Topics Concern  . None    Social History Narrative    Review of Systems  Constitutional: Negative for fever, chills, appetite change, fatigue and unexpected weight change.  HENT: Positive for congestion, ear pain and sinus pressure. Negative for ear discharge, facial swelling, hearing loss, mouth sores, nosebleeds, postnasal drip, rhinorrhea, sneezing, sore throat, tinnitus, trouble swallowing and voice change.   Eyes: Negative for pain, discharge, redness and visual disturbance.  Respiratory: Negative for cough, chest tightness, shortness of breath, wheezing and stridor.   Cardiovascular: Negative for chest pain, palpitations and leg swelling.  Gastrointestinal: Negative for abdominal pain.  Musculoskeletal: Negative for myalgias, arthralgias, neck pain and neck stiffness.  Skin: Negative for color change and rash.  Neurological: Negative for dizziness, weakness, light-headedness and headaches.  Hematological: Negative for adenopathy. Does not bruise/bleed easily.  Psychiatric/Behavioral: Negative for suicidal ideas, sleep disturbance and dysphoric mood. The patient is not nervous/anxious.        Objective:    BP 108/69 mmHg  Pulse 96  Temp(Src) 98.1 F (36.7 C) (Oral)  Ht 5\' 5"  (1.651 m)  Wt 242 lb 4 oz (109.884 kg)  BMI 40.31 kg/m2  SpO2 97% Physical Exam  Constitutional: She  is oriented to person, place, and time. She appears well-developed and well-nourished. No distress.  HENT:  Head: Normocephalic and atraumatic.  Right Ear: External ear normal. Tympanic membrane is not erythematous and not bulging. A middle ear effusion is present.  Left Ear: External ear normal. Tympanic membrane is not erythematous and not bulging. A middle ear effusion is present.  Nose: Right sinus exhibits maxillary sinus tenderness. Right sinus exhibits no frontal sinus tenderness.  Mouth/Throat: Oropharynx is clear and moist. No oropharyngeal exudate.  Eyes: Conjunctivae are normal. Pupils are equal, round, and reactive  to light. Right eye exhibits no discharge. Left eye exhibits no discharge. No scleral icterus.  Neck: Normal range of motion. Neck supple. No tracheal deviation present. No thyromegaly present.  Cardiovascular: Normal rate, regular rhythm, normal heart sounds and intact distal pulses.  Exam reveals no gallop and no friction rub.   No murmur heard. Pulmonary/Chest: Effort normal and breath sounds normal. No respiratory distress. She has no wheezes. She has no rales. She exhibits no tenderness.  Musculoskeletal: Normal range of motion. She exhibits no edema or tenderness.  Lymphadenopathy:    She has no cervical adenopathy.  Neurological: She is alert and oriented to person, place, and time. No cranial nerve deficit. She exhibits normal muscle tone. Coordination normal.  Skin: Skin is warm and dry. No rash noted. She is not diaphoretic. No erythema. No pallor.  Psychiatric: She has a normal mood and affect. Her behavior is normal. Judgment and thought content normal.          Assessment & Plan:   Problem List Items Addressed This Visit      Unprioritized   Menopausal symptoms - Primary    Symptoms improved with Wellbutrin. Will continue.      Relevant Medications   buPROPion (WELLBUTRIN XL) 150 MG 24 hr tablet   Sinus congestion    Several days of sinus congestion. Discussed continued supportive care. If symptoms persistent, then would favor treating for right maxillary sinusitis. She will call if symptoms persistent this week. In interim continue Sudafed, Flonase.          Return in about 6 months (around 06/20/2016) for Recheck.

## 2015-12-22 NOTE — Progress Notes (Signed)
Pre visit review using our clinic review tool, if applicable. No additional management support is needed unless otherwise documented below in the visit note. 

## 2015-12-22 NOTE — Assessment & Plan Note (Signed)
Several days of sinus congestion. Discussed continued supportive care. If symptoms persistent, then would favor treating for right maxillary sinusitis. She will call if symptoms persistent this week. In interim continue Sudafed, Flonase.

## 2015-12-22 NOTE — Assessment & Plan Note (Signed)
Symptoms improved with Wellbutrin. Will continue. 

## 2015-12-22 NOTE — Patient Instructions (Signed)
Call if sinus congestion is not improving this week.  Follow up 6 months and as needed.

## 2016-03-09 ENCOUNTER — Encounter: Payer: Self-pay | Admitting: *Deleted

## 2016-05-10 DIAGNOSIS — J0100 Acute maxillary sinusitis, unspecified: Secondary | ICD-10-CM | POA: Diagnosis not present

## 2016-05-26 ENCOUNTER — Encounter: Payer: Self-pay | Admitting: Internal Medicine

## 2016-05-26 ENCOUNTER — Ambulatory Visit (INDEPENDENT_AMBULATORY_CARE_PROVIDER_SITE_OTHER): Payer: BLUE CROSS/BLUE SHIELD | Admitting: Internal Medicine

## 2016-05-26 VITALS — BP 120/70 | HR 68 | Temp 98.6°F | Ht 65.0 in | Wt 249.0 lb

## 2016-05-26 DIAGNOSIS — Z0000 Encounter for general adult medical examination without abnormal findings: Secondary | ICD-10-CM | POA: Diagnosis not present

## 2016-05-26 LAB — COMPREHENSIVE METABOLIC PANEL
ALT: 12 U/L (ref 0–35)
AST: 15 U/L (ref 0–37)
Albumin: 4 g/dL (ref 3.5–5.2)
Alkaline Phosphatase: 91 U/L (ref 39–117)
BUN: 18 mg/dL (ref 6–23)
CO2: 29 mEq/L (ref 19–32)
Calcium: 9.1 mg/dL (ref 8.4–10.5)
Chloride: 105 mEq/L (ref 96–112)
Creatinine, Ser: 0.64 mg/dL (ref 0.40–1.20)
GFR: 106.61 mL/min (ref >60.00)
Glucose, Bld: 86 mg/dL (ref 70–99)
Potassium: 3.6 mEq/L (ref 3.5–5.1)
Sodium: 140 mEq/L (ref 135–145)
Total Bilirubin: 0.6 mg/dL (ref 0.2–1.2)
Total Protein: 6.4 g/dL (ref 6.0–8.3)

## 2016-05-26 LAB — MICROALBUMIN / CREATININE URINE RATIO
Creatinine,U: 65.2 mg/dL
Microalb Creat Ratio: 1.1 mg/g (ref 0.0–30.0)
Microalb, Ur: <0.7 mg/dL (ref 0.0–1.9)

## 2016-05-26 LAB — CBC WITH DIFFERENTIAL/PLATELET
Basophils Absolute: 0.1 10*3/uL (ref 0.0–0.1)
Basophils Relative: 0.7 % (ref 0.0–3.0)
Eosinophils Absolute: 0.3 10*3/uL (ref 0.0–0.7)
Eosinophils Relative: 2.9 % (ref 0.0–5.0)
HCT: 39.3 % (ref 36.0–46.0)
Hemoglobin: 13.2 g/dL (ref 12.0–15.0)
Lymphocytes Relative: 24.3 % (ref 12.0–46.0)
Lymphs Abs: 2.2 10*3/uL (ref 0.7–4.0)
MCHC: 33.6 g/dL (ref 30.0–36.0)
MCV: 87.1 fl (ref 78.0–100.0)
Monocytes Absolute: 0.6 10*3/uL (ref 0.1–1.0)
Monocytes Relative: 6.2 % (ref 3.0–12.0)
Neutro Abs: 6 10*3/uL (ref 1.4–7.7)
Neutrophils Relative %: 65.9 % (ref 43.0–77.0)
Platelets: 262 10*3/uL (ref 150.0–400.0)
RBC: 4.51 Mil/uL (ref 3.87–5.11)
RDW: 13.2 % (ref 11.5–15.5)
WBC: 9.1 10*3/uL (ref 4.0–10.5)

## 2016-05-26 LAB — TSH: TSH: 0.65 u[IU]/mL (ref 0.35–4.50)

## 2016-05-26 LAB — LIPID PANEL
Cholesterol: 145 mg/dL (ref 0–200)
HDL: 47.3 mg/dL (ref >39.00)
LDL Cholesterol: 76 mg/dL (ref 0–99)
NonHDL: 98.04
Total CHOL/HDL Ratio: 3
Triglycerides: 110 mg/dL (ref 0.0–149.0)
VLDL: 22 mg/dL (ref 0.0–40.0)

## 2016-05-26 LAB — VITAMIN D 25 HYDROXY (VIT D DEFICIENCY, FRACTURES): VITD: 31.84 ng/mL (ref 30.00–100.00)

## 2016-05-26 LAB — HEMOGLOBIN A1C: Hgb A1c MFr Bld: 4.9 % (ref 4.6–6.5)

## 2016-05-26 MED ORDER — SCOPOLAMINE 1 MG/3DAYS TD PT72: 1.0000 | MEDICATED_PATCH | TRANSDERMAL | Status: DC

## 2016-05-26 NOTE — Addendum Note (Signed)
Addended by: Frutoso Chase A on: 05/26/2016 09:06 AM   Modules accepted: Miquel Dunn

## 2016-05-26 NOTE — Patient Instructions (Signed)
Health Maintenance, Female Adopting a healthy lifestyle and getting preventive care can go a long way to promote health and wellness. Talk with your health care provider about what schedule of regular examinations is right for you. This is a good chance for you to check in with your provider about disease prevention and staying healthy. In between checkups, there are plenty of things you can do on your own. Experts have done a lot of research about which lifestyle changes and preventive measures are most likely to keep you healthy. Ask your health care provider for more information. WEIGHT AND DIET  Eat a healthy diet  Be sure to include plenty of vegetables, fruits, low-fat dairy products, and lean protein.  Do not eat a lot of foods high in solid fats, added sugars, or salt.  Get regular exercise. This is one of the most important things you can do for your health.  Most adults should exercise for at least 150 minutes each week. The exercise should increase your heart rate and make you sweat (moderate-intensity exercise).  Most adults should also do strengthening exercises at least twice a week. This is in addition to the moderate-intensity exercise.  Maintain a healthy weight  Body mass index (BMI) is a measurement that can be used to identify possible weight problems. It estimates body fat based on height and weight. Your health care provider can help determine your BMI and help you achieve or maintain a healthy weight.  For females 20 years of age and older:   A BMI below 18.5 is considered underweight.  A BMI of 18.5 to 24.9 is normal.  A BMI of 25 to 29.9 is considered overweight.  A BMI of 30 and above is considered obese.  Watch levels of cholesterol and blood lipids  You should start having your blood tested for lipids and cholesterol at 45 years of age, then have this test every 5 years.  You may need to have your cholesterol levels checked more often if:  Your lipid  or cholesterol levels are high.  You are older than 45 years of age.  You are at high risk for heart disease.  CANCER SCREENING   Lung Cancer  Lung cancer screening is recommended for adults 55-80 years old who are at high risk for lung cancer because of a history of smoking.  A yearly low-dose CT scan of the lungs is recommended for people who:  Currently smoke.  Have quit within the past 15 years.  Have at least a 30-pack-year history of smoking. A pack year is smoking an average of one pack of cigarettes a day for 1 year.  Yearly screening should continue until it has been 15 years since you quit.  Yearly screening should stop if you develop a health problem that would prevent you from having lung cancer treatment.  Breast Cancer  Practice breast self-awareness. This means understanding how your breasts normally appear and feel.  It also means doing regular breast self-exams. Let your health care provider know about any changes, no matter how small.  If you are in your 20s or 30s, you should have a clinical breast exam (CBE) by a health care provider every 1-3 years as part of a regular health exam.  If you are 40 or older, have a CBE every year. Also consider having a breast X-ray (mammogram) every year.  If you have a family history of breast cancer, talk to your health care provider about genetic screening.  If you   are at high risk for breast cancer, talk to your health care provider about having an MRI and a mammogram every year.  Breast cancer gene (BRCA) assessment is recommended for women who have family members with BRCA-related cancers. BRCA-related cancers include:  Breast.  Ovarian.  Tubal.  Peritoneal cancers.  Results of the assessment will determine the need for genetic counseling and BRCA1 and BRCA2 testing. Cervical Cancer Your health care provider may recommend that you be screened regularly for cancer of the pelvic organs (ovaries, uterus, and  vagina). This screening involves a pelvic examination, including checking for microscopic changes to the surface of your cervix (Pap test). You may be encouraged to have this screening done every 3 years, beginning at age 21.  For women ages 30-65, health care providers may recommend pelvic exams and Pap testing every 3 years, or they may recommend the Pap and pelvic exam, combined with testing for human papilloma virus (HPV), every 5 years. Some types of HPV increase your risk of cervical cancer. Testing for HPV may also be done on women of any age with unclear Pap test results.  Other health care providers may not recommend any screening for nonpregnant women who are considered low risk for pelvic cancer and who do not have symptoms. Ask your health care provider if a screening pelvic exam is right for you.  If you have had past treatment for cervical cancer or a condition that could lead to cancer, you need Pap tests and screening for cancer for at least 20 years after your treatment. If Pap tests have been discontinued, your risk factors (such as having a new sexual partner) need to be reassessed to determine if screening should resume. Some women have medical problems that increase the chance of getting cervical cancer. In these cases, your health care provider may recommend more frequent screening and Pap tests. Colorectal Cancer  This type of cancer can be detected and often prevented.  Routine colorectal cancer screening usually begins at 45 years of age and continues through 45 years of age.  Your health care provider may recommend screening at an earlier age if you have risk factors for colon cancer.  Your health care provider may also recommend using home test kits to check for hidden blood in the stool.  A small camera at the end of a tube can be used to examine your colon directly (sigmoidoscopy or colonoscopy). This is done to check for the earliest forms of colorectal  cancer.  Routine screening usually begins at age 50.  Direct examination of the colon should be repeated every 5-10 years through 45 years of age. However, you may need to be screened more often if early forms of precancerous polyps or small growths are found. Skin Cancer  Check your skin from head to toe regularly.  Tell your health care provider about any new moles or changes in moles, especially if there is a change in a mole's shape or color.  Also tell your health care provider if you have a mole that is larger than the size of a pencil eraser.  Always use sunscreen. Apply sunscreen liberally and repeatedly throughout the day.  Protect yourself by wearing long sleeves, pants, a wide-brimmed hat, and sunglasses whenever you are outside. HEART DISEASE, DIABETES, AND HIGH BLOOD PRESSURE   High blood pressure causes heart disease and increases the risk of stroke. High blood pressure is more likely to develop in:  People who have blood pressure in the high end   of the normal range (130-139/85-89 mm Hg).  People who are overweight or obese.  People who are African American.  If you are 38-23 years of age, have your blood pressure checked every 3-5 years. If you are 61 years of age or older, have your blood pressure checked every year. You should have your blood pressure measured twice--once when you are at a hospital or clinic, and once when you are not at a hospital or clinic. Record the average of the two measurements. To check your blood pressure when you are not at a hospital or clinic, you can use:  An automated blood pressure machine at a pharmacy.  A home blood pressure monitor.  If you are between 45 years and 39 years old, ask your health care provider if you should take aspirin to prevent strokes.  Have regular diabetes screenings. This involves taking a blood sample to check your fasting blood sugar level.  If you are at a normal weight and have a low risk for diabetes,  have this test once every three years after 45 years of age.  If you are overweight and have a high risk for diabetes, consider being tested at a younger age or more often. PREVENTING INFECTION  Hepatitis B  If you have a higher risk for hepatitis B, you should be screened for this virus. You are considered at high risk for hepatitis B if:  You were born in a country where hepatitis B is common. Ask your health care provider which countries are considered high risk.  Your parents were born in a high-risk country, and you have not been immunized against hepatitis B (hepatitis B vaccine).  You have HIV or AIDS.  You use needles to inject street drugs.  You live with someone who has hepatitis B.  You have had sex with someone who has hepatitis B.  You get hemodialysis treatment.  You take certain medicines for conditions, including cancer, organ transplantation, and autoimmune conditions. Hepatitis C  Blood testing is recommended for:  Everyone born from [birth year] through 1965.  Anyone with known risk factors for hepatitis C. Sexually transmitted infections (STIs)  You should be screened for sexually transmitted infections (STIs) including gonorrhea and chlamydia if:  You are sexually active and are younger than 45 years of age.  You are older than 45 years of age and your health care provider tells you that you are at risk for this type of infection.  Your sexual activity has changed since you were last screened and you are at an increased risk for chlamydia or gonorrhea. Ask your health care provider if you are at risk.  If you do not have HIV, but are at risk, it may be recommended that you take a prescription medicine daily to prevent HIV infection. This is called pre-exposure prophylaxis (PrEP). You are considered at risk if:  You are sexually active and do not regularly use condoms or know the HIV status of your partner(s).  You take drugs by injection.  You are sexually  active with a partner who has HIV. Talk with your health care provider about whether you are at high risk of being infected with HIV. If you choose to begin PrEP, you should first be tested for HIV. You should then be tested every 3 months for as long as you are taking PrEP.  PREGNANCY   If you are premenopausal and you may become pregnant, ask your health care provider about preconception counseling.  If you may  become pregnant, take 400 to 800 micrograms (mcg) of folic acid every day.  If you want to prevent pregnancy, talk to your health care provider about birth control (contraception). OSTEOPOROSIS AND MENOPAUSE   Osteoporosis is a disease in which the bones lose minerals and strength with aging. This can result in serious bone fractures. Your risk for osteoporosis can be identified using a bone density scan.  If you are 61 years of age or older, or if you are at risk for osteoporosis and fractures, ask your health care provider if you should be screened.  Ask your health care provider whether you should take a calcium or vitamin D supplement to lower your risk for osteoporosis.  Menopause may have certain physical symptoms and risks.  Hormone replacement therapy may reduce some of these symptoms and risks. Talk to your health care provider about whether hormone replacement therapy is right for you.  HOME CARE INSTRUCTIONS   Schedule regular health, dental, and eye exams.  Stay current with your immunizations.   Do not use any tobacco products including cigarettes, chewing tobacco, or electronic cigarettes.  If you are pregnant, do not drink alcohol.  If you are breastfeeding, limit how much and how often you drink alcohol.  Limit alcohol intake to no more than 1 drink per day for nonpregnant women. One drink equals 12 ounces of beer, 5 ounces of wine, or 1 ounces of hard liquor.  Do not use street drugs.  Do not share needles.  Ask your health care provider for help if  you need support or information about quitting drugs.  Tell your health care provider if you often feel depressed.  Tell your health care provider if you have ever been abused or do not feel safe at home.   This information is not intended to replace advice given to you by your health care provider. Make sure you discuss any questions you have with your health care provider.   Document Released: 06/20/2011 Document Revised: 12/26/2014 Document Reviewed: 11/06/2013 Elsevier Interactive Patient Education [employer].

## 2016-05-26 NOTE — Assessment & Plan Note (Signed)
General medical exam normal today including breast exam. PAP and pelvic deferred as PAP normal, HPV neg in 2015, plan repeat in 2018. Mammogram scheduled. Immunizations UTD. Labs today. Encouraged healthy diet and exercise.

## 2016-05-26 NOTE — Progress Notes (Signed)
Subjective:    Patient ID: [name], female    DOB: 1971/02/23, 45 y.o.   MRN: [id number]  HPI  45YO female presents for physical exam.  Generally feeling well. Occasional leg pain at night. Not taking anything for this. Planning for cruise in summer.  Wt Readings from Last 3 Encounters:  05/26/16 249 lb (112.946 kg)  12/22/15 242 lb 4 oz (109.884 kg)  11/17/15 239 lb 4 oz (108.523 kg)   BP Readings from Last 3 Encounters:  05/26/16 120/70  12/22/15 108/69  11/17/15 122/74    Past Medical History  Diagnosis Date  . Thyroid disease   . Blood in urine 2014  . Breast mass 2013  . BV (bacterial vaginosis)   . Hypertension   . Edema     legs   Family History  Problem Relation Age of Onset  . Arthritis Mother   . Diabetes Father   . Hypertension Father   . Polycystic ovary syndrome Sister   . Cancer Maternal Grandmother     breast  . Breast cancer Neg Hx   . Ovarian cancer Neg Hx    Past Surgical History  Procedure Laterality Date  . Cesarean section  2007  . Dilation and curettage of uterus    . Roux-en-y procedure  03/04/2014    Dr. Saint Lucia  . Breast biopsy Right 2013    benign - fatty tissue  . Breast biopsy Left 06-12-13    fibroadenoma /core biopsy  . Breast biopsy Right 06-17-13    fibrocystic Herbie Drape biopsy   Social History   Social History  . Marital Status: Married    Spouse Name: N/A  . Number of Children: N/A  . Years of Education: N/A   Social History Main Topics  . Smoking status: Former Research scientist (life sciences)  . Smokeless tobacco: Never Used     Comment: quit 2006  . Alcohol Use: Yes     Comment: rare  . Drug Use: No  . Sexual Activity: Yes    Birth Control/ Protection: None   Other Topics Concern  . None   Social History Narrative    Review of Systems  Constitutional: Negative for fever, chills, appetite change, fatigue and unexpected weight change.  Eyes: Negative for visual disturbance.  Respiratory: Negative for shortness of breath.     Cardiovascular: Negative for chest pain and leg swelling.  Gastrointestinal: Negative for abdominal pain, diarrhea and constipation.  Genitourinary: Negative for vaginal bleeding and vaginal discharge.  Musculoskeletal: Positive for myalgias. Negative for arthralgias.  Skin: Negative for color change and rash.  Hematological: Negative for adenopathy. Does not bruise/bleed easily.  Psychiatric/Behavioral: Negative for suicidal ideas, sleep disturbance and dysphoric mood. The patient is not nervous/anxious.        Objective:    BP 120/70 mmHg  Pulse 68  Temp(Src) 98.6 F (37 C)  Ht 5\' 5"  (1.651 m)  Wt 249 lb (112.946 kg)  BMI 41.44 kg/m2  SpO2 97%  LMP 05/03/2016 Physical Exam  Constitutional: She is oriented to person, place, and time. She appears well-developed and well-nourished. No distress.  HENT:  Head: Normocephalic and atraumatic.  Right Ear: External ear normal.  Left Ear: External ear normal.  Nose: Nose normal.  Mouth/Throat: Oropharynx is clear and moist. No oropharyngeal exudate.  Eyes: Conjunctivae are normal. Pupils are equal, round, and reactive to light. Right eye exhibits no discharge. Left eye exhibits no discharge. No scleral icterus.  Neck: Normal range of motion. Neck supple. No tracheal  deviation present. No thyromegaly present.  Cardiovascular: Normal rate, regular rhythm, normal heart sounds and intact distal pulses.  Exam reveals no gallop and no friction rub.   No murmur heard. Pulmonary/Chest: Effort normal and breath sounds normal. No accessory muscle usage. No tachypnea. No respiratory distress. She has no decreased breath sounds. She has no wheezes. She has no rales. She exhibits no tenderness. Right breast exhibits no inverted nipple, no mass, no nipple discharge, no skin change and no tenderness. Left breast exhibits no inverted nipple, no mass, no nipple discharge, no skin change and no tenderness. Breasts are symmetrical.  Abdominal: Soft. Bowel  sounds are normal. She exhibits no distension and no mass. There is no tenderness. There is no rebound and no guarding.  Musculoskeletal: Normal range of motion. She exhibits no edema or tenderness.  Lymphadenopathy:    She has no cervical adenopathy.  Neurological: She is alert and oriented to person, place, and time. No cranial nerve deficit. She exhibits normal muscle tone. Coordination normal.  Skin: Skin is warm and dry. No rash noted. She is not diaphoretic. No erythema. No pallor.  Psychiatric: She has a normal mood and affect. Her behavior is normal. Judgment and thought content normal.          Assessment & Plan:   Problem List Items Addressed This Visit      Unprioritized   Routine general medical examination at a health care facility - Primary    General medical exam normal today including breast exam. PAP and pelvic deferred as PAP normal, HPV neg in 2015, plan repeat in 2018. Mammogram scheduled. Immunizations UTD. Labs today. Encouraged healthy diet and exercise.      Relevant Orders   CBC with Differential/Platelet   Comprehensive metabolic panel   Lipid panel   Hemoglobin A1c   TSH   Microalbumin / creatinine urine ratio   VITAMIN D 25 Hydroxy (Vit-D Deficiency, Fractures)       Return in about 6 months (around 11/25/2016) for Recheck.  Ronette Deter, MD Internal Medicine [facility]

## 2016-06-10 DIAGNOSIS — Z1231 Encounter for screening mammogram for malignant neoplasm of breast: Secondary | ICD-10-CM | POA: Diagnosis not present

## 2016-06-13 ENCOUNTER — Encounter: Payer: Self-pay | Admitting: General Surgery

## 2016-06-16 ENCOUNTER — Ambulatory Visit: Payer: BLUE CROSS/BLUE SHIELD | Admitting: General Surgery

## 2016-06-27 ENCOUNTER — Ambulatory Visit: Payer: BLUE CROSS/BLUE SHIELD | Admitting: Internal Medicine

## 2016-06-29 ENCOUNTER — Ambulatory Visit: Payer: BLUE CROSS/BLUE SHIELD | Admitting: General Surgery

## 2016-07-07 ENCOUNTER — Ambulatory Visit: Payer: BLUE CROSS/BLUE SHIELD | Admitting: General Surgery

## 2016-07-19 ENCOUNTER — Encounter: Payer: Self-pay | Admitting: *Deleted

## 2016-07-21 ENCOUNTER — Ambulatory Visit (INDEPENDENT_AMBULATORY_CARE_PROVIDER_SITE_OTHER): Payer: BLUE CROSS/BLUE SHIELD | Admitting: General Surgery

## 2016-07-21 ENCOUNTER — Other Ambulatory Visit: Payer: Self-pay

## 2016-07-21 ENCOUNTER — Encounter: Payer: Self-pay | Admitting: General Surgery

## 2016-07-21 VITALS — BP 132/72 | HR 86 | Resp 16 | Ht 65.0 in | Wt 249.0 lb

## 2016-07-21 DIAGNOSIS — N6012 Diffuse cystic mastopathy of left breast: Secondary | ICD-10-CM | POA: Diagnosis not present

## 2016-07-21 NOTE — Progress Notes (Deleted)
Patient ID: [name], female   DOB: 1971-02-11, 45 y.o.   MRN: [id number]  Chief Complaint  Patient presents with  . Follow-up    mammogram    HPI [name] is a 45 y.o. female who presents for a breast evaluation. The most recent mammogram was done on 06/09/16.  Patient does perform regular self breast checks and gets regular mammograms done.    HPI  Past Medical History:  Diagnosis Date  . Blood in urine 2014  . Breast mass 2013  . BV (bacterial vaginosis)   . Edema    legs  . Hypertension   . Thyroid disease     Past Surgical History:  Procedure Laterality Date  . BREAST BIOPSY Right 2013   benign - fatty tissue  . BREAST BIOPSY Left 06-12-13   fibroadenoma /core biopsy  . BREAST BIOPSY Right 06-17-13   fibrocystic /stereo biopsy  . CESAREAN SECTION  2007  . DILATION AND CURETTAGE OF UTERUS    . ROUX-EN-Y PROCEDURE  03/04/2014   Dr. Saint Lucia    Family History  Problem Relation Age of Onset  . Arthritis Mother   . Diabetes Father   . Hypertension Father   . Polycystic ovary syndrome Sister   . Cancer Maternal Grandmother     breast  . Breast cancer Neg Hx   . Ovarian cancer Neg Hx     Social History Social History  Substance Use Topics  . Smoking status: Former Research scientist (life sciences)  . Smokeless tobacco: Never Used     Comment: quit 2006  . Alcohol use Yes     Comment: rare    Allergies  Allergen Reactions  . Avelox [Moxifloxacin Hcl In Nacl] Palpitations    Current Outpatient Prescriptions  Medication Sig Dispense Refill  . hydrochlorothiazide (HYDRODIURIL) 25 MG tablet Take 1 tablet (25 mg total) by mouth daily. 90 tablet 3   No current facility-administered medications for this visit.     Review of Systems Review of Systems  Constitutional: Negative.   Respiratory: Negative.   Cardiovascular: Negative.     Blood pressure 132/72, pulse 86, resp. rate 16, height 5\' 5"  (1.651 m), weight 249 lb (112.9 kg).  Physical Exam Physical Exam   Constitutional: She is oriented to person, place, and time. She appears well-developed and well-nourished.  Eyes: Conjunctivae are normal. No scleral icterus.  Neck: Neck supple.  Cardiovascular: Normal rate, regular rhythm and normal heart sounds.   Pulmonary/Chest: Effort normal and breath sounds normal.  Lymphadenopathy:    She has no cervical adenopathy.  Neurological: She is oriented to person, place, and time.  Skin: Skin is warm.    Data Reviewed Mammogram reviewed   Assessment        Plan     The patient has been asked to return to the office in one year with a bilateral screening mammogram.  This information has been scribed by Gaspar Cola CMA.   Gaspar Cola 07/21/2016, 9:25 AM

## 2016-07-21 NOTE — Patient Instructions (Signed)
The patient has been asked to return to the office in one year with a bilateral screening mammogram. 

## 2016-07-21 NOTE — Progress Notes (Signed)
Patient ID: [name], female   DOB: 07/15/1971, 45 y.o.   MRN: [id number]  Chief Complaint  Patient presents with  . Follow-up    mammogram    HPI [name] is a 45 y.o. female who presents for her yearly breast evaluation. She has had two biopsies in the past - both benign findings. The most recent mammogram was done on 06/09/16.  Patient does perform regular self breast checks and gets regular mammograms done.  She also has a history of venous insufficiency and varicose veins. She does not wear her compression hose as instructed. No new complaints with her breasts or venous problems. I have reviewed the history of present illness with the patient.  HPI  Past Medical History:  Diagnosis Date  . Blood in urine 2014  . Breast mass 2013  . BV (bacterial vaginosis)   . Edema    legs  . Hypertension   . Thyroid disease     Past Surgical History:  Procedure Laterality Date  . BREAST BIOPSY Right 2013   benign - fatty tissue  . BREAST BIOPSY Left 06-12-13   fibroadenoma /core biopsy  . BREAST BIOPSY Right 06-17-13   fibrocystic /stereo biopsy  . CESAREAN SECTION  2007  . DILATION AND CURETTAGE OF UTERUS    . ROUX-EN-Y PROCEDURE  03/04/2014   Dr. Saint Lucia    Family History  Problem Relation Age of Onset  . Arthritis Mother   . Diabetes Father   . Hypertension Father   . Polycystic ovary syndrome Sister   . Cancer Maternal Grandmother     breast  . Breast cancer Neg Hx   . Ovarian cancer Neg Hx     Social History Social History  Substance Use Topics  . Smoking status: Former Research scientist (life sciences)  . Smokeless tobacco: Never Used     Comment: quit 2006  . Alcohol use Yes     Comment: rare    Allergies  Allergen Reactions  . Avelox [Moxifloxacin Hcl In Nacl] Palpitations    Current Outpatient Prescriptions  Medication Sig Dispense Refill  . hydrochlorothiazide (HYDRODIURIL) 25 MG tablet Take 1 tablet (25 mg total) by mouth daily. 90 tablet 3   No current  facility-administered medications for this visit.     Review of Systems Review of Systems  Constitutional: Negative.   Respiratory: Negative.   Cardiovascular: Negative.     Blood pressure 132/72, pulse 86, resp. rate 16, height 5\' 5"  (1.651 m), weight 249 lb (112.9 kg).  Physical Exam Physical Exam  Constitutional: She is oriented to person, place, and time. She appears well-developed and well-nourished.  Eyes: Conjunctivae are normal.  Neck: Neck supple.  Cardiovascular: Normal rate, regular rhythm and normal heart sounds.   Pulses:      Dorsalis pedis pulses are 2+ on the right side, and 2+ on the left side.       Posterior tibial pulses are 2+ on the right side, and 2+ on the left side.  Varicose veins unchanged from last visit: left thigh, left calf and right knee. Scant edema in the lower third of the leg. No skin changes.  Pulmonary/Chest: Effort normal and breath sounds normal. Right breast exhibits no inverted nipple, no mass, no nipple discharge, no skin change and no tenderness. Left breast exhibits mass. Left breast exhibits no inverted nipple, no skin change and no tenderness.    Abdominal: Soft. Bowel sounds are normal.  Lymphadenopathy:    She has no cervical adenopathy.  She has no axillary adenopathy.  Neurological: She is alert and oriented to person, place, and time.  Skin: Skin is warm and dry.    Data Reviewed Mammogram reviewed [place] left breast - thickened glandular tissue. benign findings.   Assessment    Fibrocystic breast disease. Fibroadenoma, left breast Varicose veins, minimally symptomatic.    Plan       Patient has been asked to call the office should leg or breast concerns arise. Patient will be asked to return to the office in one year with a bilateral screening mammogram.    This information has been scribed by Gaspar Cola CMA.  Harless Molinari G 07/21/2016, 10:35 AM

## 2016-08-18 ENCOUNTER — Telehealth: Payer: Self-pay | Admitting: Family

## 2016-08-18 ENCOUNTER — Other Ambulatory Visit (INDEPENDENT_AMBULATORY_CARE_PROVIDER_SITE_OTHER): Payer: BLUE CROSS/BLUE SHIELD

## 2016-08-18 ENCOUNTER — Encounter: Payer: Self-pay | Admitting: Family

## 2016-08-18 ENCOUNTER — Ambulatory Visit (INDEPENDENT_AMBULATORY_CARE_PROVIDER_SITE_OTHER): Payer: BLUE CROSS/BLUE SHIELD

## 2016-08-18 ENCOUNTER — Telehealth: Payer: Self-pay | Admitting: *Deleted

## 2016-08-18 ENCOUNTER — Ambulatory Visit (INDEPENDENT_AMBULATORY_CARE_PROVIDER_SITE_OTHER): Payer: BLUE CROSS/BLUE SHIELD | Admitting: Family

## 2016-08-18 VITALS — BP 136/80 | HR 85 | Temp 98.2°F | Ht 66.0 in | Wt 247.6 lb

## 2016-08-18 DIAGNOSIS — R202 Paresthesia of skin: Secondary | ICD-10-CM

## 2016-08-18 DIAGNOSIS — M546 Pain in thoracic spine: Secondary | ICD-10-CM

## 2016-08-18 DIAGNOSIS — M79651 Pain in right thigh: Secondary | ICD-10-CM | POA: Diagnosis not present

## 2016-08-18 DIAGNOSIS — M79652 Pain in left thigh: Secondary | ICD-10-CM

## 2016-08-18 DIAGNOSIS — R200 Anesthesia of skin: Secondary | ICD-10-CM

## 2016-08-18 DIAGNOSIS — M25562 Pain in left knee: Secondary | ICD-10-CM

## 2016-08-18 DIAGNOSIS — M791 Myalgia, unspecified site: Secondary | ICD-10-CM

## 2016-08-18 DIAGNOSIS — M25561 Pain in right knee: Secondary | ICD-10-CM

## 2016-08-18 DIAGNOSIS — M542 Cervicalgia: Secondary | ICD-10-CM | POA: Diagnosis not present

## 2016-08-18 DIAGNOSIS — M47814 Spondylosis without myelopathy or radiculopathy, thoracic region: Secondary | ICD-10-CM | POA: Diagnosis not present

## 2016-08-18 LAB — COMPREHENSIVE METABOLIC PANEL
ALT: 11 U/L (ref 0–35)
AST: 15 U/L (ref 0–37)
Albumin: 3.9 g/dL (ref 3.5–5.2)
Alkaline Phosphatase: 101 U/L (ref 39–117)
BUN: 14 mg/dL (ref 6–23)
CO2: 31 mEq/L (ref 19–32)
Calcium: 8.8 mg/dL (ref 8.4–10.5)
Chloride: 104 mEq/L (ref 96–112)
Creatinine, Ser: 0.69 mg/dL (ref 0.40–1.20)
GFR: 97.64 mL/min (ref >60.00)
Glucose, Bld: 84 mg/dL (ref 70–99)
Potassium: 4 mEq/L (ref 3.5–5.1)
Sodium: 140 mEq/L (ref 135–145)
Total Bilirubin: 0.5 mg/dL (ref 0.2–1.2)
Total Protein: 6.4 g/dL (ref 6.0–8.3)

## 2016-08-18 LAB — IBC PANEL
Iron: 83 ug/dL (ref 42–145)
Saturation Ratios: 20.2 % (ref 20.0–50.0)
Transferrin: 294 mg/dL (ref 212.0–360.0)

## 2016-08-18 LAB — FERRITIN: Ferritin: 19.1 ng/mL (ref 10.0–291.0)

## 2016-08-18 LAB — B12 AND FOLATE PANEL
Folate: 20.3 ng/mL (ref >5.9)
Vitamin B-12: 851 pg/mL (ref 211–911)

## 2016-08-18 NOTE — Telephone Encounter (Signed)
Patient has scheduled lab appointment for in the morning at 1045

## 2016-08-18 NOTE — Assessment & Plan Note (Signed)
Considering electrolyte or vitamin imbalance which may be contributing to bilateral thigh pain which appears to be worse at night and at rest. Low clinical suspicion of arterial disease such as intermittent claudication, as no pain with activity. Will continue to follow.

## 2016-08-18 NOTE — Telephone Encounter (Signed)
Perfect thanks so much!

## 2016-08-18 NOTE — Assessment & Plan Note (Addendum)
Considering muscle etiology. Pending x-ray films cervical and thoracic spine. Trial of capsaicin to help ease pain. Will consider MRI thoracic if pain doesn't improve to evaluate for nerve root impingement.Will continue to follow.

## 2016-08-18 NOTE — Telephone Encounter (Signed)
Please call patient and let her know that I added on one more lab as it was unable to be added on to her labs done today...  She can come back at her convenience after making a lab appointment that would be great. She will come back in the next week or this week.

## 2016-08-18 NOTE — Telephone Encounter (Signed)
Faxed over add-on form for Ferritin & IBC panel. Unable to add CBC because a lavender tube was not drawn this morning.

## 2016-08-18 NOTE — Progress Notes (Signed)
Pre visit review using our clinic review tool, if applicable. No additional management support is needed unless otherwise documented below in the visit note. 

## 2016-08-18 NOTE — Progress Notes (Signed)
Subjective:    Patient ID: [name], female    DOB: Mar 08, 1971, 45 y.o.   MRN: [id number]  CC: JAIMEE TYRER is a 45 y.o. female who presents today for an acute visit.    HPI: Patient has pain in upper right back pain for several months. Feels burning and 'feels muscle'. First started happening when she would wake up in morning. Pain is started to become more frequent. Notes that she has a numbness, tingling that wraps around right side. Has been using aspercreme and heating pad with relief.  No vision changes, severe HA, chest pain, SOB, cough. Normal BMs. No abdominal pain, dysuria, urinary frequency. No h/o renal stones. Pain is not associated not meals. No rash.   She also complains of bilateral thighs for past 2 weeks.  Pain only present when she is lying down. No pain over lateral hip/trochanter. Cannot sleep on either side due to thigh pain. Describes as ache. No pain with walking. No LE weakness, falls, injury. Follows with vascular surgeon and advised to get 'stents in groin'. No pain with movement. Doesn't drink much water. No numbness, tingling.   Believes she also has restless leg- during day and night describes lower parts of  legs as moving. Notes pain when sits in car for a long time.   On HCTZ for HTN.   H/o gastric bypass and cannot take NSAIDs.     Up-to-date Pap smear last done 2015, normal negative. Mammogram done 05/2016, Dr. Thurman Coyer. Normal. Prior had been on 6 month cycle, now moved to annually. Had biopsy done in both breasts in 2014, negative for malignancy.  Lab work done 2 months ago at [facility].   HISTORY:  Past Medical History:  Diagnosis Date  . Blood in urine 2014  . Breast mass 2013  . BV (bacterial vaginosis)   . Edema    legs  . Hypertension   . Thyroid disease    Past Surgical History:  Procedure Laterality Date  . BREAST BIOPSY Right 2013   benign - fatty tissue  . BREAST BIOPSY Left 06-12-13   fibroadenoma /core biopsy  . BREAST BIOPSY  Right 06-17-13   fibrocystic /stereo biopsy  . CESAREAN SECTION  2007  . DILATION AND CURETTAGE OF UTERUS    . ROUX-EN-Y PROCEDURE  03/04/2014   Dr. Saint Lucia   Family History  Problem Relation Age of Onset  . Arthritis Mother   . Diabetes Father   . Hypertension Father   . Polycystic ovary syndrome Sister   . Cancer Maternal Grandmother     breast  . Breast cancer Neg Hx   . Ovarian cancer Neg Hx     Allergies: Avelox [moxifloxacin hcl in nacl] Current Outpatient Prescriptions on File Prior to Visit  Medication Sig Dispense Refill  . hydrochlorothiazide (HYDRODIURIL) 25 MG tablet Take 1 tablet (25 mg total) by mouth daily. 90 tablet 3   No current facility-administered medications on file prior to visit.     Social History  Substance Use Topics  . Smoking status: Former Research scientist (life sciences)  . Smokeless tobacco: Never Used     Comment: quit 2006  . Alcohol use Yes     Comment: rare    Review of Systems  Constitutional: Negative for chills, fever and unexpected weight change.  HENT: Negative for congestion.   Respiratory: Negative for cough.   Cardiovascular: Negative for chest pain, palpitations and leg swelling.  Gastrointestinal: Negative for nausea and vomiting.  Genitourinary: Negative for dysuria,  pelvic pain and urgency.  Musculoskeletal: Positive for back pain and myalgias. Negative for arthralgias, neck pain and neck stiffness.  Skin: Negative for rash.  Neurological: Negative for tremors, weakness, light-headedness and headaches.  Hematological: Negative for adenopathy.  Psychiatric/Behavioral: Negative for confusion.      Objective:    BP 136/80   Pulse 85   Temp 98.2 F (36.8 C) (Oral)   Ht 5\' 6"  (1.676 m)   Wt 247 lb 9.6 oz (112.3 kg)   SpO2 97%   BMI 39.96 kg/m    Physical Exam  Constitutional: She appears well-developed and well-nourished.  Eyes: Conjunctivae are normal.  Cardiovascular: Normal rate, regular rhythm, normal heart sounds and normal pulses.     Pulmonary/Chest: Effort normal and breath sounds normal. She has no wheezes. She has no rhonchi. She has no rales.  Musculoskeletal:       Thoracic back: Normal. She exhibits normal range of motion, no tenderness, no bony tenderness, no pain and no spasm.       Back:       Right upper leg: She exhibits no tenderness, no bony tenderness and no swelling.       Left upper leg: She exhibits no tenderness, no bony tenderness and no swelling.  Unable to elicit any pain of bilateral thighs with palpation. No atrophy noted, muscles symmetric. Strength and sensation intact BLE.  Neurological: She is alert.  Skin: Skin is warm and dry.  Psychiatric: She has a normal mood and affect. Her speech is normal and behavior is normal. Thought content normal.  Vitals reviewed.      Assessment & Plan:   Problem List Items Addressed This Visit      Other   Bilateral thigh pain    Considering electrolyte or vitamin imbalance which may be contributing to bilateral thigh pain which appears to be worse at night and at rest. Low clinical suspicion of arterial disease such as intermittent claudication, as no pain with activity. Will continue to follow.       Thoracic back pain - Primary    Considering muscle etiology. Pending x-ray films cervical and thoracic spine. Trial of capsaicin to help ease pain. Will consider MRI thoracic if pain doesn't improve to evaluate for nerve root impingement.Will continue to follow.       Relevant Orders   DG Thoracic Spine 2 View   DG Cervical Spine 2 or 3 views    Other Visit Diagnoses    Numbness and tingling       Relevant Orders   B12 and Folate Panel   Comprehensive metabolic panel        I am having Ms. Holding maintain her hydrochlorothiazide.   No orders of the defined types were placed in this encounter.   Return precautions given.   Risks, benefits, and alternatives of the medications and treatment plan prescribed today were discussed, and patient  expressed understanding.   Education regarding symptom management and diagnosis given to patient on AVS.  Continue to follow with Mable Paris, FNP for routine health maintenance.   [name] and I agreed with plan.   Mable Paris, FNP

## 2016-08-18 NOTE — Patient Instructions (Signed)
My pleasure meeting you. As we discussed, your symptoms are a bit vague. We do some imaging of your back and I advise you to try over-the-counter capsaicin gel for the back pain. Gentle stretching and heat as I suspect it might be also a muscle spasm playing a role. Regarding thigh pain, pending lab work for vitamin and/or electrolyte abnormalities. We will notify you with all these results.  If there is no improvement in your symptoms, or if there is any worsening of symptoms, or if you have any additional concerns, please return for re-evaluation; or, if we are closed, consider going to the Emergency Room for evaluation if symptoms urgent.

## 2016-08-19 ENCOUNTER — Other Ambulatory Visit: Payer: BLUE CROSS/BLUE SHIELD

## 2016-08-19 DIAGNOSIS — Z23 Encounter for immunization: Secondary | ICD-10-CM | POA: Diagnosis not present

## 2016-08-23 NOTE — Telephone Encounter (Signed)
Not as of now, thanks they were completed on 8/31.

## 2016-08-23 NOTE — Telephone Encounter (Signed)
Pt called wanting to know if she needed to come back in for labs.. Please advise pt

## 2016-08-24 ENCOUNTER — Telehealth: Payer: Self-pay | Admitting: Family

## 2016-08-24 NOTE — Telephone Encounter (Signed)
Pt called. She would like to know what types of labs are ordered before coming in. She is not sure if she needs to come in. Please advise pt. [phone].

## 2016-08-24 NOTE — Telephone Encounter (Signed)
Scheduled lab appointment on [phone]

## 2016-08-31 DIAGNOSIS — M2142 Flat foot [pes planus] (acquired), left foot: Secondary | ICD-10-CM | POA: Diagnosis not present

## 2016-08-31 DIAGNOSIS — M7601 Gluteal tendinitis, right hip: Secondary | ICD-10-CM | POA: Diagnosis not present

## 2016-08-31 DIAGNOSIS — M5414 Radiculopathy, thoracic region: Secondary | ICD-10-CM | POA: Diagnosis not present

## 2016-09-05 DIAGNOSIS — M76822 Posterior tibial tendinitis, left leg: Secondary | ICD-10-CM | POA: Diagnosis not present

## 2016-09-06 DIAGNOSIS — Z9884 Bariatric surgery status: Secondary | ICD-10-CM | POA: Diagnosis not present

## 2016-09-06 DIAGNOSIS — Z48815 Encounter for surgical aftercare following surgery on the digestive system: Secondary | ICD-10-CM | POA: Diagnosis not present

## 2016-09-06 DIAGNOSIS — Z713 Dietary counseling and surveillance: Secondary | ICD-10-CM | POA: Diagnosis not present

## 2016-09-06 DIAGNOSIS — Z6837 Body mass index (BMI) 37.0-37.9, adult: Secondary | ICD-10-CM | POA: Diagnosis not present

## 2016-09-07 ENCOUNTER — Other Ambulatory Visit (INDEPENDENT_AMBULATORY_CARE_PROVIDER_SITE_OTHER): Payer: BLUE CROSS/BLUE SHIELD

## 2016-09-07 DIAGNOSIS — M79652 Pain in left thigh: Secondary | ICD-10-CM | POA: Diagnosis not present

## 2016-09-07 DIAGNOSIS — M79651 Pain in right thigh: Secondary | ICD-10-CM | POA: Diagnosis not present

## 2016-09-07 LAB — CBC WITH DIFFERENTIAL/PLATELET
Basophils Absolute: 0.1 10*3/uL (ref 0.0–0.1)
Basophils Relative: 0.7 % (ref 0.0–3.0)
Eosinophils Absolute: 0.3 10*3/uL (ref 0.0–0.7)
Eosinophils Relative: 3.3 % (ref 0.0–5.0)
HCT: 39.4 % (ref 36.0–46.0)
Hemoglobin: 13.6 g/dL (ref 12.0–15.0)
Lymphocytes Relative: 25.5 % (ref 12.0–46.0)
Lymphs Abs: 2.3 10*3/uL (ref 0.7–4.0)
MCHC: 34.6 g/dL (ref 30.0–36.0)
MCV: 86.3 fl (ref 78.0–100.0)
Monocytes Absolute: 0.5 10*3/uL (ref 0.1–1.0)
Monocytes Relative: 5.3 % (ref 3.0–12.0)
Neutro Abs: 5.8 10*3/uL (ref 1.4–7.7)
Neutrophils Relative %: 65.2 % (ref 43.0–77.0)
Platelets: 277 10*3/uL (ref 150.0–400.0)
RBC: 4.57 Mil/uL (ref 3.87–5.11)
RDW: 13 % (ref 11.5–15.5)
WBC: 8.9 10*3/uL (ref 4.0–10.5)

## 2016-09-26 DIAGNOSIS — M25472 Effusion, left ankle: Secondary | ICD-10-CM | POA: Diagnosis not present

## 2016-09-26 DIAGNOSIS — M722 Plantar fascial fibromatosis: Secondary | ICD-10-CM | POA: Diagnosis not present

## 2016-09-26 DIAGNOSIS — Q666 Other congenital valgus deformities of feet: Secondary | ICD-10-CM | POA: Diagnosis not present

## 2016-09-26 DIAGNOSIS — M25572 Pain in left ankle and joints of left foot: Secondary | ICD-10-CM | POA: Diagnosis not present

## 2016-09-29 DIAGNOSIS — M76822 Posterior tibial tendinitis, left leg: Secondary | ICD-10-CM | POA: Diagnosis not present

## 2016-10-13 DIAGNOSIS — H524 Presbyopia: Secondary | ICD-10-CM | POA: Diagnosis not present

## 2016-11-01 DIAGNOSIS — H5213 Myopia, bilateral: Secondary | ICD-10-CM | POA: Diagnosis not present

## 2016-11-15 ENCOUNTER — Ambulatory Visit: Payer: BLUE CROSS/BLUE SHIELD | Admitting: Internal Medicine

## 2017-01-06 DIAGNOSIS — M76822 Posterior tibial tendinitis, left leg: Secondary | ICD-10-CM | POA: Diagnosis not present

## 2017-01-20 DIAGNOSIS — M2142 Flat foot [pes planus] (acquired), left foot: Secondary | ICD-10-CM | POA: Diagnosis not present

## 2017-01-20 DIAGNOSIS — M25572 Pain in left ankle and joints of left foot: Secondary | ICD-10-CM | POA: Diagnosis not present

## 2017-01-20 DIAGNOSIS — S93692A Other sprain of left foot, initial encounter: Secondary | ICD-10-CM | POA: Diagnosis not present

## 2017-01-20 DIAGNOSIS — M76822 Posterior tibial tendinitis, left leg: Secondary | ICD-10-CM | POA: Diagnosis not present

## 2017-01-20 DIAGNOSIS — G8918 Other acute postprocedural pain: Secondary | ICD-10-CM | POA: Diagnosis not present

## 2017-01-20 DIAGNOSIS — M67874 Other specified disorders of tendon, left ankle and foot: Secondary | ICD-10-CM | POA: Diagnosis not present

## 2017-01-20 DIAGNOSIS — Z8249 Family history of ischemic heart disease and other diseases of the circulatory system: Secondary | ICD-10-CM | POA: Diagnosis not present

## 2017-01-20 DIAGNOSIS — G4730 Sleep apnea, unspecified: Secondary | ICD-10-CM | POA: Diagnosis not present

## 2017-01-20 DIAGNOSIS — I10 Essential (primary) hypertension: Secondary | ICD-10-CM | POA: Diagnosis not present

## 2017-01-21 DIAGNOSIS — M2142 Flat foot [pes planus] (acquired), left foot: Secondary | ICD-10-CM | POA: Diagnosis not present

## 2017-01-21 DIAGNOSIS — M76822 Posterior tibial tendinitis, left leg: Secondary | ICD-10-CM | POA: Diagnosis not present

## 2017-01-21 DIAGNOSIS — Z8249 Family history of ischemic heart disease and other diseases of the circulatory system: Secondary | ICD-10-CM | POA: Diagnosis not present

## 2017-01-21 DIAGNOSIS — I10 Essential (primary) hypertension: Secondary | ICD-10-CM | POA: Diagnosis not present

## 2017-02-02 DIAGNOSIS — M76822 Posterior tibial tendinitis, left leg: Secondary | ICD-10-CM | POA: Diagnosis not present

## 2017-02-16 DIAGNOSIS — M76822 Posterior tibial tendinitis, left leg: Secondary | ICD-10-CM | POA: Diagnosis not present

## 2017-03-02 DIAGNOSIS — M76822 Posterior tibial tendinitis, left leg: Secondary | ICD-10-CM | POA: Diagnosis not present

## 2017-03-07 ENCOUNTER — Ambulatory Visit: Payer: BLUE CROSS/BLUE SHIELD | Attending: Physician Assistant

## 2017-03-07 DIAGNOSIS — M25672 Stiffness of left ankle, not elsewhere classified: Secondary | ICD-10-CM

## 2017-03-07 DIAGNOSIS — M6281 Muscle weakness (generalized): Secondary | ICD-10-CM | POA: Diagnosis not present

## 2017-03-07 NOTE — Therapy (Signed)
[place] [facility] [address] [place], [address] Phone: [phone]   Fax:  [phone]  Physical Therapy Evaluation  Patient Details  Name: [name] MRN: [id number] Date of Birth: 1971/10/06 Referring Provider: Moses Manners PA-C  Encounter Date: 03/07/2017      PT End of Session - 03/07/17 1548    Visit Number 1   Number of Visits 17   Date for PT Re-Evaluation 05/02/17   Authorization Type no g codes   PT Start Time 1430   PT Stop Time 1530   PT Time Calculation (min) 60 min   Activity Tolerance Patient tolerated treatment well   Behavior During Therapy [facility] for tasks assessed/performed      Past Medical History:  Diagnosis Date  . Blood in urine 2014  . Breast mass 2013  . BV (bacterial vaginosis)   . Edema    legs  . Hypertension   . Thyroid disease     Past Surgical History:  Procedure Laterality Date  . BREAST BIOPSY Right 2013   benign - fatty tissue  . BREAST BIOPSY Left 06-12-13   fibroadenoma /core biopsy  . BREAST BIOPSY Right 06-17-13   fibrocystic /stereo biopsy  . CESAREAN SECTION  2007  . DILATION AND CURETTAGE OF UTERUS    . ROUX-EN-Y PROCEDURE  03/04/2014   Dr. Saint Lucia    There were no vitals filed for this visit.       Subjective Assessment - 03/07/17 1443    Subjective L ankle surgery   Pertinent History Pt reports chronic L foot pain and swelling as well as plantar fasciitis. She was initially scheduled to have surgery at [facility] a couple years ago but "chickened out." On 01/20/17 she underwent medial displacement calcaneal osteotomy left foot, debridement and repair of posterior tibial tendon left foot, repair spring ligament, transfer flexor digitorum longus tendon to navicular, and Strayer procedure to lengthen Achilles tendon. She reports no complications after surgery. She was non-weightbearing for the first 6 weeks. She went back to see her orthopedic surgeon  Thursday 03/02/17 and was placed in a post-op boot that she has been using since that time. She doesn't recall any current weightbearing restrictions and has been WBAT since that time with lofstrand crutches. Order received from surgeon for physical therapy states PWB progressing to FWB. Order is for AROM/PROM and strengthening. No further protocol or restrictions provided at this time. Pt denies any injury since surgery. She reports some problems with healing of the incision on the posterior calcaneus.    Patient Stated Goals Return to prior function with less pain   Currently in Pain? Yes   Pain Score 2   Worst: 5/10; Best: 1/10   Pain Location Ankle   Pain Orientation Left   Pain Descriptors / Indicators Squeezing   Pain Type Surgical pain   Pain Onset More than a month ago   Pain Frequency Constant   Aggravating Factors  Walking   Pain Relieving Factors Ice, rest   Multiple Pain Sites No            [facility] PT Assessment - 03/07/17 1507      Assessment   Medical Diagnosis L ankle surgery   Referring Provider Moses Manners PA-C   Onset Date/Surgical Date 01/20/17   Hand Dominance Right   Next MD Visit 03/23/17   Prior Therapy None     Precautions   Precautions None     Restrictions  Weight Bearing Restrictions Yes   Other Position/Activity Restrictions PWB progressing to FWB in post-op boot. To remain in boot for total of 6 weeks. Placed in boot 03/02/17     Balance Screen   Has the patient fallen in the past 6 months No   Has the patient had a decrease in activity level because of a fear of falling?  Yes     [place] Private residence   Living Arrangements Spouse/significant other;Children   Available Help at Discharge Family   Type of [place] to enter   Entrance Stairs-Number of Steps 4-6   Entrance Stairs-Rails Right;Left;Cannot reach both   [place] level;Able to live on main level with bedroom/bathroom    Alternate Level Stairs-Number of Steps 24   Alternate Level Stairs-Rails Left   Home Equipment Walker - 2 wheels;Shower seat;Bedside commode;Grab bars - tub/shower  Knee scooter     Prior Function   Level of Independence Independent   Vocation Full time employment   Vocation Requirements Sitting at computer   Leisure Gardening     Cognition   Overall Cognitive Status Within Functional Limits for tasks assessed     Observation/Other Assessments   Other Surveys  Other Surveys   Lower Extremity Functional Scale  26/80     Sensation   Additional Comments Intact sensation throughout bilateral LEs to light touch. Minimal swelling to L foot. Incisions are well healed with a very minimal eschar in a few small spaces. Posterior calcaneal incision is healing however still minimally open. Pt reports minimal drainage from site and small amount of staining noted on sock. No malodorous discharge, redness, warmth, or swelling. Pt denies chills, fevers, night sweats, or pain with light palpation. She reports that eschar just came off in the last couple days. Very minimal yellowish slough noted deep in the wound. Pt encouraged to continue to watch and if she has any concerns to contact her orthopedic surgeon     Posture/Postural Control   Posture Comments Grossly WFL     ROM / Strength   AROM / PROM / Strength AROM;Strength     AROM   Overall AROM Comments Within pain-free limits   AROM Assessment Site Ankle   Right/Left Ankle Left   Left Ankle Dorsiflexion -10   Left Ankle Plantar Flexion 40   Left Ankle Inversion 5   Left Ankle Eversion 12     Strength   Overall Strength Comments UE and RLE strength grossly WFL and 5/5. L knee flex/ext 5/5. Very light isometric strength intact to L foot. Able to flex/extend all toes. Formalized MMT of L ankle/foot deferred at this time due to post-op restrictions     Palpation   Palpation comment Painless to palpation along dorsal and ventral aspects of  foot as well as along scars     Ambulation/Gait   Gait Comments Arrives ambulating with lofstrant crutches and post-op boot        TREATMENT  Ther-ex Pt instructed in 4 way L ankle submaximal (<20% MVC) isometrics 3-5s hold x 5-10 each direction as tolerated; Pt issued written handout and provided demonstration as well as verbal and tactile cues for correct performance. Avoided to discontinue immediately if she experiences pain                   PT Education - 03/08/17 1119    Education provided Yes   Education Details Plan of care, HEP,  monitor posterior calcaneal incision healing   Person(s) Educated Patient   Methods Explanation;Demonstration;Verbal cues;Handout;Tactile cues   Comprehension Verbalized understanding;Returned demonstration             PT Long Term Goals - 03/08/17 1133      PT LONG TERM GOAL #1   Title Pt will be independent with HEP in order to improve L ankle/foot strength, mobility, and proprioception in order to decrease pain and improve function at home and work.    Time 8   Period Weeks   Status New     PT LONG TERM GOAL #2   Title Pt will increase LEFS by at least 9 points in order to demonstrate significant improvement in lower extremity function.    Baseline 03/07/17: 26/80   Time 8   Period Weeks   Status New     PT LONG TERM GOAL #3   Title Pt will improve L ankle strength for dorsiflexion, plantarflexion, inversion, and eversion to at least 4/5   Baseline 03/07/17: unable to test   Time 8   Period Weeks   Status New     PT LONG TERM GOAL #4   Title Pt will improve L ankle dorsiflexion to at least 10 degrees past neutral in order to improve normal gait mechanics and stair ascend/descent   Baseline 03/07/17: -10 degrees   Time 8   Period Weeks   Status New               Plan - 03/08/17 1120    Clinical Impression Statement Pt is a pleasant 53 female referred to PT s/p medial displacement calcaneal osteotomy  left foot, debridement and repair of posterior tibial tendon left foot, repair spring ligament, transfer flexor digitorum longus tendon to navicular, and Strayer procedure to lengthen Achilles tendon. She was placed in a post-op boot Thursday 03/02/17 and has been ambulating with lofstrand crutches since that time. Order received from orthopedics for physical therapy states PWB progressing to FWB, AROM/PROM, and strengthening. Incisions are well healed with a very minimal eschar in a few small places along incision. Posterior calcaneal incision is healing however still minimally open. Pt reports minimal drainage from site and small amount of staining noted on sock. No malodorous discharge, redness, warmth, or swelling. Pt denies chills, fevers, night sweats, or pain with light palpation. She reports that eschar came off in the last couple days. Very minimal yellowish slough noted deep in the wound. Pt encouraged to watch and notify orthopedics if she has any concerns. She demonstrates pain-free AROM of left ankle -10 dorsiflexion, 40 plantarflexion, 5 inversion, and 12 eversion. Formalized strength testing deferred due to post-op restrictions. Pt will benefit from skilled PT services to address deficits in L ankle strength, AROM/PROM, and proprioception in order to return to full pain-free function at home and work.    Rehab Potential Excellent   Clinical Impairments Affecting Rehab Potential Positive: motivation, age, support; Negative: chronicity of pain, extensive surgery   PT Frequency 2x / week   PT Duration 8 weeks   PT Treatment/Interventions ADLs/Self Care Home Management;Aquatic Therapy;Cryotherapy;Electrical Stimulation;Moist Heat;Iontophoresis 4mg /ml Dexamethasone;Ultrasound;Fluidtherapy;Contrast Bath;DME Instruction;Gait training;Stair training;Functional mobility training;Therapeutic activities;Therapeutic exercise;Balance training;Neuromuscular re-education;Patient/family education;Manual  techniques;Scar mobilization;Passive range of motion   PT Next Visit Plan ankle isometrics, introduce AROM for L ankle   PT Home Exercise Plan L ankle 4 way isometrics <20% of MVC   Consulted and Agree with Plan of Care Patient      Patient will benefit from skilled  therapeutic intervention in order to improve the following deficits and impairments:  Abnormal gait, Decreased balance, Decreased range of motion, Decreased strength, Hypomobility, Impaired flexibility, Pain  Visit Diagnosis: Stiffness of left ankle, not elsewhere classified - Plan: PT plan of care cert/re-cert  Muscle weakness (generalized) - Plan: PT plan of care cert/re-cert     Problem List Patient Active Problem List   Diagnosis Date Noted  . Thoracic back pain 08/18/2016  . Sinus congestion 12/22/2015  . Varicose veins 11/17/2015  . Menopausal symptoms 11/17/2015  . Hearing loss 05/25/2015  . Bilateral thigh pain 11/17/2014  . Obstructive sleep apnea 06/27/2013  . Microscopic hematuria 06/27/2013  . Renal colic 74/16/3845  . Routine general medical examination at a health care facility 05/15/2013  . Goiter, nontoxic, multinodular 02/25/2013  . Acquired flat foot 11/20/2012  . Obesity 11/12/2012  . Hypertension 05/09/2012   Phillips Grout PT, DPT   Siearra Amberg 03/08/2017, 11:50 AM  [place] [facility] [address] [place], [address] Phone: [phone]   Fax:  [phone]  Name: KALEA PERINE MRN: [id number] Date of Birth: August 02, 1971

## 2017-03-14 ENCOUNTER — Ambulatory Visit: Payer: BLUE CROSS/BLUE SHIELD | Admitting: Physical Therapy

## 2017-03-14 ENCOUNTER — Encounter: Payer: BLUE CROSS/BLUE SHIELD | Admitting: Obstetrics and Gynecology

## 2017-03-14 ENCOUNTER — Encounter: Payer: Self-pay | Admitting: Physical Therapy

## 2017-03-14 DIAGNOSIS — M25672 Stiffness of left ankle, not elsewhere classified: Secondary | ICD-10-CM | POA: Diagnosis not present

## 2017-03-14 DIAGNOSIS — M6281 Muscle weakness (generalized): Secondary | ICD-10-CM

## 2017-03-14 NOTE — Therapy (Signed)
[place] [facility] [address], [place], [address] Phone: [phone]   Fax:  [phone]  Physical Therapy Treatment  Patient Details  Name: LAURYL SEYER MRN: [id number] Date of Birth: 1971-01-10 Referring Provider: Moses Manners PA-C  Encounter Date: 03/14/2017      PT End of Session - 03/14/17 1436    Visit Number 2   Number of Visits 17   Date for PT Re-Evaluation 05/02/17   Authorization Type no g codes   PT Start Time 7616   PT Stop Time 1514   PT Time Calculation (min) 40 min   Activity Tolerance Patient tolerated treatment well   Behavior During Therapy [facility] for tasks assessed/performed      Past Medical History:  Diagnosis Date  . Blood in urine 2014  . Breast mass 2013  . BV (bacterial vaginosis)   . Edema    legs  . Hypertension   . Thyroid disease     Past Surgical History:  Procedure Laterality Date  . BREAST BIOPSY Right 2013   benign - fatty tissue  . BREAST BIOPSY Left 06-12-13   fibroadenoma /core biopsy  . BREAST BIOPSY Right 06-17-13   fibrocystic /stereo biopsy  . CESAREAN SECTION  2007  . DILATION AND CURETTAGE OF UTERUS    . ROUX-EN-Y PROCEDURE  03/04/2014   Dr. Saint Lucia    There were no vitals filed for this visit.      Subjective Assessment - 03/14/17 1438    Subjective Pt reports her incision is healing and she has been monitoring it closely. She reports she hasn't had much feeling to her lateral L foot since surgery.  Pt reports increased sensativity on heel which irritates her at night when it rubs against the sheet and she has been elevating her heels to avoid this.     Pertinent History Pt reports chronic L foot pain and swelling as well as plantar fasciitis. She was initially scheduled to have surgery at [facility] a couple years ago but "chickened out." On 01/20/17 she underwent medial displacement calcaneal osteotomy left foot, debridement and repair of posterior tibial  tendon left foot, repair spring ligament, transfer flexor digitorum longus tendon to navicular, and Strayer procedure to lengthen Achilles tendon. She reports no complications after surgery. She was non-weightbearing for the first 6 weeks. She went back to see her orthopedic surgeon Thursday 03/02/17 and was placed in a post-op boot that she has been using since that time. She doesn't recall any current weightbearing restrictions and has been WBAT since that time with lofstrand crutches. Order received from surgeon for physical therapy states PWB progressing to FWB. Order is for AROM/PROM and strengthening. No further protocol or restrictions provided at this time. Pt denies any injury since surgery. She reports some problems with healing of the incision on the posterior calcaneus.    Patient Stated Goals Return to prior function with less pain   Currently in Pain? No/denies       TREATMENT    Observation: noted that pt continues to present with minimal yellow slough from wound on heel. Pt reports she has been monitoring this since last session and that the size of the wound has gotten smaller and she has noticed less slough. Pt instructed to continue to monitor and to inform her MD if this worsens. Pt instructed to take a picture of her wound today after session and to bring this to her next session for comparison from  session to session.    Manual Therapy:  Light massage distal>proximal starting at toes and up past ankle for decreased swelling as effusion noted throughout L foot and ankle x6 minutes.   Scar massage along L medial foot/ankle where adhesions noted x5 minutes   PROM into L ankle inversion, eversion, PF, DF with very light gentle stretch x5 second holds     Therapeutic Exercise:  Densensatization with very light towel rub to medial and lateral heel where pt reported very high sensativity. 8 minutes (Added to HEP)   Seated 4 way L ankle submaximal (<20% MVC) isometrics 3-5s hold  x 5-10 each direction as tolerated   Small gentle ankle circles x20 clockwise/counterclockwise in hooklying with L foot hanging off end of mat table (added to HEP)   Pt noted to not be ambulating with crutches to PT session. Pt reports she has been trialing ambulating without these. Pt instructed to continue to use crutches for now but to gradually reduce amount of WBing through crutches as she can tolerate for a progression from PWB>FWB as indicated on referral. Pt educated on the potential negative effects of transitioning to quickly and abruptly to FWB. Pt verbalized understanding.                 PT Education - 03/14/17 1435    Education provided Yes   Education Details Exercise technique; desensatization technique   Person(s) Educated Patient   Methods Explanation;Demonstration   Comprehension Verbalized understanding;Returned demonstration;Tactile cues required;Need further instruction             PT Long Term Goals - 03/08/17 1133      PT LONG TERM GOAL #1   Title Pt will be independent with HEP in order to improve L ankle/foot strength, mobility, and proprioception in order to decrease pain and improve function at home and work.    Time 8   Period Weeks   Status New     PT LONG TERM GOAL #2   Title Pt will increase LEFS by at least 9 points in order to demonstrate significant improvement in lower extremity function.    Baseline 03/07/17: 26/80   Time 8   Period Weeks   Status New     PT LONG TERM GOAL #3   Title Pt will improve L ankle strength for dorsiflexion, plantarflexion, inversion, and eversion to at least 4/5   Baseline 03/07/17: unable to test   Time 8   Period Weeks   Status New     PT LONG TERM GOAL #4   Title Pt will improve L ankle dorsiflexion to at least 10 degrees past neutral in order to improve normal gait mechanics and stair ascend/descent   Baseline 03/07/17: -10 degrees   Time 8   Period Weeks   Status New                Plan - 03/14/17 1616    Clinical Impression Statement Pt's L wound on heel inspected which appears to be approving since last session but pt instructed to contact her MD if she notices any additional slough or drainage from wound.  She presents with scar tissue adhesions which was addressed with scar massage.  She reports hyperalgesia on heel and densensatization technique using towel was introduced today and added to pt's HEP.  Pt educated on proper gradual transition from PWB>FWB using crutches.  She tolerated all interventions well and will benefit from continued skilled PT interventions for improved ROM, strength, decreased pain,  and improved QOL.   Rehab Potential Excellent   Clinical Impairments Affecting Rehab Potential Positive: motivation, age, support; Negative: chronicity of pain, extensive surgery   PT Frequency 2x / week   PT Duration 8 weeks   PT Treatment/Interventions ADLs/Self Care Home Management;Aquatic Therapy;Cryotherapy;Electrical Stimulation;Moist Heat;Iontophoresis 4mg /ml Dexamethasone;Ultrasound;Fluidtherapy;Contrast Bath;DME Instruction;Gait training;Stair training;Functional mobility training;Therapeutic activities;Therapeutic exercise;Balance training;Neuromuscular re-education;Patient/family education;Manual techniques;Scar mobilization;Passive range of motion   PT Next Visit Plan ankle isometrics, introduce AROM for L ankle   PT Home Exercise Plan L ankle 4 way isometrics <20% of MVC   Consulted and Agree with Plan of Care Patient      Patient will benefit from skilled therapeutic intervention in order to improve the following deficits and impairments:  Abnormal gait, Decreased balance, Decreased range of motion, Decreased strength, Hypomobility, Impaired flexibility, Pain  Visit Diagnosis: Stiffness of left ankle, not elsewhere classified  Muscle weakness (generalized)     Problem List Patient Active Problem List   Diagnosis Date Noted  . Thoracic back pain  08/18/2016  . Sinus congestion 12/22/2015  . Varicose veins 11/17/2015  . Menopausal symptoms 11/17/2015  . Hearing loss 05/25/2015  . Bilateral thigh pain 11/17/2014  . Obstructive sleep apnea 06/27/2013  . Microscopic hematuria 06/27/2013  . Renal colic 88/75/7972  . Routine general medical examination at a health care facility 05/15/2013  . Goiter, nontoxic, multinodular 02/25/2013  . Acquired flat foot 11/20/2012  . Obesity 11/12/2012  . Hypertension 05/09/2012    Collie Siad PT, DPT 03/14/2017, 4:23 PM  [place] [facility] [address], [place], [address] Phone: [phone]   Fax:  [phone]  Name: KAMORIE ALDOUS MRN: [id number] Date of Birth: 11-01-1971

## 2017-03-15 ENCOUNTER — Ambulatory Visit (INDEPENDENT_AMBULATORY_CARE_PROVIDER_SITE_OTHER): Payer: BLUE CROSS/BLUE SHIELD | Admitting: Obstetrics and Gynecology

## 2017-03-15 ENCOUNTER — Encounter: Payer: Self-pay | Admitting: Obstetrics and Gynecology

## 2017-03-15 VITALS — BP 99/66 | HR 81 | Ht 66.0 in | Wt 253.2 lb

## 2017-03-15 DIAGNOSIS — N898 Other specified noninflammatory disorders of vagina: Secondary | ICD-10-CM | POA: Diagnosis not present

## 2017-03-15 NOTE — Patient Instructions (Signed)
1. Vaginal discharge appears normal today 2. Nu swab culture is sent off to assess for yeast and bacterial vaginosis 3. Results will be made available through my chart 4. Return in May for annual physical

## 2017-03-15 NOTE — Progress Notes (Signed)
Chief complaint: 1. Vaginal discharge 2. Vaginal odor  Patient presents for evaluation of vaginitis. She has not had any new sex partners. She has not self treated with any medications. She has not been given any recent antibiotics. The patient did undergo ankle surgery in the past month. She is noting cycle irregularity now with having had vaginal bleeding twice in the past month. Prior intervals were ranging anywhere from 21-28 days patient denies pelvic pain.  Past medical history, past surgical history, problem list, medications, and allergies are reviewed  OBJECTIVE: BP 99/66   Pulse 81   Ht 5\' 6"  (1.676 m)   Wt 253 lb 3.2 oz (114.9 kg)   LMP 02/27/2017 (Exact Date)   BMI 40.87 kg/m  Pleasant female in no acute distress. Alert and oriented. Abdomen: Soft, nontender Pelvic exam: External genitalia-normal BUS-normal Vagina-clear secretions and vaginal vault; no odor Cervix-no lesions; no discharge; no cervical motion tenderness Uterus-midplane, mobile, normal size and shape, nontender Adnexa-nonpalpable and nontender Rectovaginal-normal external exam  PROCEDURE: Wet prep Normal saline-mild to moderate white blood cells; no clue cells; no Trichomonas KOH-no yeast; negative whiff test  ASSESSMENT: 1. Vaginal discharge with unremarkable wet prep today 2. Irregular menstrual cycles 3. Recent ankle surgery  PLAN: 1. Nu swab is sent 2. Wet prep as noted 3. Maintain menstrual calendar monitoring 4. Return in May 2018 for annual exam  A total of 15 minutes were spent face-to-face with the patient during this encounter and over half of that time dealt with counseling and coordination of care.  Brayton Mars, MD  Note: This dictation was prepared with Dragon dictation along with smaller phrase technology. Any transcriptional errors that result from this process are unintentional.

## 2017-03-15 NOTE — Addendum Note (Signed)
Addended by: Elouise Munroe on: 03/15/2017 02:17 PM   Modules accepted: Orders

## 2017-03-16 ENCOUNTER — Ambulatory Visit: Payer: BLUE CROSS/BLUE SHIELD

## 2017-03-16 DIAGNOSIS — M6281 Muscle weakness (generalized): Secondary | ICD-10-CM | POA: Diagnosis not present

## 2017-03-16 DIAGNOSIS — M25672 Stiffness of left ankle, not elsewhere classified: Secondary | ICD-10-CM

## 2017-03-16 NOTE — Therapy (Signed)
[facility] [address], [place], [address] Phone: [phone]   Fax:  [phone]  Physical Therapy Treatment  Patient Details  Name: [name] MRN: [id number] Date of Birth: 1971-01-23 Referring Provider: Moses Manners PA-C  Encounter Date: 03/16/2017      PT End of Session - 03/16/17 1451    Visit Number 3   Number of Visits 17   Date for PT Re-Evaluation 05/02/17   Authorization Type no g codes   PT Start Time 7408   PT Stop Time 1530   PT Time Calculation (min) 45 min   Activity Tolerance Patient tolerated treatment well   Behavior During Therapy [facility] for tasks assessed/performed      Past Medical History:  Diagnosis Date  . Blood in urine 2014  . Breast mass 2013  . BV (bacterial vaginosis)   . Edema    legs  . Hypertension   . Thyroid disease     Past Surgical History:  Procedure Laterality Date  . BREAST BIOPSY Right 2013   benign - fatty tissue  . BREAST BIOPSY Left 06-12-13   fibroadenoma /core biopsy  . BREAST BIOPSY Right 06-17-13   fibrocystic /stereo biopsy  . CESAREAN SECTION  2007  . DILATION AND CURETTAGE OF UTERUS    . FOOT SURGERY Left    01/2017  . ROUX-EN-Y PROCEDURE  03/04/2014   Dr. Saint Lucia  . TUBAL LIGATION      There were no vitals filed for this visit.      Subjective Assessment - 03/16/17 1450    Subjective Pt reports she is doing well at this time. She is performing R ankle circles at home and denies any pain. She denies pain upon arrival. No specific questions or concerns at this time.    Pertinent History Pt reports chronic L foot pain and swelling as well as plantar fasciitis. She was initially scheduled to have surgery at [facility] a couple years ago but "chickened out." On 01/20/17 she underwent medial displacement calcaneal osteotomy left foot, debridement and repair of posterior tibial tendon left foot, repair spring ligament, transfer flexor digitorum  longus tendon to navicular, and Strayer procedure to lengthen Achilles tendon. She reports no complications after surgery. She was non-weightbearing for the first 6 weeks. She went back to see her orthopedic surgeon Thursday 03/02/17 and was placed in a post-op boot that she has been using since that time. She doesn't recall any current weightbearing restrictions and has been WBAT since that time with lofstrand crutches. Order received from surgeon for physical therapy states PWB progressing to FWB. Order is for AROM/PROM and strengthening. No further protocol or restrictions provided at this time. Pt denies any injury since surgery. She reports some problems with healing of the incision on the posterior calcaneus.    Patient Stated Goals Return to prior function with less pain   Currently in Pain? No/denies             TREATMENT  Manual Therapy:  Scar massage along L medial foot/ankle where adhesions noted x 5 minutes  Gentle PROM into L ankle inversion, eversion, PF, DF with very light gentle stretch x 5 second holds   Therapeutic Exercise:  Small gentle ankle circles x20 clockwise/counterclockwise in hooklying with L foot hanging off end of mat table Ankle alphabets A-Z; Seated 4 way L ankle submaximal (<20% MVC) isometrics 3-5s hold x 5-10 each direction as tolerated  Seated BAPS forward/backwards x  20; Seated BAPS CW/CCW x 20 each; Seated ankle eversion/inversion AROM with towel on floor x 10 each direction; Education about plan of care and progression, pt appears mildly anxious about injuring her L ankle accidentally.                         PT Education - 03/16/17 1451    Education provided Yes   Education Details Reinforced HEP, added ankle alphabets and calf stretch   Person(s) Educated Patient   Methods Explanation;Verbal cues;Demonstration;Handout   Comprehension Verbalized understanding;Returned demonstration             PT Long Term Goals -  03/08/17 1133      PT LONG TERM GOAL #1   Title Pt will be independent with HEP in order to improve L ankle/foot strength, mobility, and proprioception in order to decrease pain and improve function at home and work.    Time 8   Period Weeks   Status New     PT LONG TERM GOAL #2   Title Pt will increase LEFS by at least 9 points in order to demonstrate significant improvement in lower extremity function.    Baseline 03/07/17: 26/80   Time 8   Period Weeks   Status New     PT LONG TERM GOAL #3   Title Pt will improve L ankle strength for dorsiflexion, plantarflexion, inversion, and eversion to at least 4/5   Baseline 03/07/17: unable to test   Time 8   Period Weeks   Status New     PT LONG TERM GOAL #4   Title Pt will improve L ankle dorsiflexion to at least 10 degrees past neutral in order to improve normal gait mechanics and stair ascend/descent   Baseline 03/07/17: -10 degrees   Time 8   Period Weeks   Status New               Plan - 03/16/17 1452    Clinical Impression Statement Pt continues to demonstrate good progress with therapy at this time. The wound on her posterior heel continues to heel. She has been performing AROM ankle circles without pain. Added ankle alphabets and gentle seated gastroc towel stretch to HEP. Will add light theraband resisted ankle 4 ways at next therapy session. Pt encouraged to continue additional HEP and follow-up as scheduled.    Rehab Potential Excellent   Clinical Impairments Affecting Rehab Potential Positive: motivation, age, support; Negative: chronicity of pain, extensive surgery   PT Frequency 2x / week   PT Duration 8 weeks   PT Treatment/Interventions ADLs/Self Care Home Management;Aquatic Therapy;Cryotherapy;Electrical Stimulation;Moist Heat;Iontophoresis 4mg /ml Dexamethasone;Ultrasound;Fluidtherapy;Contrast Bath;DME Instruction;Gait training;Stair training;Functional mobility training;Therapeutic activities;Therapeutic  exercise;Balance training;Neuromuscular re-education;Patient/family education;Manual techniques;Scar mobilization;Passive range of motion   PT Next Visit Plan theraband resisted ankle 4 ways (add to HEP if pt can tolerate), continue AROM/PROM, scar massage   PT Home Exercise Plan L ankle 4 way isometrics <20% of MVC, ankle circles, ankle alphabets, gentle towel gastroc stretch   Consulted and Agree with Plan of Care Patient      Patient will benefit from skilled therapeutic intervention in order to improve the following deficits and impairments:  Abnormal gait, Decreased balance, Decreased range of motion, Decreased strength, Hypomobility, Impaired flexibility, Pain  Visit Diagnosis: Stiffness of left ankle, not elsewhere classified  Muscle weakness (generalized)     Problem List Patient Active Problem List   Diagnosis Date Noted  . Thoracic back pain 08/18/2016  .  Sinus congestion 12/22/2015  . Varicose veins 11/17/2015  . Menopausal symptoms 11/17/2015  . Hearing loss 05/25/2015  . Bilateral thigh pain 11/17/2014  . Obstructive sleep apnea 06/27/2013  . Microscopic hematuria 06/27/2013  . Renal colic 81/77/1165  . Routine general medical examination at a health care facility 05/15/2013  . Goiter, nontoxic, multinodular 02/25/2013  . Acquired flat foot 11/20/2012  . Obesity 11/12/2012  . Hypertension 05/09/2012    Huprich,Jason 03/16/2017, 5:37 PM  [facility] [address] [place], [address] Phone: [phone]   Fax:  [phone]  Name: NAYELIS BONITO MRN: [id number] Date of Birth: [birth year]

## 2017-03-19 LAB — NUSWAB BV AND CANDIDA, NAA
Candida albicans, NAA: NEGATIVE
Candida glabrata, NAA: NEGATIVE

## 2017-03-21 ENCOUNTER — Ambulatory Visit: Payer: BLUE CROSS/BLUE SHIELD | Attending: Physician Assistant | Admitting: Physical Therapy

## 2017-03-21 ENCOUNTER — Encounter: Payer: Self-pay | Admitting: Physical Therapy

## 2017-03-21 DIAGNOSIS — M6281 Muscle weakness (generalized): Secondary | ICD-10-CM | POA: Diagnosis not present

## 2017-03-21 DIAGNOSIS — M25672 Stiffness of left ankle, not elsewhere classified: Secondary | ICD-10-CM

## 2017-03-21 NOTE — Therapy (Signed)
[place] Mar 27, [address] [place], [address] Phone: [phone]   Fax:  [phone]  Physical Therapy Treatment  Patient Details  Name: [name] MRN: [id number] Date of Birth: 25-Oct-1971 Referring Provider: Moses Manners PA-C  Encounter Date: 03/21/2017      PT End of Session - 03/21/17 1604    Visit Number 4   Number of Visits 17   Date for PT Re-Evaluation 05/02/17   Authorization Type no g codes   PT Start Time 03/27/1602   PT Stop Time 1657   PT Time Calculation (min) 54 min   Activity Tolerance Patient tolerated treatment well   Behavior During Therapy [facility] for tasks assessed/performed      Past Medical History:  Diagnosis Date  . Blood in urine 27-Mar-2013  . Breast mass 2012/03/27  . BV (bacterial vaginosis)   . Edema    legs  . Hypertension   . Thyroid disease     Past Surgical History:  Procedure Laterality Date  . BREAST BIOPSY Right 03/27/2012   benign - fatty tissue  . BREAST BIOPSY Left 06-12-13   fibroadenoma /core biopsy  . BREAST BIOPSY Right 06-17-13   fibrocystic /stereo biopsy  . CESAREAN SECTION  2006-03-27  . DILATION AND CURETTAGE OF UTERUS    . FOOT SURGERY Left    01/2017  . ROUX-EN-Y PROCEDURE  03/04/2014   Dr. Saint Lucia  . TUBAL LIGATION      There were no vitals filed for this visit.      Subjective Assessment - 03/21/17 1606    Subjective Pt reports she is not using AD in home but uses either crutches or scooter in community as she has L knee pain with prolonged walking.  Pt reports she believes it is swollen as she drove to work today (~45 minutes).  Pt reports having pain along lateral thighs Bil when sleeping.  Pain occurs when she is lying on the opposite hip.  She reports she wakes up 2-3x/night due to this pain.  She has experienced this in the past before the surgery for a few months but has not had treatment for this.  She mentioned this to her MD back when it was hurting.  She  only experiences her pain when sleeping, never when OOB.   Pertinent History Pt reports chronic L foot pain and swelling as well as plantar fasciitis. She was initially scheduled to have surgery at [facility] a couple years ago but "chickened out." On 01/20/17 she underwent medial displacement calcaneal osteotomy left foot, debridement and repair of posterior tibial tendon left foot, repair spring ligament, transfer flexor digitorum longus tendon to navicular, and Strayer procedure to lengthen Achilles tendon. She reports no complications after surgery. She was non-weightbearing for the first 6 weeks. She went back to see her orthopedic surgeon Thursday 03/02/17 and was placed in a post-op boot that she has been using since that time. She doesn't recall any current weightbearing restrictions and has been WBAT since that time with lofstrand crutches. Order received from surgeon for physical therapy states PWB progressing to FWB. Order is for AROM/PROM and strengthening. No further protocol or restrictions provided at this time. Pt denies any injury since surgery. She reports some problems with healing of the incision on the posterior calcaneus.    Patient Stated Goals Return to prior function with less pain   Currently in Pain? No/denies        TREATMENT  Manual Therapy:  Light massage distal>proximal starting at toes and up past ankle for decreased swelling as effusion noted throughout L foot and ankle x6 minutes.   Scar massage along L medial foot/ankle where adhesions noted x5 minutes   PROM into L ankle inversion, eversion, PF, DF with very light gentle stretch x5 each direction with 10 second holds   Desensitization with towel rub to medial and lateral heel where pt reported very high sensitivity. 4 minutes. Again x1 minute with balance stone covered in pillow case (not as sensitive with this).    Therapeutic Exercise:  Ankle circles x20 clockwise/counterclockwise in supine   Seated 4 way L ankle  submaximal (<20% MVC) isometrics 5s hold x 10 each direction   Seated BAPS Level 2 forward/backwards x 20   Seated BAPS Level 2 inversion/eversion x 20, challenging for pt to control   Seated BAPS Level 2 CW/CCW x 20 each  Seated L DF, PF with YTB resistance while pt has sock on. x10 each direction (added to HEP)   Seated L Inversion, Eversion AROM x10 each direction (added to HEP) as pt unable to perform with YTB.                PT Education - 03/21/17 1604    Education provided Yes   Education Details Exercise technique; instructed pt to sleep with pillow between knees and report back about lateral thigh pain   Person(s) Educated Patient   Methods Explanation;Demonstration   Comprehension Verbalized understanding;Returned demonstration;Need further instruction             PT Long Term Goals - 03/08/17 1133      PT LONG TERM GOAL #1   Title Pt will be independent with HEP in order to improve L ankle/foot strength, mobility, and proprioception in order to decrease pain and improve function at home and work.    Time 8   Period Weeks   Status New     PT LONG TERM GOAL #2   Title Pt will increase LEFS by at least 9 points in order to demonstrate significant improvement in lower extremity function.    Baseline 03/07/17: 26/80   Time 8   Period Weeks   Status New     PT LONG TERM GOAL #3   Title Pt will improve L ankle strength for dorsiflexion, plantarflexion, inversion, and eversion to at least 4/5   Baseline 03/07/17: unable to test   Time 8   Period Weeks   Status New     PT LONG TERM GOAL #4   Title Pt will improve L ankle dorsiflexion to at least 10 degrees past neutral in order to improve normal gait mechanics and stair ascend/descent   Baseline 03/07/17: -10 degrees   Time 8   Period Weeks   Status New               Plan - 03/21/17 1705    Clinical Impression Statement Pt is making good progress with mobility of L foot/ankle.  She has  difficulty with inversion and eversion so AROM in inversion/eversion added to HEP.  Will progress to this with theraband once pt's strength improves.  Pt able to perform L ankle PF and DF with YTB and added to HEP.  Her L heel is healing well.  Pt will benefit from continued skilled PT interventions for improved ROM, strength, and functional mobility.   Rehab Potential Excellent   Clinical Impairments Affecting Rehab Potential Positive: motivation, age, support; Negative:  chronicity of pain, extensive surgery   PT Frequency 2x / week   PT Duration 8 weeks   PT Treatment/Interventions ADLs/Self Care Home Management;Aquatic Therapy;Cryotherapy;Electrical Stimulation;Moist Heat;Iontophoresis 4mg /ml Dexamethasone;Ultrasound;Fluidtherapy;Contrast Bath;DME Instruction;Gait training;Stair training;Functional mobility training;Therapeutic activities;Therapeutic exercise;Balance training;Neuromuscular re-education;Patient/family education;Manual techniques;Scar mobilization;Passive range of motion   PT Next Visit Plan theraband resisted ankle 4 ways (add to HEP if pt can tolerate), continue AROM/PROM, scar massage   PT Home Exercise Plan L ankle 4 way isometrics <20% of MVC, ankle circles, ankle alphabets, gentle towel gastroc stretch   Consulted and Agree with Plan of Care Patient      Patient will benefit from skilled therapeutic intervention in order to improve the following deficits and impairments:  Abnormal gait, Decreased balance, Decreased range of motion, Decreased strength, Hypomobility, Impaired flexibility, Pain  Visit Diagnosis: Stiffness of left ankle, not elsewhere classified  Muscle weakness (generalized)     Problem List Patient Active Problem List   Diagnosis Date Noted  . Thoracic back pain 08/18/2016  . Sinus congestion 12/22/2015  . Varicose veins 11/17/2015  . Menopausal symptoms 11/17/2015  . Hearing loss 05/25/2015  . Bilateral thigh pain 11/17/2014  . Obstructive sleep  apnea 06/27/2013  . Microscopic hematuria 06/27/2013  . Renal colic 68/07/8109  . Routine general medical examination at a health care facility 05/15/2013  . Goiter, nontoxic, multinodular 02/25/2013  . Acquired flat foot 11/20/2012  . Obesity 11/12/2012  . Hypertension 05/09/2012     Collie Siad PT, DPT 03/21/2017, 5:09 PM  [facility] [address] [place], [address] Phone: [phone]   Fax:  [phone]  Name: [name] MRN: [id number] Date of Birth: March 08, 1971

## 2017-03-21 NOTE — Patient Instructions (Addendum)
ANKLE: Eversion, Unilateral (Band)    **Perform without band for now.  Place band around left foot. Keeping heel in place, raise toes of banded foot up and away from body. Do not move hip. Use yellow band. 10 reps per set, 2 sets each time. 2 times each day.  Copyright  VHI. All rights reserved.  ANKLE: Inversion, Unilateral (Band)    ** Perform without band for now.  Placing band around left foot. Keeping heel in place, lift toes of banded foot up and in. Do not move hip. Use yellow band. 10 reps per set, perform 2 sets. 2 times each day.  Copyright  VHI. All rights reserved.  ANKLE: Dorsiflexion (Band)    Sit at edge of surface. Place band around top of foot. Keeping heel on floor, raise toes of banded foot. Use yellow band. 10 reps per set, complete 2 sets.  Perform 2 times each day.  Copyright  VHI. All rights reserved.  ANKLE: Plantarflexion - Sitting (Band)    Place band around foot; hold other end. Sit at edge of sitting surface. Keep heel in place. Push foot down against band. Hold 10 seconds. Use yellow band. 10 reps per set, complete 2 sets.  Perform 2 times each day.  Copyright  VHI. All rights reserved.

## 2017-03-23 ENCOUNTER — Ambulatory Visit: Payer: BLUE CROSS/BLUE SHIELD

## 2017-03-23 DIAGNOSIS — M25672 Stiffness of left ankle, not elsewhere classified: Secondary | ICD-10-CM | POA: Diagnosis not present

## 2017-03-23 DIAGNOSIS — M6281 Muscle weakness (generalized): Secondary | ICD-10-CM

## 2017-03-23 NOTE — Therapy (Signed)
[facility] [address] [place], [address] Phone: [phone]   Fax:  [phone]  Physical Therapy Treatment  Patient Details  Name: [name] MRN: [id number] Date of Birth: 03-24-1971 Referring Provider: Moses Manners PA-C  Encounter Date: 03/23/2017      PT End of Session - 03/23/17 0911    Visit Number 5   Number of Visits 17   Date for PT Re-Evaluation 05/02/17   Authorization Type no g codes   PT Start Time 0905   PT Stop Time 0950   PT Time Calculation (min) 45 min   Activity Tolerance Patient tolerated treatment well   Behavior During Therapy [facility] for tasks assessed/performed      Past Medical History:  Diagnosis Date  . Blood in urine April 08, 2013  . Breast mass 2012-04-08  . BV (bacterial vaginosis)   . Edema    legs  . Hypertension   . Thyroid disease     Past Surgical History:  Procedure Laterality Date  . BREAST BIOPSY Right 04-08-12   benign - fatty tissue  . BREAST BIOPSY Left 06-12-13   fibroadenoma /core biopsy  . BREAST BIOPSY Right 06-17-13   fibrocystic /stereo biopsy  . CESAREAN SECTION  April 08, 2006  . DILATION AND CURETTAGE OF UTERUS    . FOOT SURGERY Left    01/2017  . ROUX-EN-Y PROCEDURE  03/04/2014   Dr. Saint Lucia  . TUBAL LIGATION      There were no vitals filed for this visit.      Subjective Assessment - 03/23/17 0910    Subjective Pt reports she is doing well on this date. She continues to have lateral thigh soreness after sleeping which is a chronic problem for her. She tried to put a pillow between her knees but states that it was difficulty to keep the pillow in place. No L ankle pain upon arrival. No specific questions or concerns at this time. She is performing HEP but notices increased difficutly with AROM L ankle inversion/eversion.    Pertinent History Pt reports chronic L foot pain and swelling as well as plantar fasciitis. She was initially scheduled to have surgery at  [facility] a couple years ago but "chickened out." On 01/20/17 she underwent medial displacement calcaneal osteotomy left foot, debridement and repair of posterior tibial tendon left foot, repair spring ligament, transfer flexor digitorum longus tendon to navicular, and Strayer procedure to lengthen Achilles tendon. She reports no complications after surgery. She was non-weightbearing for the first 6 weeks. She went back to see her orthopedic surgeon Thursday 03/02/17 and was placed in a post-op boot that she has been using since that time. She doesn't recall any current weightbearing restrictions and has been WBAT since that time with lofstrand crutches. Order received from surgeon for physical therapy states PWB progressing to FWB. Order is for AROM/PROM and strengthening. No further protocol or restrictions provided at this time. Pt denies any injury since surgery. She reports some problems with healing of the incision on the posterior calcaneus.    Patient Stated Goals Return to prior function with less pain   Currently in Pain? No/denies            [facility] PT Assessment - 03/23/17 0957      ROM / Strength   AROM / PROM / Strength AROM;PROM     AROM   Overall AROM Comments Within pain-free limits   AROM Assessment Site Ankle   Right/Left Ankle  Left   Left Ankle Dorsiflexion 0   Left Ankle Plantar Flexion 40   Left Ankle Inversion 5   Left Ankle Eversion 15     PROM   Overall PROM Comments Within pain-free limits   PROM Assessment Site Ankle   Right/Left Ankle Left   Left Ankle Dorsiflexion 10   Left Ankle Plantar Flexion 42   Left Ankle Inversion 10   Left Ankle Eversion 25        TREATMENT   Manual Therapy:  PROM into L ankle inversion, eversion, PF, DF with very light gentle stretch x 5 each direction with 10 second holds  Light massage distal>proximal starting at toes and up past ankle for decreased swelling as effusion noted throughout L foot and ankle x 6 minutes.  Scar  massage along L medial foot/ankle where adhesions noted x 5 minutes   Therapeutic Exercise:  L ankle alphabets Ankle circles x20 clockwise/counterclockwise in supine  Seated 4 way L ankle submaximal (<20% MVC) isometrics 5s hold x 10 each direction  Seated L DF, PF, inversion, and eversion with YTB resistance 3s hold. x10 each; direction, cues provided to improve eversion/eversion Seated L plantarflexion with RTB resistance 3s hold x 10 (added red tband for HEP for plantarflexion only); Seated BAPS Level 3 forward/backwards x 20  Seated BAPS Level 3 inversion/eversion x 20, challenging for pt to control, ball between knees to avoid hip rotation; Seated BAPS Level 3 CW/CCW x 20 each, ball between knees to avoid hip rotation;  L ankle ROM measurements (all pain-free): AROM: DF 0; PF: 40; In: 5 Ev: 15 PROM: DF 10: PF: 42; In: 10 Ev: 25   Pt has good isometric strength in all directions against resistance, maximum contraction strength not tested;                 PT Education - 03/23/17 0911    Education provided Yes   Education Details HEP reinforced, added red band for plantarflexion/dorsiflexion   Person(s) Educated Patient   Methods Explanation;Demonstration   Comprehension Verbalized understanding;Returned demonstration             PT Long Term Goals - 03/08/17 1133      PT LONG TERM GOAL #1   Title Pt will be independent with HEP in order to improve L ankle/foot strength, mobility, and proprioception in order to decrease pain and improve function at home and work.    Time 8   Period Weeks   Status New     PT LONG TERM GOAL #2   Title Pt will increase LEFS by at least 9 points in order to demonstrate significant improvement in lower extremity function.    Baseline 03/07/17: 26/80   Time 8   Period Weeks   Status New     PT LONG TERM GOAL #3   Title Pt will improve L ankle strength for dorsiflexion, plantarflexion, inversion, and eversion to at least 4/5    Baseline 03/07/17: unable to test   Time 8   Period Weeks   Status New     PT LONG TERM GOAL #4   Title Pt will improve L ankle dorsiflexion to at least 10 degrees past neutral in order to improve normal gait mechanics and stair ascend/descent   Baseline 03/07/17: -10 degrees   Time 8   Period Weeks   Status New               Plan - 03/23/17 0911    Clinical Impression  Statement Pt is making excellent progress with physical therapy. She has improved her pain-free L ankle AROM/PROM. Please see note for measurements. Pt has progressed to yellow band resisted ankle 4 ways but still struggles with inversion/eversion. Pt provided added resistance for dorsiflexion/plantarflexion. Pt encouraged to continue HEP and follow-up as scheduled.     Rehab Potential Excellent   Clinical Impairments Affecting Rehab Potential Positive: motivation, age, support; Negative: chronicity of pain, extensive surgery   PT Frequency 2x / week   PT Duration 8 weeks   PT Treatment/Interventions ADLs/Self Care Home Management;Aquatic Therapy;Cryotherapy;Electrical Stimulation;Moist Heat;Iontophoresis 4mg /ml Dexamethasone;Ultrasound;Fluidtherapy;Contrast Bath;DME Instruction;Gait training;Stair training;Functional mobility training;Therapeutic activities;Therapeutic exercise;Balance training;Neuromuscular re-education;Patient/family education;Manual techniques;Scar mobilization;Passive range of motion   PT Next Visit Plan theraband resisted ankle 4 ways (add to HEP if pt can tolerate), continue AROM/PROM, scar massage   PT Home Exercise Plan L ankle 4 way isometrics <20% of MVC, ankle circles, ankle alphabets, AROM L ankle inversion/eversion, gentle towel gastroc stretch, L ankle yellow tband resisted ankle inversion/eversion, L ankle red tband resisted ankle plantarflexion/dorsiflexion   Consulted and Agree with Plan of Care Patient      Patient will benefit from skilled therapeutic intervention in order to  improve the following deficits and impairments:  Abnormal gait, Decreased balance, Decreased range of motion, Decreased strength, Hypomobility, Impaired flexibility, Pain  Visit Diagnosis: Stiffness of left ankle, not elsewhere classified  Muscle weakness (generalized)     Problem List Patient Active Problem List   Diagnosis Date Noted  . Thoracic back pain 08/18/2016  . Sinus congestion 12/22/2015  . Varicose veins 11/17/2015  . Menopausal symptoms 11/17/2015  . Hearing loss 05/25/2015  . Bilateral thigh pain 11/17/2014  . Obstructive sleep apnea 06/27/2013  . Microscopic hematuria 06/27/2013  . Renal colic 38/33/3832  . Routine general medical examination at a health care facility 05/15/2013  . Goiter, nontoxic, multinodular 02/25/2013  . Acquired flat foot 11/20/2012  . Obesity 11/12/2012  . Hypertension 05/09/2012   Phillips Grout PT, DPT   [name] 03/23/2017, 10:20 AM  [place] [facility] [address], [place], [address] Phone: [phone]   Fax:  [phone]  Name: [name] MRN: [id number] Date of Birth: 1971-04-04

## 2017-03-27 ENCOUNTER — Ambulatory Visit: Payer: BLUE CROSS/BLUE SHIELD

## 2017-03-27 DIAGNOSIS — M6281 Muscle weakness (generalized): Secondary | ICD-10-CM | POA: Diagnosis not present

## 2017-03-27 DIAGNOSIS — M25672 Stiffness of left ankle, not elsewhere classified: Secondary | ICD-10-CM | POA: Diagnosis not present

## 2017-03-27 NOTE — Therapy (Signed)
[facility] [address] [place], [address] Phone: [phone]   Fax:  [phone]  Physical Therapy Treatment  Patient Details  Name: [name] MRN: [id number] Date of Birth: Sep 07, 1971 Referring Provider: Moses Manners PA-C  Encounter Date: 03/27/2017      PT End of Session - 03/27/17 1146    Visit Number 6   Number of Visits 17   Date for PT Re-Evaluation 05/02/17   Authorization Type no g codes   PT Start Time 1125   PT Stop Time 1210   PT Time Calculation (min) 45 min   Activity Tolerance Patient tolerated treatment well   Behavior During Therapy [facility] for tasks assessed/performed      Past Medical History:  Diagnosis Date  . Blood in urine 2014  . Breast mass 2013  . BV (bacterial vaginosis)   . Edema    legs  . Hypertension   . Thyroid disease     Past Surgical History:  Procedure Laterality Date  . BREAST BIOPSY Right 2013   benign - fatty tissue  . BREAST BIOPSY Left 06-12-13   fibroadenoma /core biopsy  . BREAST BIOPSY Right 06-17-13   fibrocystic /stereo biopsy  . CESAREAN SECTION  2007  . DILATION AND CURETTAGE OF UTERUS    . FOOT SURGERY Left    01/2017  . ROUX-EN-Y PROCEDURE  03/04/2014   Dr. Saint Lucia  . TUBAL LIGATION      There were no vitals filed for this visit.      Subjective Assessment - 03/27/17 1137    Subjective Pt reports she is doing well today. No pain at this time. She had her follow-up appointment with her MD and he is pleased with her progress. She has no specific questions or concerns at this time.    Pertinent History Pt reports chronic L foot pain and swelling as well as plantar fasciitis. She was initially scheduled to have surgery at [facility] a couple years ago but "chickened out." On 01/20/17 she underwent medial displacement calcaneal osteotomy left foot, debridement and repair of posterior tibial tendon left foot, repair spring ligament, transfer flexor  digitorum longus tendon to navicular, and Strayer procedure to lengthen Achilles tendon. She reports no complications after surgery. She was non-weightbearing for the first 6 weeks. She went back to see her orthopedic surgeon Thursday 03/02/17 and was placed in a post-op boot that she has been using since that time. She doesn't recall any current weightbearing restrictions and has been WBAT since that time with lofstrand crutches. Order received from surgeon for physical therapy states PWB progressing to FWB. Order is for AROM/PROM and strengthening. No further protocol or restrictions provided at this time. Pt denies any injury since surgery. She reports some problems with healing of the incision on the posterior calcaneus.    Patient Stated Goals Return to prior function with less pain   Currently in Pain? No/denies           TREATMENT   Manual Therapy:  PROM into L ankle inversion, eversion, PF, DF with very light gentle stretch x 5 each direction with 5 second holds  Light massage distal>proximal starting at toes and up past ankle for decreased swelling as effusion noted throughout L foot and ankle x 3 minutes.    Therapeutic Exercise:  L ankle alphabets 1 round; Ankle circles x20 clockwise/counterclockwise in supine  Seated L DF, PF, inversion, and eversion with GTB resistance for PF/DF  and YTB for inversion/eversion 3s hold. x10 each direction (progressed GTB for HEP);  Attempted standing single leg balance, pt struggles to perform due to increase in pain and soreness so started with standing weight shifts. Able to perform to 5-10s bouts of L single leg balance with UE support x 8 bouts (added SLS to HEP); Standing L ankle gentle gastroc stretch 30s hold x 3 (added to HEP)                    PT Education - 03/27/17 1144    Education provided Yes   Education Details HEP reinforced and progressed   Person(s) Educated Patient   Methods Explanation   Comprehension  Verbalized understanding             PT Long Term Goals - 03/08/17 1133      PT LONG TERM GOAL #1   Title Pt will be independent with HEP in order to improve L ankle/foot strength, mobility, and proprioception in order to decrease pain and improve function at home and work.    Time 8   Period Weeks   Status New     PT LONG TERM GOAL #2   Title Pt will increase LEFS by at least 9 points in order to demonstrate significant improvement in lower extremity function.    Baseline 03/07/17: 26/80   Time 8   Period Weeks   Status New     PT LONG TERM GOAL #3   Title Pt will improve L ankle strength for dorsiflexion, plantarflexion, inversion, and eversion to at least 4/5   Baseline 03/07/17: unable to test   Time 8   Period Weeks   Status New     PT LONG TERM GOAL #4   Title Pt will improve L ankle dorsiflexion to at least 10 degrees past neutral in order to improve normal gait mechanics and stair ascend/descent   Baseline 03/07/17: -10 degrees   Time 8   Period Weeks   Status New               Plan - 03/27/17 1150    Clinical Impression Statement Pt continues to make good progress with therapy. She reports some increased soreness and lack of confidence when standing on LLE in single leg stance with UE support. This was added to her HEP to encouraged full weight acceptance for normalizing gait pattern out of walking boot. HEP progressed at this time. Pt encouraged to follow-up as scheduled.    Rehab Potential Excellent   Clinical Impairments Affecting Rehab Potential Positive: motivation, age, support; Negative: chronicity of pain, extensive surgery   PT Frequency 2x / week   PT Duration 8 weeks   PT Treatment/Interventions ADLs/Self Care Home Management;Aquatic Therapy;Cryotherapy;Electrical Stimulation;Moist Heat;Iontophoresis 4mg /ml Dexamethasone;Ultrasound;Fluidtherapy;Contrast Bath;DME Instruction;Gait training;Stair training;Functional mobility training;Therapeutic  activities;Therapeutic exercise;Balance training;Neuromuscular re-education;Patient/family education;Manual techniques;Scar mobilization;Passive range of motion   PT Next Visit Plan theraband resisted ankle 4 ways (add to HEP if pt can tolerate), continue AROM/PROM, scar massage   PT Home Exercise Plan standing gastroc stretch out of boot, gentle towel gastroc stretch, L ankle yellow tband resisted ankle inversion/eversion (progress to red when able), L ankle green tband resisted ankle plantarflexion/dorsiflexion, standing L single leg balance with UE support out of walking boot.    Consulted and Agree with Plan of Care Patient      Patient will benefit from skilled therapeutic intervention in order to improve the following deficits and impairments:  Abnormal gait, Decreased balance, Decreased  range of motion, Decreased strength, Hypomobility, Impaired flexibility, Pain  Visit Diagnosis: Stiffness of left ankle, not elsewhere classified  Muscle weakness (generalized)     Problem List Patient Active Problem List   Diagnosis Date Noted  . Thoracic back pain 08/18/2016  . Sinus congestion 12/22/2015  . Varicose veins 11/17/2015  . Menopausal symptoms 11/17/2015  . Hearing loss 05/25/2015  . Bilateral thigh pain 11/17/2014  . Obstructive sleep apnea 06/27/2013  . Microscopic hematuria 06/27/2013  . Renal colic 76/72/0947  . Routine general medical examination at a health care facility 05/15/2013  . Goiter, nontoxic, multinodular 02/25/2013  . Acquired flat foot 11/20/2012  . Obesity 11/12/2012  . Hypertension 05/09/2012   Phillips Grout PT, DPT   Azhane Eckart 03/27/2017, 1:21 PM  [facility] [address], [place], [address] Phone: [phone]   Fax:  [phone]  Name: [name] MRN: [id number] Date of Birth: Apr 27, 1971

## 2017-03-28 ENCOUNTER — Telehealth: Payer: Self-pay | Admitting: Obstetrics and Gynecology

## 2017-03-28 ENCOUNTER — Encounter: Payer: BLUE CROSS/BLUE SHIELD | Admitting: Physical Therapy

## 2017-03-29 ENCOUNTER — Ambulatory Visit: Payer: BLUE CROSS/BLUE SHIELD

## 2017-03-29 DIAGNOSIS — M6281 Muscle weakness (generalized): Secondary | ICD-10-CM

## 2017-03-29 DIAGNOSIS — M25672 Stiffness of left ankle, not elsewhere classified: Secondary | ICD-10-CM

## 2017-03-29 NOTE — Therapy (Signed)
[facility] [address], [place], [address] Phone: [phone]   Fax:  [phone]  Physical Therapy Treatment  Patient Details  Name: [name] MRN: [id number] Date of Birth: 04/07/1971 Referring Provider: Moses Manners PA-C  Encounter Date: 03/29/2017      PT End of Session - 03/29/17 1627    Visit Number 7   Number of Visits 17   Date for PT Re-Evaluation 05/02/17   Authorization Type no g codes   PT Start Time 1623-03-21   PT Stop Time 1710   PT Time Calculation (min) 46 min   Activity Tolerance Patient tolerated treatment well   Behavior During Therapy [facility] for tasks assessed/performed      Past Medical History:  Diagnosis Date  . Blood in urine 03-20-13  . Breast mass 03/20/12  . BV (bacterial vaginosis)   . Edema    legs  . Hypertension   . Thyroid disease     Past Surgical History:  Procedure Laterality Date  . BREAST BIOPSY Right 20-Mar-2012   benign - fatty tissue  . BREAST BIOPSY Left 06-12-13   fibroadenoma /core biopsy  . BREAST BIOPSY Right 06-17-13   fibrocystic /stereo biopsy  . CESAREAN SECTION  03-20-06  . DILATION AND CURETTAGE OF UTERUS    . FOOT SURGERY Left    01/2017  . ROUX-EN-Y PROCEDURE  03/04/2014   Dr. Saint Lucia  . TUBAL LIGATION      There were no vitals filed for this visit.      Subjective Assessment - 03/29/17 1625    Subjective Pt reports she is doing well at this time. No pain currently. Reports that she has only tried to walk out of her boot for approximately 30 minutes since her last therapy session.    Pertinent History Pt reports chronic L foot pain and swelling as well as plantar fasciitis. She was initially scheduled to have surgery at [facility] a couple years ago but "chickened out." On 01/20/17 she underwent medial displacement calcaneal osteotomy left foot, debridement and repair of posterior tibial tendon left foot, repair spring ligament, transfer flexor digitorum longus  tendon to navicular, and Strayer procedure to lengthen Achilles tendon. She reports no complications after surgery. She was non-weightbearing for the first 6 weeks. She went back to see her orthopedic surgeon Thursday 03/02/17 and was placed in a post-op boot that she has been using since that time. She doesn't recall any current weightbearing restrictions and has been WBAT since that time with lofstrand crutches. Order received from surgeon for physical therapy states PWB progressing to FWB. Order is for AROM/PROM and strengthening. No further protocol or restrictions provided at this time. Pt denies any injury since surgery. She reports some problems with healing of the incision on the posterior calcaneus.    Patient Stated Goals Return to prior function with less pain   Currently in Pain? No/denies         TREATMENT   Manual Therapy:  PROM into L ankle inversion, eversion, PF, DF with very light gentle stretch x 5 each direction with 5 second holds  Grade I L ankle talocrural distraction and AP mobilizations (for dorsiflexion and pain control) 30s/bout x 3 bouts each;  Therapeutic Exercise:  Ankle circles x20 clockwise/counterclockwise in supine  Seated L DF, PF, inversion, and eversionwith GTB resistance for PF/DF and YTB for inversion/eversion 3s hold. 2 x 10 each direction; Seated BAPS Level 3 forward/backwards x 20 Seated  BAPS Level 3 inversion/eversion x 20, challenging for pt to control, ball between knees to avoid hip rotation; Seated BAPS Level 3 with CW and CCW rotations x 20 each; Standing L ankle gentle gastroc stretch 30s hold x 3; Standing L ankle gentle soleus stretch 30s hold x 3 (added to HEP); Standing weight shifts to promote L SLS x 2 minutes; Standing L single leg balance in 10-15s increments to improve WB through LLE; Standing bilateral heel raises x 10, pain-free (added to HEP); Gait in gym without boot with cues to increased R step length and increase time in  SLS. Pt gradually improves as distance progresses, no pain reported;                          PT Education - 03/29/17 1627    Education provided Yes   Education Details HEP reinforced   Person(s) Educated Patient   Methods Explanation   Comprehension Verbalized understanding             PT Long Term Goals - 03/08/17 1133      PT LONG TERM GOAL #1   Title Pt will be independent with HEP in order to improve L ankle/foot strength, mobility, and proprioception in order to decrease pain and improve function at home and work.    Time 8   Period Weeks   Status New     PT LONG TERM GOAL #2   Title Pt will increase LEFS by at least 9 points in order to demonstrate significant improvement in lower extremity function.    Baseline 03/07/17: 26/80   Time 8   Period Weeks   Status New     PT LONG TERM GOAL #3   Title Pt will improve L ankle strength for dorsiflexion, plantarflexion, inversion, and eversion to at least 4/5   Baseline 03/07/17: unable to test   Time 8   Period Weeks   Status New     PT LONG TERM GOAL #4   Title Pt will improve L ankle dorsiflexion to at least 10 degrees past neutral in order to improve normal gait mechanics and stair ascend/descent   Baseline 03/07/17: -10 degrees   Time 8   Period Weeks   Status New               Plan - 03/29/17 1627    Clinical Impression Statement Pt continues to make progress with her L ankle strength. She is very timid about walking out of her boot. Encouraged her to walk out of her boot and discussed appropriate wean times. Pt is able to perform some bilateral heel raises today with minimal discomfort. Encouraged pt to continue HEP and follow-up as scheduled.    Rehab Potential Excellent   Clinical Impairments Affecting Rehab Potential Positive: motivation, age, support; Negative: chronicity of pain, extensive surgery   PT Frequency 2x / week   PT Duration 8 weeks   PT Treatment/Interventions  ADLs/Self Care Home Management;Aquatic Therapy;Cryotherapy;Electrical Stimulation;Moist Heat;Iontophoresis 4mg /ml Dexamethasone;Ultrasound;Fluidtherapy;Contrast Bath;DME Instruction;Gait training;Stair training;Functional mobility training;Therapeutic activities;Therapeutic exercise;Balance training;Neuromuscular re-education;Patient/family education;Manual techniques;Scar mobilization;Passive range of motion   PT Next Visit Plan Ankle strengthening, standing squats, continue AROM/PROM, scar massage and manual techniques as appropriate   PT Home Exercise Plan standing gastroc stretch out of boot, standing soleus stretch out of boot, L ankle yellow tband resisted ankle inversion/eversion (progress to red band as able), L ankle green tband resisted ankle plantarflexion/dorsiflexion, standing L single leg balance with UE  support out of walking boot, standing bilateral heel raises   Consulted and Agree with Plan of Care Patient      Patient will benefit from skilled therapeutic intervention in order to improve the following deficits and impairments:  Abnormal gait, Decreased balance, Decreased range of motion, Decreased strength, Hypomobility, Impaired flexibility, Pain  Visit Diagnosis: Stiffness of left ankle, not elsewhere classified  Muscle weakness (generalized)     Problem List Patient Active Problem List   Diagnosis Date Noted  . Thoracic back pain 08/18/2016  . Sinus congestion 12/22/2015  . Varicose veins 11/17/2015  . Menopausal symptoms 11/17/2015  . Hearing loss 05/25/2015  . Bilateral thigh pain 11/17/2014  . Obstructive sleep apnea 06/27/2013  . Microscopic hematuria 06/27/2013  . Renal colic 37/48/2707  . Routine general medical examination at a health care facility 05/15/2013  . Goiter, nontoxic, multinodular 02/25/2013  . Acquired flat foot 11/20/2012  . Obesity 11/12/2012  . Hypertension 05/09/2012   Phillips Grout PT, DPT   Huprich,Jason 03/30/2017, 9:15 AM  [facility] [address] [place], [address] Phone: [phone]   Fax:  [phone]  Name: TINESHIA BECRAFT MRN: [id number] Date of Birth: 07/23/1971

## 2017-03-30 ENCOUNTER — Encounter: Payer: BLUE CROSS/BLUE SHIELD | Admitting: Physical Therapy

## 2017-03-30 ENCOUNTER — Encounter: Payer: Self-pay | Admitting: Obstetrics and Gynecology

## 2017-03-30 ENCOUNTER — Ambulatory Visit (INDEPENDENT_AMBULATORY_CARE_PROVIDER_SITE_OTHER): Payer: BLUE CROSS/BLUE SHIELD | Admitting: Obstetrics and Gynecology

## 2017-03-30 VITALS — BP 109/69 | HR 88 | Ht 66.0 in | Wt 257.2 lb

## 2017-03-30 DIAGNOSIS — Z9851 Tubal ligation status: Secondary | ICD-10-CM

## 2017-03-30 DIAGNOSIS — N939 Abnormal uterine and vaginal bleeding, unspecified: Secondary | ICD-10-CM | POA: Insufficient documentation

## 2017-03-30 DIAGNOSIS — N921 Excessive and frequent menstruation with irregular cycle: Secondary | ICD-10-CM | POA: Insufficient documentation

## 2017-03-30 DIAGNOSIS — Z90710 Acquired absence of both cervix and uterus: Secondary | ICD-10-CM | POA: Insufficient documentation

## 2017-03-30 DIAGNOSIS — N951 Menopausal and female climacteric states: Secondary | ICD-10-CM | POA: Insufficient documentation

## 2017-03-30 MED ORDER — NORETHINDRONE ACETATE 5 MG PO TABS: 15.0000 mg | ORAL_TABLET | Freq: Every day | ORAL | 0 refills | Status: DC

## 2017-03-30 NOTE — Patient Instructions (Addendum)
1. Endometrial biopsy is performed today 2. Pelvic ultrasound is scheduled 3. Take norethindrone acetate 15 mg a day for the next 30 days 4. Return in 1 week after ultrasound for review of results and further management planning 5. Literature on endometrial ablation is given   Endometrial Biopsy, Care After This sheet gives you information about how to care for yourself after your procedure. Your health care provider may also give you more specific instructions. If you have problems or questions, contact your health care provider. What can I expect after the procedure? After the procedure, it is common to have:  Mild cramping.  A small amount of vaginal bleeding for a few days. This is normal. Follow these instructions at home:  Take over-the-counter and prescription medicines only as told by your health care provider.  Do not douche, use tampons, or have sexual intercourse until your health care provider approves.  Return to your normal activities as told by your health care provider. Ask your health care provider what activities are safe for you.  Follow instructions from your health care provider about any activity restrictions, such as restrictions on strenuous exercise or heavy lifting. Contact a health care provider if:  You have heavy bleeding, or bleed for longer than 2 days after the procedure.  You have bad smelling discharge from your vagina.  You have a fever or chills.  You have a burning sensation when urinating or you have difficulty urinating.  You have severe pain in your lower abdomen. Get help right away if:  You have severe cramps in your stomach or back.  You pass large blood clots.  Your bleeding increases.  You become weak or light-headed, or you pass out. Summary  After the procedure, it is common to have mild cramping and a small amount of vaginal bleeding for a few days.  Do not douche, use tampons, or have sexual intercourse until your health  care provider approves.  Return to your normal activities as told by your health care provider. Ask your health care provider what activities are safe for you. This information is not intended to replace advice given to you by your health care provider. Make sure you discuss any questions you have with your health care provider. Document Released: 09/25/2013 Document Revised: 12/21/2016 Document Reviewed: 12/21/2016 Elsevier Interactive Patient Education  2017 [employer].  Endometrial Ablation Endometrial ablation is a procedure that destroys the thin inner layer of the lining of the uterus (endometrium). This procedure may be done:  To stop heavy periods.  To stop bleeding that is causing anemia.  To control irregular bleeding.  To treat bleeding caused by small tumors (fibroids) in the endometrium. This procedure is often an alternative to major surgery, such as removal of the uterus and cervix (hysterectomy). As a result of this procedure:  You may not be able to have children. However, if you are premenopausal (you have not gone through menopause):  You may still have a small chance of getting pregnant.  You will need to use a reliable method of birth control after the procedure to prevent pregnancy.  You may stop having a menstrual period, or you may have only a small amount of bleeding during your period. Menstruation may return several years after the procedure. Tell a health care provider about:  Any allergies you have.  All medicines you are taking, including vitamins, herbs, eye drops, creams, and over-the-counter medicines.  Any problems you or family members have had with the use  of anesthetic medicines.  Any blood disorders you have.  Any surgeries you have had.  Any medical conditions you have. What are the risks? Generally, this is a safe procedure. However, problems may occur, including:  A hole (perforation) in the uterus or bowel.  Infection of the  uterus, bladder, or vagina.  Bleeding.  Damage to other structures or organs.  An air bubble in the lung (air embolus).  Problems with pregnancy after the procedure.  Failure of the procedure.  Decreased ability to diagnose cancer in the endometrium. What happens before the procedure?  You will have tests of your endometrium to make sure there are no pre-cancerous cells or cancer cells present.  You may have an ultrasound of the uterus.  You may be given medicines to thin the endometrium.  Ask your health care provider about:  Changing or stopping your regular medicines. This is especially important if you take diabetes medicines or blood thinners.  Taking medicines such as aspirin and ibuprofen. These medicines can thin your blood. Do not take these medicines before your procedure if your doctor tells you not to.  Plan to have someone take you home from the hospital or clinic. What happens during the procedure?  You will lie on an exam table with your feet and legs supported as in a pelvic exam.  To lower your risk of infection:  Your health care team will wash or sanitize their hands and put on germ-free (sterile) gloves.  Your genital area will be washed with soap.  An IV tube will be inserted into one of your veins.  You will be given a medicine to help you relax (sedative).  A surgical instrument with a light and camera (resectoscope) will be inserted into your vagina and moved into your uterus. This allows your surgeon to see inside your uterus.  Endometrial tissue will be removed using one of the following methods:  Radiofrequency. This method uses a radiofrequency-alternating electric current to remove the endometrium.  Cryotherapy. This method uses extreme cold to freeze the endometrium.  Heated-free liquid. This method uses a heated saltwater (saline) solution to remove the endometrium.  Microwave. This method uses high-energy microwaves to heat up the  endometrium and remove it.  Thermal balloon. This method involves inserting a catheter with a balloon tip into the uterus. The balloon tip is filled with heated fluid to remove the endometrium. The procedure may vary among health care providers and hospitals. What happens after the procedure?  Your blood pressure, heart rate, breathing rate, and blood oxygen level will be monitored until the medicines you were given have worn off.  As tissue healing occurs, you may notice vaginal bleeding for 4-6 weeks after the procedure. You may also experience:  Cramps.  Thin, watery vaginal discharge that is light pink or brown in color.  A need to urinate more frequently than usual.  Nausea.  Do not drive for 24 hours if you were given a sedative.  Do not have sex or insert anything into your vagina until your health care provider approves. Summary  Endometrial ablation is done to treat the many causes of heavy menstrual bleeding.  The procedure may be done only after medications have been tried to control the bleeding.  Plan to have someone take you home from the hospital or clinic. This information is not intended to replace advice given to you by your health care provider. Make sure you discuss any questions you have with your health care provider. Document  Released: 10/14/2004 Document Revised: 12/22/2016 Document Reviewed: 12/22/2016 Elsevier Interactive Patient Education  2017 [employer].

## 2017-03-30 NOTE — Progress Notes (Addendum)
GYN ENCOUNTER NOTE  Subjective:       [name] is a 46 y.o. [phone] female is here for gynecologic evaluation of the following issues:  1. Prolonged vaginal bleeding  Perimenopausal female with irregular menstrual cycles ranging anywhere from 21-28 day intervals, presents for evaluation of chronic ongoing bleeding of 13 days duration, not associated with significant pelvic pain. Patient is status post tubal ligation the past. Patient did have episode of menorrhagia prior to her last childbirth which was treated with D&C.Marland Kitchen     Gynecologic History Patient's last menstrual period was 03/18/2017 (approximate). Contraception: tubal ligation  Obstetric History OB History  Gravida Para Term Preterm AB Living  3 2 2   1 2   SAB TAB Ectopic Multiple Live Births  1       2    # Outcome Date GA Lbr Len/2nd Weight Sex Delivery Anes PTL Lv  3 Term 2007   9 lb 1.8 oz (4.132 kg) M CS-LTranv   LIV  2 SAB 2005          1 Term 1999   8 lb 8 oz (3.856 kg) F VBAC   LIV      Past Medical History:  Diagnosis Date  . Blood in urine 2014  . Breast mass 2013  . BV (bacterial vaginosis)   . Edema    legs  . Hypertension   . Thyroid disease     Past Surgical History:  Procedure Laterality Date  . BREAST BIOPSY Right 2013   benign - fatty tissue  . BREAST BIOPSY Left 06-12-13   fibroadenoma /core biopsy  . BREAST BIOPSY Right 06-17-13   fibrocystic /stereo biopsy  . CESAREAN SECTION  2007  . DILATION AND CURETTAGE OF UTERUS    . FOOT SURGERY Left    01/2017  . ROUX-EN-Y PROCEDURE  03/04/2014   Dr. Saint Lucia  . TUBAL LIGATION      Current Outpatient Prescriptions on File Prior to Visit  Medication Sig Dispense Refill  . acetaminophen (TYLENOL) 500 MG tablet Take 500 mg by mouth every 6 (six) hours as needed.    . hydrochlorothiazide (HYDRODIURIL) 25 MG tablet Take 1 tablet (25 mg total) by mouth daily. 90 tablet 3   No current facility-administered medications on file prior to visit.      Allergies  Allergen Reactions  . Tape Other (See Comments)    Blistering, ok with paper tape  . Avelox [Moxifloxacin Hcl In Nacl] Palpitations    Social History   Social History  . Marital status: Married    Spouse name: N/A  . Number of children: N/A  . Years of education: N/A   Occupational History  . Not on file.   Social History Main Topics  . Smoking status: Former Research scientist (life sciences)  . Smokeless tobacco: Never Used     Comment: quit 2006  . Alcohol use Yes     Comment: rare  . Drug use: No  . Sexual activity: Yes    Birth control/ protection: Surgical   Other Topics Concern  . Not on file   Social History Narrative   Married.   Two children- daughter and son. Daughter at [employer] and thinking about medical profession.           Family History  Problem Relation Age of Onset  . Arthritis Mother   . Diabetes Father   . Hypertension Father   . Polycystic ovary syndrome Sister   . Cancer Maternal Grandmother  breast  . Breast cancer Neg Hx   . Ovarian cancer Neg Hx     The following portions of the patient's history were reviewed and updated as appropriate: allergies, current medications, past family history, past medical history, past social history, past surgical history and problem list.  Review of Systems Review of Systems  Constitutional: Negative for chills, fever, malaise/fatigue and weight loss.       Occasional vasomotor symptoms  Respiratory: Negative.   Cardiovascular: Negative.   Gastrointestinal: Negative.   Genitourinary: Negative.        Bleeding for 13 days  Musculoskeletal: Negative.   Skin: Negative.   Neurological: Negative.  Negative for weakness.  Endo/Heme/Allergies: Negative.   Psychiatric/Behavioral: Negative.     Objective:   BP 109/69   Pulse 88   Ht 5\' 6"  (1.676 m)   Wt 257 lb 3.2 oz (116.7 kg)   LMP 03/18/2017 (Approximate)   BMI 41.51 kg/m  CONSTITUTIONAL: Well-developed, well-nourished female in no acute distress.   HENT:  Normocephalic, atraumatic.  NECK: Not examined SKIN: Skin is warm and dry. No rash noted. Not diaphoretic. No erythema. No pallor. [place]: Alert and oriented to person, place, and time. PSYCHIATRIC: Normal mood and affect. Normal behavior. Normal judgment and thought content. CARDIOVASCULAR:Not Examined RESPIRATORY: Not Examined BREASTS: Not Examined ABDOMEN: Soft, non distended; Non tender.  No Organomegaly. PELVIC:  External Genitalia: Normal  BUS: Normal  Vagina: Normal; menstrual blood in vault  Cervix: Normal; no lesions; no cervical motion tenderness  Uterus: Normal size, shape,consistency, mobile, anterior, nontender  Adnexa: Normal; nonpalpable and nontender  RV: Normal external exam  Bladder: Nontender MUSCULOSKELETAL: Normal range of motion. No tenderness.  No cyanosis, clubbing, or edema.  PROCEDURE:Endometrial Biopsy Procedure Note  Pre-operative Diagnosis: Menorrhagia  Post-operative Diagnosis: Menorrhagia  Procedure Details   Urine pregnancy test was not done.  The risks (including infection, bleeding, pain, and uterine perforation) and benefits of the procedure were explained to the patient and Verbal informed consent was obtained.  Antibiotic prophylaxis against endocarditis was not indicated.   The patient was placed in the dorsal lithotomy position.  Bimanual exam showed the uterus to be in the anteroflexed position.  A Graves' speculum inserted in the vagina.  Endocervical curettage with a Kevorkian curette was not performed.   A sharp tenaculum was applied to the anterior lip of the cervix for stabilization.  A 3 mm Mylex curette was used to sound the uterus to a depth of 9cm.  A Mylex 75mm curette was used to sample the endometrium.  Sample was sent for pathologic examination.  Condition: Stable  Complications: None  Plan:  The patient was advised to call for any fever or for prolonged or severe pain or bleeding. She was advised to use OTC  acetaminophen and OTC ibuprofen as needed for mild to moderate pain. She was advised to avoid vaginal intercourse for 48 hours or until the bleeding has completely stopped.  Attending Physician Documentation: Brayton Mars, MD    Assessment:   1. Abnormal uterine bleeding (AUB) - Pathology - US Pelvis Complete; Future - US Transvaginal Non-OB; Future  2. Climacteric  3. Menorrhagia with irregular cycle - US Pelvis Complete; Future - US Transvaginal Non-OB; Future  4. Status post tubal ligation     Plan:   1. Endometrial biopsy 2. Pelvic ultrasound 3. Start norethindrone acetate 15 mg a day for the next 30 days 4. Return in 1 week after ultrasound for further management planning and review of  results 5. Literature on endometrial ablations given  A total of 15 minutes were spent face-to-face with the patient during this encounter and over half of that time dealt with counseling and coordination of care.  Brayton Mars, MD  Note: This dictation was prepared with Dragon dictation along with smaller phrase technology. Any transcriptional errors that result from this process are unintentional.

## 2017-04-03 ENCOUNTER — Ambulatory Visit: Payer: BLUE CROSS/BLUE SHIELD

## 2017-04-03 DIAGNOSIS — M6281 Muscle weakness (generalized): Secondary | ICD-10-CM

## 2017-04-03 DIAGNOSIS — M25672 Stiffness of left ankle, not elsewhere classified: Secondary | ICD-10-CM

## 2017-04-03 LAB — PATHOLOGY

## 2017-04-03 NOTE — Therapy (Signed)
[facility] [address] [place], [address] Phone: [phone]   Fax:  [phone]  Physical Therapy Treatment  Patient Details  Name: [name] MRN: [id number] Date of Birth: [birth year] Referring Provider: Moses Manners PA-C  Encounter Date: 04/03/2017      PT End of Session - 04/03/17 1454    Visit Number 8   Number of Visits 17   Date for PT Re-Evaluation 05/02/17   Authorization Type no g codes   PT Start Time 1450   PT Stop Time 1535   PT Time Calculation (min) 45 min   Activity Tolerance Patient tolerated treatment well   Behavior During Therapy [facility] for tasks assessed/performed      Past Medical History:  Diagnosis Date  . Blood in urine 2014  . Breast mass 2013  . BV (bacterial vaginosis)   . Edema    legs  . Hypertension   . Thyroid disease     Past Surgical History:  Procedure Laterality Date  . BREAST BIOPSY Right 2013   benign - fatty tissue  . BREAST BIOPSY Left 06-12-13   fibroadenoma /core biopsy  . BREAST BIOPSY Right 06-17-13   fibrocystic /stereo biopsy  . CESAREAN SECTION  2007  . DILATION AND CURETTAGE OF UTERUS    . FOOT SURGERY Left    01/2017  . ROUX-EN-Y PROCEDURE  03/04/2014   Dr. Saint Lucia  . TUBAL LIGATION      There were no vitals filed for this visit.      Subjective Assessment - 04/03/17 1451    Subjective Pt reports that she spent a considerable amount of time out of her walking boot over the weekend. She arrives today in her lace-up ankle brace with a running shoe. Denies pain at this time.    Pertinent History Pt reports chronic L foot pain and swelling as well as plantar fasciitis. She was initially scheduled to have surgery at [facility] a couple years ago but "chickened out." On 01/20/17 she underwent medial displacement calcaneal osteotomy left foot, debridement and repair of posterior tibial tendon left foot, repair spring ligament, transfer flexor  digitorum longus tendon to navicular, and Strayer procedure to lengthen Achilles tendon. She reports no complications after surgery. She was non-weightbearing for the first 6 weeks. She went back to see her orthopedic surgeon Thursday 03/02/17 and was placed in a post-op boot that she has been using since that time. She doesn't recall any current weightbearing restrictions and has been WBAT since that time with lofstrand crutches. Order received from surgeon for physical therapy states PWB progressing to FWB. Order is for AROM/PROM and strengthening. No further protocol or restrictions provided at this time. Pt denies any injury since surgery. She reports some problems with healing of the incision on the posterior calcaneus.    Patient Stated Goals Return to prior function with less pain   Currently in Pain? No/denies                TREATMENT   Manual Therapy:  PROM into L ankle inversion, eversion, PF, DF with very light gentle stretch x 5 each direction with 5second holds   Therapeutic Exercise:  Ankle circles x 20 clockwise/counterclockwise, PF/DF, and one round of ABC in longsitting;  Seated L inversion and eversionwith RTB resistance 3s hold 2 x 10 each direction; Standing Prostretch bilateral ankle stretch 30s hold x 3, with 30s rest break between; Standing L ankle gentle soleus stretch  30s hold x 3 (added to HEP); Standing L single leg balance on Airex in 10-15s increments to improve WB through LLE; Total Gym bilateral squats L20 x 10, pt able to perform with ease; Total Gym L single leg squats L15 2 x 10; Total Gym L single leg heel raises L10 2 x 10; Forward and then L lateral step-ups to 6" step 2 x 10 each; Gait in gym without boot with cues to increased R step length and increase time in SLS. Pt gradually improves as distance progresses, no pain reported; Cold pack applied to L ankle at end of session x 5 minutes (unbilled);                    PT  Education - 04/03/17 1454    Education provided Yes   Education Details HEP reinforced   Person(s) Educated Patient   Methods Explanation   Comprehension Verbalized understanding             PT Long Term Goals - 03/08/17 1133      PT LONG TERM GOAL #1   Title Pt will be independent with HEP in order to improve L ankle/foot strength, mobility, and proprioception in order to decrease pain and improve function at home and work.    Time 8   Period Weeks   Status New     PT LONG TERM GOAL #2   Title Pt will increase LEFS by at least 9 points in order to demonstrate significant improvement in lower extremity function.    Baseline 03/07/17: 26/80   Time 8   Period Weeks   Status New     PT LONG TERM GOAL #3   Title Pt will improve L ankle strength for dorsiflexion, plantarflexion, inversion, and eversion to at least 4/5   Baseline 03/07/17: unable to test   Time 8   Period Weeks   Status New     PT LONG TERM GOAL #4   Title Pt will improve L ankle dorsiflexion to at least 10 degrees past neutral in order to improve normal gait mechanics and stair ascend/descent   Baseline 03/07/17: -10 degrees   Time 8   Period Weeks   Status New               Plan - 04/03/17 1454    Clinical Impression Statement Pt making excellent progress with therapy on this date. She demonstrates normalizing gait pattern with improved step length symmetry following cues. Single leg balance is impaired on compliant surfaces. Pt encouraged to continue HEP with focus on increasing LLE WB during heel raises. Advised to continue to increase time out of walking boot.    Rehab Potential Excellent   Clinical Impairments Affecting Rehab Potential Positive: motivation, age, support; Negative: chronicity of pain, extensive surgery   PT Frequency 2x / week   PT Duration 8 weeks   PT Treatment/Interventions ADLs/Self Care Home Management;Aquatic Therapy;Cryotherapy;Electrical Stimulation;Moist  Heat;Iontophoresis 4mg /ml Dexamethasone;Ultrasound;Fluidtherapy;Contrast Bath;DME Instruction;Gait training;Stair training;Functional mobility training;Therapeutic activities;Therapeutic exercise;Balance training;Neuromuscular re-education;Patient/family education;Manual techniques;Scar mobilization;Passive range of motion   PT Next Visit Plan Ankle strengthening, standing squats, continue AROM/PROM, scar massage and manual techniques as appropriate   PT Home Exercise Plan standing gastroc stretch out of boot, standing soleus stretch out of boot, L ankle yellow tband resisted ankle inversion/eversion (progress to red band as able), L ankle green tband resisted ankle plantarflexion/dorsiflexion, standing L single leg balance with UE support out of walking boot, standing bilateral heel raises with gradually  increasing weight on LLE   Consulted and Agree with Plan of Care Patient      Patient will benefit from skilled therapeutic intervention in order to improve the following deficits and impairments:  Abnormal gait, Decreased balance, Decreased range of motion, Decreased strength, Hypomobility, Impaired flexibility, Pain  Visit Diagnosis: Stiffness of left ankle, not elsewhere classified  Muscle weakness (generalized)     Problem List Patient Active Problem List   Diagnosis Date Noted  . Menorrhagia with irregular cycle 03/30/2017  . Climacteric 03/30/2017  . Abnormal uterine bleeding (AUB) 03/30/2017  . Thoracic back pain 08/18/2016  . Sinus congestion 12/22/2015  . Varicose veins 11/17/2015  . Hearing loss 05/25/2015  . Bilateral thigh pain 11/17/2014  . Status post tubal ligation 09/22/2014  . Obstructive sleep apnea 06/27/2013  . Microscopic hematuria 06/27/2013  . Renal colic 32/41/9914  . Routine general medical examination at a health care facility 05/15/2013  . Goiter, nontoxic, multinodular 02/25/2013  . Acquired flat foot 11/20/2012  . Obesity 11/12/2012  . Hypertension  05/09/2012   Phillips Grout PT, DPT   Huprich,Jason 04/03/2017, 5:02 PM  [facility] [address], [place], [address] Phone: [phone]   Fax:  [phone]  Name: MADELEIN MAHADEO MRN: [id number] Date of Birth: 1971-07-31

## 2017-04-04 ENCOUNTER — Encounter: Payer: BLUE CROSS/BLUE SHIELD | Admitting: Physical Therapy

## 2017-04-05 ENCOUNTER — Ambulatory Visit: Payer: BLUE CROSS/BLUE SHIELD

## 2017-04-05 DIAGNOSIS — M25672 Stiffness of left ankle, not elsewhere classified: Secondary | ICD-10-CM

## 2017-04-05 DIAGNOSIS — M6281 Muscle weakness (generalized): Secondary | ICD-10-CM

## 2017-04-05 NOTE — Therapy (Signed)
[facility] [address], [place], [address] Phone: [phone]   Fax:  [phone]  Physical Therapy Treatment  Patient Details  Name: [name] MRN: [id number] Date of Birth: August 01, 1971 Referring Provider: Moses Manners PA-C  Encounter Date: 04/05/2017      PT End of Session - 04/05/17 1634    Visit Number 9   Number of Visits 17   Date for PT Re-Evaluation 05/02/17   Authorization Type no g codes   PT Start Time 1340   PT Stop Time 1425   PT Time Calculation (min) 45 min   Activity Tolerance Patient tolerated treatment well   Behavior During Therapy [facility] for tasks assessed/performed      Past Medical History:  Diagnosis Date  . Blood in urine 2014  . Breast mass 2013  . BV (bacterial vaginosis)   . Edema    legs  . Hypertension   . Thyroid disease     Past Surgical History:  Procedure Laterality Date  . BREAST BIOPSY Right 2013   benign - fatty tissue  . BREAST BIOPSY Left 06-12-13   fibroadenoma /core biopsy  . BREAST BIOPSY Right 06-17-13   fibrocystic /stereo biopsy  . CESAREAN SECTION  2007  . DILATION AND CURETTAGE OF UTERUS    . FOOT SURGERY Left    01/2017  . ROUX-EN-Y PROCEDURE  03/04/2014   Dr. Saint Lucia  . TUBAL LIGATION      There were no vitals filed for this visit.      Subjective Assessment - 04/05/17 1540    Subjective Pt reports she has had some increased soreness in her ankle since her last therapy session. She also reports that she went into her work yesterday which likely contributed to the soreness. No specific questions at this time.    Pertinent History Pt reports chronic L foot pain and swelling as well as plantar fasciitis. She was initially scheduled to have surgery at [facility] a couple years ago but "chickened out." On 01/20/17 she underwent medial displacement calcaneal osteotomy left foot, debridement and repair of posterior tibial tendon left foot, repair  spring ligament, transfer flexor digitorum longus tendon to navicular, and Strayer procedure to lengthen Achilles tendon. She reports no complications after surgery. She was non-weightbearing for the first 6 weeks. She went back to see her orthopedic surgeon Thursday 03/02/17 and was placed in a post-op boot that she has been using since that time. She doesn't recall any current weightbearing restrictions and has been WBAT since that time with lofstrand crutches. Order received from surgeon for physical therapy states PWB progressing to FWB. Order is for AROM/PROM and strengthening. No further protocol or restrictions provided at this time. Pt denies any injury since surgery. She reports some problems with healing of the incision on the posterior calcaneus.    Patient Stated Goals Return to prior function with less pain   Currently in Pain? Yes   Pain Score 2    Pain Location Ankle   Pain Orientation Left   Pain Descriptors / Indicators Sore   Pain Type Surgical pain   Pain Onset More than a month ago   Pain Frequency Intermittent             TREATMENT   Manual Therapy:  PROM into L ankle inversion, eversion, PF, DF with very light gentle stretch x 5 each direction with 5 second holds  Extensive light massage distal>proximal starting at toes and  up past ankle for decreased swelling as effusion noted throughout L foot and ankle; Scar massage to medial L foot/ankle;  Therapeutic Exercise:  Ankle circles x 20 clockwise/counterclockwise, PF/DF, and one round of ABC in longsitting;  Seated L inversion and eversionwith RTB resistance 3s hold 2x 10 each direction; Standing Prostretch bilateral ankle stretch 30s hold x 3, with 30s rest break between; Total Gym L single leg squats L18 2 x 10; Total Gym L single leg heel raises L12 2 x 10; Standing bilateral heel raises 2 x 10;                      PT Education - 04/05/17 1633    Education provided Yes   Education  Details HEP reinforced   Person(s) Educated Patient   Methods Explanation   Comprehension Verbalized understanding             PT Long Term Goals - 03/08/17 1133      PT LONG TERM GOAL #1   Title Pt will be independent with HEP in order to improve L ankle/foot strength, mobility, and proprioception in order to decrease pain and improve function at home and work.    Time 8   Period Weeks   Status New     PT LONG TERM GOAL #2   Title Pt will increase LEFS by at least 9 points in order to demonstrate significant improvement in lower extremity function.    Baseline 03/07/17: 26/80   Time 8   Period Weeks   Status New     PT LONG TERM GOAL #3   Title Pt will improve L ankle strength for dorsiflexion, plantarflexion, inversion, and eversion to at least 4/5   Baseline 03/07/17: unable to test   Time 8   Period Weeks   Status New     PT LONG TERM GOAL #4   Title Pt will improve L ankle dorsiflexion to at least 10 degrees past neutral in order to improve normal gait mechanics and stair ascend/descent   Baseline 03/07/17: -10 degrees   Time 8   Period Weeks   Status New               Plan - 04/05/17 1635    Clinical Impression Statement Pt continues to make progress with therapy. Decreased exercise intensity today due to soreness following last therapy session. Decreased swelling noted following STM to L foot/ankle. Pt encouraged to continue current HEP and follow-up as scheduled.    Rehab Potential Excellent   Clinical Impairments Affecting Rehab Potential Positive: motivation, age, support; Negative: chronicity of pain, extensive surgery   PT Frequency 2x / week   PT Duration 8 weeks   PT Treatment/Interventions ADLs/Self Care Home Management;Aquatic Therapy;Cryotherapy;Electrical Stimulation;Moist Heat;Iontophoresis 4mg /ml Dexamethasone;Ultrasound;Fluidtherapy;Contrast Bath;DME Instruction;Gait training;Stair training;Functional mobility training;Therapeutic  activities;Therapeutic exercise;Balance training;Neuromuscular re-education;Patient/family education;Manual techniques;Scar mobilization;Passive range of motion   PT Next Visit Plan Ankle strengthening, standing squats, continue AROM/PROM, scar massage and manual techniques as appropriate   PT Home Exercise Plan standing gastroc stretch out of boot, standing soleus stretch out of boot, L ankle yellow tband resisted ankle inversion/eversion (progress to red band as able), L ankle green tband resisted ankle plantarflexion/dorsiflexion, standing L single leg balance with UE support out of walking boot, standing bilateral heel raises with gradually increasing weight on LLE   Consulted and Agree with Plan of Care Patient      Patient will benefit from skilled therapeutic intervention in order to improve the  following deficits and impairments:  Abnormal gait, Decreased balance, Decreased range of motion, Decreased strength, Hypomobility, Impaired flexibility, Pain  Visit Diagnosis: Stiffness of left ankle, not elsewhere classified  Muscle weakness (generalized)     Problem List Patient Active Problem List   Diagnosis Date Noted  . Menorrhagia with irregular cycle 03/30/2017  . Climacteric 03/30/2017  . Abnormal uterine bleeding (AUB) 03/30/2017  . Thoracic back pain 08/18/2016  . Sinus congestion 12/22/2015  . Varicose veins 11/17/2015  . Hearing loss 05/25/2015  . Bilateral thigh pain 11/17/2014  . Status post tubal ligation 09/22/2014  . Obstructive sleep apnea 06/27/2013  . Microscopic hematuria 06/27/2013  . Renal colic 16/12/930  . Routine general medical examination at a health care facility 05/15/2013  . Goiter, nontoxic, multinodular 02/25/2013  . Acquired flat foot 11/20/2012  . Obesity 11/12/2012  . Hypertension 05/09/2012   Phillips Grout PT, DPT   Huprich,Jason 04/05/2017, 4:38 PM  [facility] [address] [place], [address] Phone: [phone]   Fax:  [phone]  Name: [name] MRN: [id number] Date of Birth: [birth year]

## 2017-04-06 ENCOUNTER — Ambulatory Visit (INDEPENDENT_AMBULATORY_CARE_PROVIDER_SITE_OTHER): Payer: BLUE CROSS/BLUE SHIELD

## 2017-04-06 ENCOUNTER — Encounter: Payer: BLUE CROSS/BLUE SHIELD | Admitting: Physical Therapy

## 2017-04-06 DIAGNOSIS — N921 Excessive and frequent menstruation with irregular cycle: Secondary | ICD-10-CM

## 2017-04-06 DIAGNOSIS — N939 Abnormal uterine and vaginal bleeding, unspecified: Secondary | ICD-10-CM | POA: Diagnosis not present

## 2017-04-06 NOTE — Telephone Encounter (Signed)
ERROR

## 2017-04-10 ENCOUNTER — Ambulatory Visit: Payer: BLUE CROSS/BLUE SHIELD

## 2017-04-10 DIAGNOSIS — M25672 Stiffness of left ankle, not elsewhere classified: Secondary | ICD-10-CM | POA: Diagnosis not present

## 2017-04-10 DIAGNOSIS — M6281 Muscle weakness (generalized): Secondary | ICD-10-CM | POA: Diagnosis not present

## 2017-04-10 NOTE — Therapy (Signed)
[facility] [address], [place], [address] Phone: [phone]   Fax:  [phone]  Physical Therapy Treatment  Patient Details  Name: [name] MRN: [id number] Date of Birth: 1971-07-05 Referring Provider: Moses Manners PA-C  Encounter Date: 04/10/2017      PT End of Session - 04/10/17 1602    Visit Number 10   Number of Visits 17   Date for PT Re-Evaluation 05/02/17   Authorization Type no g codes   PT Start Time 5176   PT Stop Time 1615   PT Time Calculation (min) 38 min   Activity Tolerance Patient tolerated treatment well   Behavior During Therapy [facility] for tasks assessed/performed      Past Medical History:  Diagnosis Date  . Blood in urine 2014  . Breast mass 2013  . BV (bacterial vaginosis)   . Edema    legs  . Hypertension   . Thyroid disease     Past Surgical History:  Procedure Laterality Date  . BREAST BIOPSY Right 2013   benign - fatty tissue  . BREAST BIOPSY Left 06-12-13   fibroadenoma /core biopsy  . BREAST BIOPSY Right 06-17-13   fibrocystic /stereo biopsy  . CESAREAN SECTION  2007  . DILATION AND CURETTAGE OF UTERUS    . FOOT SURGERY Left    01/2017  . ROUX-EN-Y PROCEDURE  03/04/2014   Dr. Saint Lucia  . TUBAL LIGATION      There were no vitals filed for this visit.      Subjective Assessment - 04/10/17 1558    Subjective Pt reports she has had increased swelling in the foot/ankle. Patient states she doesn't know if she should be weening out of the foot.    Pertinent History Pt reports chronic L foot pain and swelling as well as plantar fasciitis. She was initially scheduled to have surgery at [facility] a couple years ago but "chickened out." On 01/20/17 she underwent medial displacement calcaneal osteotomy left foot, debridement and repair of posterior tibial tendon left foot, repair spring ligament, transfer flexor digitorum longus tendon to navicular, and Strayer procedure  to lengthen Achilles tendon. She reports no complications after surgery. She was non-weightbearing for the first 6 weeks. She went back to see her orthopedic surgeon Thursday 03/02/17 and was placed in a post-op boot that she has been using since that time. She doesn't recall any current weightbearing restrictions and has been WBAT since that time with lofstrand crutches. Order received from surgeon for physical therapy states PWB progressing to FWB. Order is for AROM/PROM and strengthening. No further protocol or restrictions provided at this time. Pt denies any injury since surgery. She reports some problems with healing of the incision on the posterior calcaneus.    Patient Stated Goals Return to prior function with less pain   Currently in Pain? No/denies   Pain Onset More than a month ago      TREATMENT    Manual Therapy:  Extensive light massage distal>proximal starting at toes and up past ankle for decreased swelling as effusion noted throughout L foot and ankle; Scar massage to medial L foot/ankle;   Therapeutic Exercise:  Standing bilateral heel raises on airex pad -- 2 x 10; Ankle circles x 20 clockwise/counterclockwise, PF/DF, and one round of ABC in longsitting;  BAPS board level 2 - inversion/eversion; ant/post, circular cw/ccw - 1 min in each direction Standing marches on airex pad - x 20  Monster walks  with RTB - 3 x 28ft B        PT Education - 04/10/17 1601    Education provided Yes   Education Details Educated on symptoms of swelling and improving swelling   Person(s) Educated Patient   Methods Explanation;Demonstration   Comprehension Verbalized understanding;Returned demonstration             PT Long Term Goals - 03/08/17 1133      PT LONG TERM GOAL #1   Title Pt will be independent with HEP in order to improve L ankle/foot strength, mobility, and proprioception in order to decrease pain and improve function at home and work.    Time 8   Period Weeks    Status New     PT LONG TERM GOAL #2   Title Pt will increase LEFS by at least 9 points in order to demonstrate significant improvement in lower extremity function.    Baseline 03/07/17: 26/80   Time 8   Period Weeks   Status New     PT LONG TERM GOAL #3   Title Pt will improve L ankle strength for dorsiflexion, plantarflexion, inversion, and eversion to at least 4/5   Baseline 03/07/17: unable to test   Time 8   Period Weeks   Status New     PT LONG TERM GOAL #4   Title Pt will improve L ankle dorsiflexion to at least 10 degrees past neutral in order to improve normal gait mechanics and stair ascend/descent   Baseline 03/07/17: -10 degrees   Time 8   Period Weeks   Status New               Plan - 04/10/17 1708    Clinical Impression Statement Patient demonstrates decreased ankle coordination and endurance with exercises with noted fatigue after the session. Patient demonstrates increased edema throughout the foot/ankle and educated patient to perform more ankle elevation and compression throughout the day to address this impairment. Patient will benefit from further skilled therapy focused on improving limitations to return to prior level of function.    Rehab Potential Excellent   Clinical Impairments Affecting Rehab Potential Positive: motivation, age, support; Negative: chronicity of pain, extensive surgery   PT Frequency 2x / week   PT Duration 8 weeks   PT Treatment/Interventions ADLs/Self Care Home Management;Aquatic Therapy;Cryotherapy;Electrical Stimulation;Moist Heat;Iontophoresis 4mg /ml Dexamethasone;Ultrasound;Fluidtherapy;Contrast Bath;DME Instruction;Gait training;Stair training;Functional mobility training;Therapeutic activities;Therapeutic exercise;Balance training;Neuromuscular re-education;Patient/family education;Manual techniques;Scar mobilization;Passive range of motion   PT Next Visit Plan Ankle strengthening, standing squats, continue AROM/PROM, scar massage  and manual techniques as appropriate   PT Home Exercise Plan standing gastroc stretch out of boot, standing soleus stretch out of boot, L ankle yellow tband resisted ankle inversion/eversion (progress to red band as able), L ankle green tband resisted ankle plantarflexion/dorsiflexion, standing L single leg balance with UE support out of walking boot, standing bilateral heel raises with gradually increasing weight on LLE   Consulted and Agree with Plan of Care Patient      Patient will benefit from skilled therapeutic intervention in order to improve the following deficits and impairments:  Abnormal gait, Decreased balance, Decreased range of motion, Decreased strength, Hypomobility, Impaired flexibility, Pain  Visit Diagnosis: Muscle weakness (generalized)  Stiffness of left ankle, not elsewhere classified     Problem List Patient Active Problem List   Diagnosis Date Noted  . Menorrhagia with irregular cycle 03/30/2017  . Climacteric 03/30/2017  . Abnormal uterine bleeding (AUB) 03/30/2017  . Thoracic back pain 08/18/2016  .  Sinus congestion 12/22/2015  . Varicose veins 11/17/2015  . Hearing loss 05/25/2015  . Bilateral thigh pain 11/17/2014  . Status post tubal ligation 09/22/2014  . Obstructive sleep apnea 06/27/2013  . Microscopic hematuria 06/27/2013  . Renal colic 02/16/3142  . Routine general medical examination at a health care facility 05/15/2013  . Goiter, nontoxic, multinodular 02/25/2013  . Acquired flat foot 11/20/2012  . Obesity 11/12/2012  . Hypertension 05/09/2012    Blythe Stanford, PT DPT 04/10/2017, 5:12 PM  [facility] [address], [place], [address] Phone: [phone]   Fax:  [phone]  Name: [name] MRN: [id number] Date of Birth: 1971-10-22

## 2017-04-12 ENCOUNTER — Ambulatory Visit: Payer: BLUE CROSS/BLUE SHIELD

## 2017-04-12 DIAGNOSIS — M6281 Muscle weakness (generalized): Secondary | ICD-10-CM

## 2017-04-12 DIAGNOSIS — M25672 Stiffness of left ankle, not elsewhere classified: Secondary | ICD-10-CM | POA: Diagnosis not present

## 2017-04-12 NOTE — Therapy (Signed)
[facility] [address], [place], [address] Phone: [phone]   Fax:  [phone]  Physical Therapy Treatment  Patient Details  Name: [name] MRN: [id number] Date of Birth: 03-22-1971 Referring Provider: Moses Manners PA-C  Encounter Date: 04/12/2017      PT End of Session - 04/12/17 1652    Visit Number 11   Number of Visits 17   Date for PT Re-Evaluation 05/02/17   Authorization Type no g codes   PT Start Time 03-28-1621   PT Stop Time 1705   PT Time Calculation (min) 43 min   Activity Tolerance Patient tolerated treatment well   Behavior During Therapy [facility] for tasks assessed/performed      Past Medical History:  Diagnosis Date  . Blood in urine 28-Mar-2013  . Breast mass 03-28-12  . BV (bacterial vaginosis)   . Edema    legs  . Hypertension   . Thyroid disease     Past Surgical History:  Procedure Laterality Date  . BREAST BIOPSY Right 28-Mar-2012   benign - fatty tissue  . BREAST BIOPSY Left 06-12-13   fibroadenoma /core biopsy  . BREAST BIOPSY Right 06-17-13   fibrocystic /stereo biopsy  . CESAREAN SECTION  03-28-06  . DILATION AND CURETTAGE OF UTERUS    . FOOT SURGERY Left    01/2017  . ROUX-EN-Y PROCEDURE  03/04/2014   Dr. Saint Lucia  . TUBAL LIGATION      There were no vitals filed for this visit.      Subjective Assessment - 04/12/17 1644    Subjective Patient returns and reports she's been performing elevation and icing of the ankle to help decrease swelling. Patient reports no current pain in the ankle.    Pertinent History Pt reports chronic L foot pain and swelling as well as plantar fasciitis. She was initially scheduled to have surgery at [facility] a couple years ago but "chickened out." On 01/20/17 she underwent medial displacement calcaneal osteotomy left foot, debridement and repair of posterior tibial tendon left foot, repair spring ligament, transfer flexor digitorum longus tendon to navicular,  and Strayer procedure to lengthen Achilles tendon. She reports no complications after surgery. She was non-weightbearing for the first 6 weeks. She went back to see her orthopedic surgeon Thursday 03/02/17 and was placed in a post-op boot that she has been using since that time. She doesn't recall any current weightbearing restrictions and has been WBAT since that time with lofstrand crutches. Order received from surgeon for physical therapy states PWB progressing to FWB. Order is for AROM/PROM and strengthening. No further protocol or restrictions provided at this time. Pt denies any injury since surgery. She reports some problems with healing of the incision on the posterior calcaneus.    Patient Stated Goals Return to prior function with less pain   Currently in Pain? No/denies   Pain Onset More than a month ago        TREATMENT    Manual Therapy:  Extensive light massage distal>proximal starting at toes and up past ankle for decreased swelling as effusion noted throughout L foot and ankle; Therapeutic Exercise:  Standing bilateral heel raises - x20; Standing marches on Bosu ball  - x 20  Side stepping up and over Bosu ball - x 20 with light UE support  Large dynadisc in standing ant/post, lateral weight shifts, circular motion - x 30sec each direction Total gym single leg stance with foot on an airex  pad for quad contraction - 3 x 15 BAPS board level 1 in standing - inversion/eversion; ant/post, circular cw/ccw - 1 min in each direction Standing hip abduction in standing - x 15 with UE support on L stance only Hip machine - 55# B x15 lateral leg lifts        PT Education - 04/12/17 1652    Education provided Yes   Education Details form/technique with exercise   Person(s) Educated Patient   Methods Explanation;Demonstration   Comprehension Verbalized understanding;Returned demonstration             PT Long Term Goals - 03/08/17 1133      PT LONG TERM GOAL #1   Title Pt  will be independent with HEP in order to improve L ankle/foot strength, mobility, and proprioception in order to decrease pain and improve function at home and work.    Time 8   Period Weeks   Status New     PT LONG TERM GOAL #2   Title Pt will increase LEFS by at least 9 points in order to demonstrate significant improvement in lower extremity function.    Baseline 03/07/17: 26/80   Time 8   Period Weeks   Status New     PT LONG TERM GOAL #3   Title Pt will improve L ankle strength for dorsiflexion, plantarflexion, inversion, and eversion to at least 4/5   Baseline 03/07/17: unable to test   Time 8   Period Weeks   Status New     PT LONG TERM GOAL #4   Title Pt will improve L ankle dorsiflexion to at least 10 degrees past neutral in order to improve normal gait mechanics and stair ascend/descent   Baseline 03/07/17: -10 degrees   Time 8   Period Weeks   Status New               Plan - 04/12/17 1655    Clinical Impression Statement Performed manual therapy to decreased increased swellling in the ankle; patient demonstrates decreased swelling after performing. Focused on improving anke mobility in standing to improve muscular coordination and endurance; patient will benefit from further skilled therapy to return to prior level of function.     Rehab Potential Excellent   Clinical Impairments Affecting Rehab Potential Positive: motivation, age, support; Negative: chronicity of pain, extensive surgery   PT Frequency 2x / week   PT Duration 8 weeks   PT Treatment/Interventions ADLs/Self Care Home Management;Aquatic Therapy;Cryotherapy;Electrical Stimulation;Moist Heat;Iontophoresis 4mg /ml Dexamethasone;Ultrasound;Fluidtherapy;Contrast Bath;DME Instruction;Gait training;Stair training;Functional mobility training;Therapeutic activities;Therapeutic exercise;Balance training;Neuromuscular re-education;Patient/family education;Manual techniques;Scar mobilization;Passive range of motion    PT Next Visit Plan Ankle strengthening, standing squats, continue AROM/PROM, scar massage and manual techniques as appropriate   PT Home Exercise Plan standing gastroc stretch out of boot, standing soleus stretch out of boot, L ankle yellow tband resisted ankle inversion/eversion (progress to red band as able), L ankle green tband resisted ankle plantarflexion/dorsiflexion, standing L single leg balance with UE support out of walking boot, standing bilateral heel raises with gradually increasing weight on LLE   Consulted and Agree with Plan of Care Patient      Patient will benefit from skilled therapeutic intervention in order to improve the following deficits and impairments:  Abnormal gait, Decreased balance, Decreased range of motion, Decreased strength, Hypomobility, Impaired flexibility, Pain  Visit Diagnosis: Muscle weakness (generalized)  Stiffness of left ankle, not elsewhere classified     Problem List Patient Active Problem List   Diagnosis Date Noted  .  Menorrhagia with irregular cycle 03/30/2017  . Climacteric 03/30/2017  . Abnormal uterine bleeding (AUB) 03/30/2017  . Thoracic back pain 08/18/2016  . Sinus congestion 12/22/2015  . Varicose veins 11/17/2015  . Hearing loss 05/25/2015  . Bilateral thigh pain 11/17/2014  . Status post tubal ligation 09/22/2014  . Obstructive sleep apnea 06/27/2013  . Microscopic hematuria 06/27/2013  . Renal colic 82/51/8984  . Routine general medical examination at a health care facility 05/15/2013  . Goiter, nontoxic, multinodular 02/25/2013  . Acquired flat foot 11/20/2012  . Obesity 11/12/2012  . Hypertension 05/09/2012    Blythe Stanford, PT DPT 04/12/2017, 5:12 PM  [facility] [address] [place], [address] Phone: [phone]   Fax:  [phone]  Name: [name] MRN: [id number] Date of Birth: 07/31/1971

## 2017-04-13 ENCOUNTER — Encounter: Payer: BLUE CROSS/BLUE SHIELD | Admitting: Obstetrics and Gynecology

## 2017-04-13 DIAGNOSIS — M79671 Pain in right foot: Secondary | ICD-10-CM | POA: Diagnosis not present

## 2017-04-13 DIAGNOSIS — M2142 Flat foot [pes planus] (acquired), left foot: Secondary | ICD-10-CM | POA: Diagnosis not present

## 2017-04-13 DIAGNOSIS — M2141 Flat foot [pes planus] (acquired), right foot: Secondary | ICD-10-CM | POA: Diagnosis not present

## 2017-04-13 DIAGNOSIS — M76822 Posterior tibial tendinitis, left leg: Secondary | ICD-10-CM | POA: Diagnosis not present

## 2017-04-14 ENCOUNTER — Encounter: Payer: BLUE CROSS/BLUE SHIELD | Admitting: Obstetrics and Gynecology

## 2017-04-18 ENCOUNTER — Ambulatory Visit: Payer: BLUE CROSS/BLUE SHIELD | Attending: Physician Assistant

## 2017-04-18 ENCOUNTER — Encounter: Payer: Self-pay | Admitting: Obstetrics and Gynecology

## 2017-04-18 ENCOUNTER — Ambulatory Visit (INDEPENDENT_AMBULATORY_CARE_PROVIDER_SITE_OTHER): Payer: BLUE CROSS/BLUE SHIELD | Admitting: Obstetrics and Gynecology

## 2017-04-18 DIAGNOSIS — M25672 Stiffness of left ankle, not elsewhere classified: Secondary | ICD-10-CM

## 2017-04-18 DIAGNOSIS — N921 Excessive and frequent menstruation with irregular cycle: Secondary | ICD-10-CM | POA: Diagnosis not present

## 2017-04-18 DIAGNOSIS — M6281 Muscle weakness (generalized): Secondary | ICD-10-CM | POA: Diagnosis not present

## 2017-04-18 MED ORDER — NORETHINDRONE ACETATE 5 MG PO TABS: 15.0000 mg | ORAL_TABLET | Freq: Every day | ORAL | 0 refills | Status: DC

## 2017-04-18 NOTE — Progress Notes (Signed)
Chief complaint: 1. Menorrhagia with irregular cycle 2. Follow-up on ultrasound and endometrial biopsy  Patient presents for follow-up. She is currently taking norethindrone acetate 50 mg a day; bleeding has subsided.  Endometrial biopsy-proliferative endometrium without hyperplasia or carcinoma Pelvic ultrasound-normal uterus: Endometrial stripe 3.8 mm  OBJECTIVE: BP 112/67   Pulse 68   Ht 5\' 6"  (1.676 m)   Wt 250 lb (113.4 kg)   LMP 03/17/2017 (Exact Date)   BMI 40.35 kg/m  Pleasant female in no acute distress Pelvic exam-deferred  ASSESSMENT: 1. Menorrhagia with irregular cycle 2. Benign endometrial biopsy 3. Normal pelvic ultrasound  PLAN: 1. Continue norethindrone acetate 50 mg a day for another month 2. Maintain menstrual calendar monitoring 3. Return in 3 months for annual exam and follow-up on abnormal uterine bleeding  A total of 15 minutes were spent face-to-face with the patient during this encounter and over half of that time dealt with counseling and coordination of care.  Brayton Mars, MD  Note: This dictation was prepared with Dragon dictation along with smaller phrase technology. Any transcriptional errors that result from this process are unintentional.

## 2017-04-18 NOTE — Therapy (Signed)
[facility] [address] [place], [address] Phone: [phone]   Fax:  [phone]  Physical Therapy Treatment  Patient Details  Name: [name] MRN: [id number] Date of Birth: [birth year] Referring Provider: Moses Manners PA-C  Encounter Date: 04/18/2017      PT End of Session - 04/18/17 1652    Visit Number 12   Number of Visits 17   Date for PT Re-Evaluation 05/02/17   Authorization Type no g codes   PT Start Time 04-05-19   PT Stop Time 1705   PT Time Calculation (min) 45 min   Activity Tolerance Patient tolerated treatment well   Behavior During Therapy [facility] for tasks assessed/performed      Past Medical History:  Diagnosis Date  . Blood in urine 04-Apr-2013  . Breast mass Apr 04, 2012  . BV (bacterial vaginosis)   . Edema    legs  . Hypertension   . Thyroid disease     Past Surgical History:  Procedure Laterality Date  . BREAST BIOPSY Right April 04, 2012   benign - fatty tissue  . BREAST BIOPSY Left 06-12-13   fibroadenoma /core biopsy  . BREAST BIOPSY Right 06-17-13   fibrocystic /stereo biopsy  . CESAREAN SECTION  April 04, 2006  . DILATION AND CURETTAGE OF UTERUS    . FOOT SURGERY Left    01/2017  . ROUX-EN-Y PROCEDURE  03/04/2014   Dr. Saint Lucia  . TUBAL LIGATION      There were no vitals filed for this visit.      Subjective Assessment - 04/18/17 1638    Subjective Patient reports she conitnues to have increased swelling in the foot and ankle but is not experiencing pain.    Pertinent History Pt reports chronic L foot pain and swelling as well as plantar fasciitis. She was initially scheduled to have surgery at [facility] a couple years ago but "chickened out." On 01/20/17 she underwent medial displacement calcaneal osteotomy left foot, debridement and repair of posterior tibial tendon left foot, repair spring ligament, transfer flexor digitorum longus tendon to navicular, and Strayer procedure to lengthen Achilles  tendon. She reports no complications after surgery. She was non-weightbearing for the first 6 weeks. She went back to see her orthopedic surgeon Thursday 03/02/17 and was placed in a post-op boot that she has been using since that time. She doesn't recall any current weightbearing restrictions and has been WBAT since that time with lofstrand crutches. Order received from surgeon for physical therapy states PWB progressing to FWB. Order is for AROM/PROM and strengthening. No further protocol or restrictions provided at this time. Pt denies any injury since surgery. She reports some problems with healing of the incision on the posterior calcaneus.    Patient Stated Goals Return to prior function with less pain   Currently in Pain? No/denies   Pain Onset More than a month ago      TREATMENT    Manual Therapy:  Extensive light massage distal>proximal starting at toes and up past ankle for decreased swelling as effusion noted throughout L foot and ankle;  Therapeutic Exercise:  Standing bilateral heel raises - 2 x 20 on airex pad  Single leg Squats on Total gym at 12 level - 2 x 20 Large dynadisc in standing ant/post, lateral weight shifts,- 2x 30sec each direction Marches on two large dynadiscs - x 20  Forward/backward weight shifts with lifting the foot on 2 large dynadisc - x 20 each direction Rotational step  ups over Bosu ball - x 20 with light UE support  Hip machine - 55# B 2x15 lateral leg lifts Patient responds well to therapy with no increase in pain throughout treatment       PT Education - 04/18/17 1652    Education provided Yes   Education Details Form/technique with exercise   Person(s) Educated Patient   Methods Explanation;Demonstration   Comprehension Verbalized understanding;Returned demonstration             PT Long Term Goals - 03/08/17 1133      PT LONG TERM GOAL #1   Title Pt will be independent with HEP in order to improve L ankle/foot strength, mobility, and  proprioception in order to decrease pain and improve function at home and work.    Time 8   Period Weeks   Status New     PT LONG TERM GOAL #2   Title Pt will increase LEFS by at least 9 points in order to demonstrate significant improvement in lower extremity function.    Baseline 03/07/17: 26/80   Time 8   Period Weeks   Status New     PT LONG TERM GOAL #3   Title Pt will improve L ankle strength for dorsiflexion, plantarflexion, inversion, and eversion to at least 4/5   Baseline 03/07/17: unable to test   Time 8   Period Weeks   Status New     PT LONG TERM GOAL #4   Title Pt will improve L ankle dorsiflexion to at least 10 degrees past neutral in order to improve normal gait mechanics and stair ascend/descent   Baseline 03/07/17: -10 degrees   Time 8   Period Weeks   Status New               Plan - 04/18/17 1653    Clinical Impression Statement Patient demonstrates decreased ankle swelling after performing manual therapy indicating improved circulation. Patient demonstrates decreased single leg balance and strength along the affected ankle indicating decreased coordination and propioception. Patient will benefit from further skilled therapy and patient will benefit from further skilled therapy to return to prior level of function.   Rehab Potential Excellent   Clinical Impairments Affecting Rehab Potential Positive: motivation, age, support; Negative: chronicity of pain, extensive surgery   PT Frequency 2x / week   PT Duration 8 weeks   PT Treatment/Interventions ADLs/Self Care Home Management;Aquatic Therapy;Cryotherapy;Electrical Stimulation;Moist Heat;Iontophoresis 4mg /ml Dexamethasone;Ultrasound;Fluidtherapy;Contrast Bath;DME Instruction;Gait training;Stair training;Functional mobility training;Therapeutic activities;Therapeutic exercise;Balance training;Neuromuscular re-education;Patient/family education;Manual techniques;Scar mobilization;Passive range of motion   PT  Next Visit Plan Ankle strengthening, standing squats, continue AROM/PROM, scar massage and manual techniques as appropriate   PT Home Exercise Plan standing gastroc stretch out of boot, standing soleus stretch out of boot, L ankle yellow tband resisted ankle inversion/eversion (progress to red band as able), L ankle green tband resisted ankle plantarflexion/dorsiflexion, standing L single leg balance with UE support out of walking boot, standing bilateral heel raises with gradually increasing weight on LLE   Consulted and Agree with Plan of Care Patient      Patient will benefit from skilled therapeutic intervention in order to improve the following deficits and impairments:  Abnormal gait, Decreased balance, Decreased range of motion, Decreased strength, Hypomobility, Impaired flexibility, Pain  Visit Diagnosis: Muscle weakness (generalized)  Stiffness of left ankle, not elsewhere classified     Problem List Patient Active Problem List   Diagnosis Date Noted  . Menorrhagia with irregular cycle 03/30/2017  . Climacteric 03/30/2017  .  Abnormal uterine bleeding (AUB) 03/30/2017  . Thoracic back pain 08/18/2016  . Sinus congestion 12/22/2015  . Varicose veins 11/17/2015  . Hearing loss 05/25/2015  . Bilateral thigh pain 11/17/2014  . Status post tubal ligation 09/22/2014  . Obstructive sleep apnea 06/27/2013  . Microscopic hematuria 06/27/2013  . Renal colic 99/87/2158  . Routine general medical examination at a health care facility 05/15/2013  . Goiter, nontoxic, multinodular 02/25/2013  . Acquired flat foot 11/20/2012  . Obesity 11/12/2012  . Hypertension 05/09/2012    Blythe Stanford, PT DPT 04/18/2017, 5:01 PM  [facility] [address], [place], [address] Phone: [phone]   Fax:  [phone]  Name: [name] MRN: [id number] Date of Birth: 12/11/1971

## 2017-04-18 NOTE — Patient Instructions (Signed)
1. Continue norethindrone acetate 15 mg a day for another month 2. Begin estradiol or monitoring for abnormal uterine bleeding 3. Return in 3 months for annual exam and follow-up on abnormal uterine bleeding

## 2017-04-20 ENCOUNTER — Ambulatory Visit: Payer: BLUE CROSS/BLUE SHIELD

## 2017-04-20 ENCOUNTER — Encounter: Payer: BLUE CROSS/BLUE SHIELD | Admitting: Physical Therapy

## 2017-04-20 DIAGNOSIS — M25672 Stiffness of left ankle, not elsewhere classified: Secondary | ICD-10-CM

## 2017-04-20 DIAGNOSIS — M6281 Muscle weakness (generalized): Secondary | ICD-10-CM | POA: Diagnosis not present

## 2017-04-20 NOTE — Therapy (Signed)
[place] [facility] [address] [place], [address] Phone: [phone]   Fax:  [phone]  Physical Therapy Treatment  Patient Details  Name: [name] MRN: [id number] Date of Birth: 26-Jul-1971 Referring Provider: Moses Manners PA-C  Encounter Date: 04/20/2017      PT End of Session - 04/20/17 1709    Visit Number 13   Number of Visits 17   Date for PT Re-Evaluation 05/02/17   Authorization Type no g codes   PT Start Time 05-Apr-1619   PT Stop Time 1705   PT Time Calculation (min) 45 min   Activity Tolerance Patient tolerated treatment well   Behavior During Therapy [facility] for tasks assessed/performed      Past Medical History:  Diagnosis Date  . Blood in urine 04-04-2013  . Breast mass 04-04-2012  . BV (bacterial vaginosis)   . Edema    legs  . Hypertension   . Thyroid disease     Past Surgical History:  Procedure Laterality Date  . BREAST BIOPSY Right April 04, 2012   benign - fatty tissue  . BREAST BIOPSY Left 06-12-13   fibroadenoma /core biopsy  . BREAST BIOPSY Right 06-17-13   fibrocystic /stereo biopsy  . CESAREAN SECTION  04/04/2006  . DILATION AND CURETTAGE OF UTERUS    . FOOT SURGERY Left    01/2017  . ROUX-EN-Y PROCEDURE  03/04/2014   Dr. Saint Lucia  . TUBAL LIGATION      There were no vitals filed for this visit.      Subjective Assessment - 04/20/17 1706    Subjective Patient reports she continues to have swelling in the foot/ankle and reports increased pain throughout the day after walking around more than usual.    Pertinent History Pt reports chronic L foot pain and swelling as well as plantar fasciitis. She was initially scheduled to have surgery at [facility] a couple years ago but "chickened out." On 01/20/17 she underwent medial displacement calcaneal osteotomy left foot, debridement and repair of posterior tibial tendon left foot, repair spring ligament, transfer flexor digitorum longus tendon to navicular, and  Strayer procedure to lengthen Achilles tendon. She reports no complications after surgery. She was non-weightbearing for the first 6 weeks. She went back to see her orthopedic surgeon Thursday 03/02/17 and was placed in a post-op boot that she has been using since that time. She doesn't recall any current weightbearing restrictions and has been WBAT since that time with lofstrand crutches. Order received from surgeon for physical therapy states PWB progressing to FWB. Order is for AROM/PROM and strengthening. No further protocol or restrictions provided at this time. Pt denies any injury since surgery. She reports some problems with healing of the incision on the posterior calcaneus.    Patient Stated Goals Return to prior function with less pain   Currently in Pain? No/denies   Pain Onset More than a month ago        TREATMENT    Manual Therapy:  Extensive light massage distal>proximal starting at toes and up past ankle for decreased swelling as effusion noted throughout L foot and ankle;   Therapeutic Exercise:  Standing bilateral heel raises - 2 x 20 on airex pad  Single leg Squats on Total gym at 12 level -  x 20, level 14 x 20, x 20 with airex pad Forward/backward weight shifts on large dynadisc, lateral weight shifts - 2 x 30sec each direction Hip machine - 55# B 2x15 lateral  leg lifts Marches on bosu ball with blue side up - x 25  Ant/post, lateral weight shifts on black side bosu ball - x 25  Single leg balance on bosu ball (black side) - 30sec x 2        PT Education - 04/20/17 1707    Education provided Yes   Education Details educated on decreasing swelling & starting a walking program    Person(s) Educated Patient   Methods Explanation;Demonstration   Comprehension Verbalized understanding;Returned demonstration             PT Long Term Goals - 03/08/17 1133      PT LONG TERM GOAL #1   Title Pt will be independent with HEP in order to improve L ankle/foot  strength, mobility, and proprioception in order to decrease pain and improve function at home and work.    Time 8   Period Weeks   Status New     PT LONG TERM GOAL #2   Title Pt will increase LEFS by at least 9 points in order to demonstrate significant improvement in lower extremity function.    Baseline 03/07/17: 26/80   Time 8   Period Weeks   Status New     PT LONG TERM GOAL #3   Title Pt will improve L ankle strength for dorsiflexion, plantarflexion, inversion, and eversion to at least 4/5   Baseline 03/07/17: unable to test   Time 8   Period Weeks   Status New     PT LONG TERM GOAL #4   Title Pt will improve L ankle dorsiflexion to at least 10 degrees past neutral in order to improve normal gait mechanics and stair ascend/descent   Baseline 03/07/17: -10 degrees   Time 8   Period Weeks   Status New               Plan - 04/20/17 1710    Clinical Impression Statement Patient demonstrates increased ankle swelling today and focused on improving ankle stabilization in standing and decreasing swelling with manual therapy techniques. Patient demonstrates decreased pain after performing manual therapy indicating decreased swelling. Patient will benefit from further skilled therapy to return to prior level of function.     Rehab Potential Excellent   Clinical Impairments Affecting Rehab Potential Positive: motivation, age, support; Negative: chronicity of pain, extensive surgery   PT Frequency 2x / week   PT Duration 8 weeks   PT Treatment/Interventions ADLs/Self Care Home Management;Aquatic Therapy;Cryotherapy;Electrical Stimulation;Moist Heat;Iontophoresis 4mg /ml Dexamethasone;Ultrasound;Fluidtherapy;Contrast Bath;DME Instruction;Gait training;Stair training;Functional mobility training;Therapeutic activities;Therapeutic exercise;Balance training;Neuromuscular re-education;Patient/family education;Manual techniques;Scar mobilization;Passive range of motion   PT Next Visit Plan  Ankle strengthening, standing squats, continue AROM/PROM, scar massage and manual techniques as appropriate   PT Home Exercise Plan standing gastroc stretch out of boot, standing soleus stretch out of boot, L ankle yellow tband resisted ankle inversion/eversion (progress to red band as able), L ankle green tband resisted ankle plantarflexion/dorsiflexion, standing L single leg balance with UE support out of walking boot, standing bilateral heel raises with gradually increasing weight on LLE   Consulted and Agree with Plan of Care Patient      Patient will benefit from skilled therapeutic intervention in order to improve the following deficits and impairments:  Abnormal gait, Decreased balance, Decreased range of motion, Decreased strength, Hypomobility, Impaired flexibility, Pain, Impaired UE functional use  Visit Diagnosis: Muscle weakness (generalized)  Stiffness of left ankle, not elsewhere classified     Problem List Patient Active Problem List  Diagnosis Date Noted  . Menorrhagia with irregular cycle 03/30/2017  . Climacteric 03/30/2017  . Abnormal uterine bleeding (AUB) 03/30/2017  . Thoracic back pain 08/18/2016  . Sinus congestion 12/22/2015  . Varicose veins 11/17/2015  . Hearing loss 05/25/2015  . Bilateral thigh pain 11/17/2014  . Status post tubal ligation 09/22/2014  . Obstructive sleep apnea 06/27/2013  . Microscopic hematuria 06/27/2013  . Renal colic 78/97/8478  . Routine general medical examination at a health care facility 05/15/2013  . Goiter, nontoxic, multinodular 02/25/2013  . Acquired flat foot 11/20/2012  . Obesity 11/12/2012  . Hypertension 05/09/2012    Blythe Stanford, PT DPT 04/20/2017, 5:31 PM  [place] [facility] [address] [place], [address] Phone: [phone]   Fax:  [phone]  Name: CATHRINE KRIZAN MRN: [id number] Date of Birth: 12-Jun-1971

## 2017-04-25 ENCOUNTER — Ambulatory Visit: Payer: BLUE CROSS/BLUE SHIELD | Admitting: Physical Therapy

## 2017-04-25 ENCOUNTER — Encounter: Payer: Self-pay | Admitting: Physical Therapy

## 2017-04-25 DIAGNOSIS — M25672 Stiffness of left ankle, not elsewhere classified: Secondary | ICD-10-CM | POA: Diagnosis not present

## 2017-04-25 DIAGNOSIS — M6281 Muscle weakness (generalized): Secondary | ICD-10-CM

## 2017-04-25 NOTE — Therapy (Signed)
[place] [facility] [address], [place], [address] Phone: [phone]   Fax:  [phone]  Physical Therapy Treatment  Patient Details  Name: [name] MRN: [id number] Date of Birth: 05-17-1971 Referring Provider: Moses Manners PA-C  Encounter Date: 04/25/2017      PT End of Session - 04/25/17 1628    Visit Number 14   Number of Visits 17   Date for PT Re-Evaluation 05/02/17   Authorization Type no g codes   PT Start Time April 16, 1626   PT Stop Time 3382   PT Time Calculation (min) 31 min   Activity Tolerance Patient tolerated treatment well   Behavior During Therapy [facility] for tasks assessed/performed      Past Medical History:  Diagnosis Date  . Blood in urine 04/16/2013  . Breast mass 04/16/12  . BV (bacterial vaginosis)   . Edema    legs  . Hypertension   . Thyroid disease     Past Surgical History:  Procedure Laterality Date  . BREAST BIOPSY Right 2012-04-16   benign - fatty tissue  . BREAST BIOPSY Left 06-12-13   fibroadenoma /core biopsy  . BREAST BIOPSY Right 06-17-13   fibrocystic /stereo biopsy  . CESAREAN SECTION  04-16-2006  . DILATION AND CURETTAGE OF UTERUS    . FOOT SURGERY Left    01/2017  . ROUX-EN-Y PROCEDURE  03/04/2014   Dr. Saint Lucia  . TUBAL LIGATION      There were no vitals filed for this visit.      Subjective Assessment - 04/25/17 1628    Subjective Pt arrived late to appointment, limiting session.  Pt reports today is a good day but this past weekend her L medial foot/ankle was hurting and burning like it did before surgery.  Even had pain when sitting.  She reports she was very active Thursday through the weekend moving her daughter and flower shopping.  She is concerned about her L ankle not feeling stable.     Pertinent History Pt reports chronic L foot pain and swelling as well as plantar fasciitis. She was initially scheduled to have surgery at [facility] a couple years ago but "chickened out." On  01/20/17 she underwent medial displacement calcaneal osteotomy left foot, debridement and repair of posterior tibial tendon left foot, repair spring ligament, transfer flexor digitorum longus tendon to navicular, and Strayer procedure to lengthen Achilles tendon. She reports no complications after surgery. She was non-weightbearing for the first 6 weeks. She went back to see her orthopedic surgeon Thursday 03/02/17 and was placed in a post-op boot that she has been using since that time. She doesn't recall any current weightbearing restrictions and has been WBAT since that time with lofstrand crutches. Order received from surgeon for physical therapy states PWB progressing to FWB. Order is for AROM/PROM and strengthening. No further protocol or restrictions provided at this time. Pt denies any injury since surgery. She reports some problems with healing of the incision on the posterior calcaneus.    Patient Stated Goals Return to prior function with less pain   Currently in Pain? Yes   Pain Score 2    Pain Location Ankle   Pain Orientation Left   Pain Descriptors / Indicators Aching   Pain Type Chronic pain   Pain Onset More than a month ago   Pain Frequency Constant   Multiple Pain Sites No       Manual Therapy:  Extensive light massage distal>proximal  starting at toes and up past ankle for decreased swelling as effusion noted throughout L foot and ankle  ?  Therapeutic Exercise:  Forward lunges on BOSU lunges with LLE 2x15  Standing bilateral heel raises - 2 x 20 on airex pad  Marches on bosu ball with blue side up - x 10 each LE  Single leg squats on Total gym at level 14 x20 with airex pad. Cues for ankle in neutral as pt with tendency toward eversion. Pt able to correct with verbal cues and visual feedback.  SLS on LLE while bringing LLE through full cycle of swing phase 2x20  Ant/post, lateral weight shifts on black side bosu ball - x 10 each direction         PT Education -  04/25/17 1627    Education provided Yes   Education Details Exercise technique   Person(s) Educated Patient   Methods Explanation;Demonstration   Comprehension Verbalized understanding;Returned demonstration;Need further instruction             PT Long Term Goals - 03/08/17 1133      PT LONG TERM GOAL #1   Title Pt will be independent with HEP in order to improve L ankle/foot strength, mobility, and proprioception in order to decrease pain and improve function at home and work.    Time 8   Period Weeks   Status New     PT LONG TERM GOAL #2   Title Pt will increase LEFS by at least 9 points in order to demonstrate significant improvement in lower extremity function.    Baseline 03/07/17: 26/80   Time 8   Period Weeks   Status New     PT LONG TERM GOAL #3   Title Pt will improve L ankle strength for dorsiflexion, plantarflexion, inversion, and eversion to at least 4/5   Baseline 03/07/17: unable to test   Time 8   Period Weeks   Status New     PT LONG TERM GOAL #4   Title Pt will improve L ankle dorsiflexion to at least 10 degrees past neutral in order to improve normal gait mechanics and stair ascend/descent   Baseline 03/07/17: -10 degrees   Time 8   Period Weeks   Status New               Plan - 04/25/17 1642    Clinical Impression Statement Pt continues to demonstrate L ankle and foot swelling that responds well to light massage.  She demonstrates L ankle instability with exercises on uneven surfaces and reports fatigue in L hip stabilizers with lunges.  Encouraged pt to continue with HEP as previously prescribed.  She will continue to benefit from continued skilled PT interventions for improved stability, strength, and QOL.   Rehab Potential Excellent   Clinical Impairments Affecting Rehab Potential Positive: motivation, age, support; Negative: chronicity of pain, extensive surgery   PT Frequency 2x / week   PT Duration 8 weeks   PT Treatment/Interventions  ADLs/Self Care Home Management;Aquatic Therapy;Cryotherapy;Electrical Stimulation;Moist Heat;Iontophoresis 4mg /ml Dexamethasone;Ultrasound;Fluidtherapy;Contrast Bath;DME Instruction;Gait training;Stair training;Functional mobility training;Therapeutic activities;Therapeutic exercise;Balance training;Neuromuscular re-education;Patient/family education;Manual techniques;Scar mobilization;Passive range of motion   PT Next Visit Plan Ankle strengthening, standing squats, continue AROM/PROM, scar massage and manual techniques as appropriate   PT Home Exercise Plan standing gastroc stretch out of boot, standing soleus stretch out of boot, L ankle yellow tband resisted ankle inversion/eversion (progress to red band as able), L ankle green tband resisted ankle plantarflexion/dorsiflexion, standing L single leg balance  with UE support out of walking boot, standing bilateral heel raises with gradually increasing weight on LLE   Consulted and Agree with Plan of Care Patient      Patient will benefit from skilled therapeutic intervention in order to improve the following deficits and impairments:  Abnormal gait, Decreased balance, Decreased range of motion, Decreased strength, Hypomobility, Impaired flexibility, Pain, Impaired UE functional use  Visit Diagnosis: Muscle weakness (generalized)  Stiffness of left ankle, not elsewhere classified     Problem List Patient Active Problem List   Diagnosis Date Noted  . Menorrhagia with irregular cycle 03/30/2017  . Climacteric 03/30/2017  . Abnormal uterine bleeding (AUB) 03/30/2017  . Thoracic back pain 08/18/2016  . Sinus congestion 12/22/2015  . Varicose veins 11/17/2015  . Hearing loss 05/25/2015  . Bilateral thigh pain 11/17/2014  . Status post tubal ligation 09/22/2014  . Obstructive sleep apnea 06/27/2013  . Microscopic hematuria 06/27/2013  . Renal colic 19/80/2217  . Routine general medical examination at a health care facility 05/15/2013  .  Goiter, nontoxic, multinodular 02/25/2013  . Acquired flat foot 11/20/2012  . Obesity 11/12/2012  . Hypertension 05/09/2012    Collie Siad PT, DPT 04/25/2017, 4:59 PM  [facility] [address] [place], [address] Phone: [phone]   Fax:  [phone]  Name: [name] MRN: [id number] Date of Birth: 02-23-1971

## 2017-04-27 ENCOUNTER — Encounter: Payer: BLUE CROSS/BLUE SHIELD | Admitting: Physical Therapy

## 2017-04-27 ENCOUNTER — Ambulatory Visit: Payer: BLUE CROSS/BLUE SHIELD

## 2017-04-27 ENCOUNTER — Other Ambulatory Visit: Payer: Self-pay | Admitting: Obstetrics and Gynecology

## 2017-04-27 DIAGNOSIS — M25672 Stiffness of left ankle, not elsewhere classified: Secondary | ICD-10-CM

## 2017-04-27 DIAGNOSIS — M6281 Muscle weakness (generalized): Secondary | ICD-10-CM

## 2017-04-27 NOTE — Therapy (Signed)
[place] [facility] [address], [place], [address] Phone: [phone]   Fax:  [phone]  Physical Therapy Treatment  Patient Details  Name: [name] MRN: [id number] Date of Birth: 1971/02/20 Referring Provider: Moses Manners PA-C  Encounter Date: 04/27/2017      PT End of Session - 04/27/17 1341    Visit Number 15   Number of Visits 17   Date for PT Re-Evaluation 05/02/17   Authorization Type no g codes   PT Start Time 1300   PT Stop Time 1345   PT Time Calculation (min) 45 min   Activity Tolerance Patient tolerated treatment well   Behavior During Therapy [facility] for tasks assessed/performed      Past Medical History:  Diagnosis Date  . Blood in urine 2014  . Breast mass 2013  . BV (bacterial vaginosis)   . Edema    legs  . Hypertension   . Thyroid disease     Past Surgical History:  Procedure Laterality Date  . BREAST BIOPSY Right 2013   benign - fatty tissue  . BREAST BIOPSY Left 06-12-13   fibroadenoma /core biopsy  . BREAST BIOPSY Right 06-17-13   fibrocystic /stereo biopsy  . CESAREAN SECTION  2007  . DILATION AND CURETTAGE OF UTERUS    . FOOT SURGERY Left    01/2017  . ROUX-EN-Y PROCEDURE  03/04/2014   Dr. Saint Lucia  . TUBAL LIGATION      There were no vitals filed for this visit.      Subjective Assessment - 04/27/17 1322    Subjective Patient reports her ankle is feeling much better since the previous weekend. Patient reports she continues to have increased swelling in the ankle.    Pertinent History Pt reports chronic L foot pain and swelling as well as plantar fasciitis. She was initially scheduled to have surgery at [facility] a couple years ago but "chickened out." On 01/20/17 she underwent medial displacement calcaneal osteotomy left foot, debridement and repair of posterior tibial tendon left foot, repair spring ligament, transfer flexor digitorum longus tendon to navicular, and  Strayer procedure to lengthen Achilles tendon. She reports no complications after surgery. She was non-weightbearing for the first 6 weeks. She went back to see her orthopedic surgeon Thursday 03/02/17 and was placed in a post-op boot that she has been using since that time. She doesn't recall any current weightbearing restrictions and has been WBAT since that time with lofstrand crutches. Order received from surgeon for physical therapy states PWB progressing to FWB. Order is for AROM/PROM and strengthening. No further protocol or restrictions provided at this time. Pt denies any injury since surgery. She reports some problems with healing of the incision on the posterior calcaneus.    Patient Stated Goals Return to prior function with less pain   Currently in Pain? No/denies   Pain Onset More than a month ago          TREATMENT    Manual Therapy:  Extensive light massage distal>proximal starting at toes and up past ankle for decreased swelling as effusion noted throughout L foot and ankle;   Therapeutic Exercise:  Standing bilateral heel raises/toe raises - 2 x 20 on airex pad  Single leg balance on airex pad - 45sec x 2 Single leg Squats on Total gym at 17 level - x 20 with airex pad; calve raises - 2 x 20 level 12 Leg Press in sitting at [facility] - 75#  x20; 85# x20 Ant/post, lateral, circular weight shifts on large dynadisc with L LE  - x 25 each direction  Tandem ambulation - 2 x 73ft Running man with blue airex pad - x 20        PT Education - 04/27/17 1340    Education provided Yes   Education Details Form/technique with exercise   Person(s) Educated Patient   Methods Explanation;Demonstration   Comprehension Verbalized understanding;Returned demonstration             PT Long Term Goals - 03/08/17 1133      PT LONG TERM GOAL #1   Title Pt will be independent with HEP in order to improve L ankle/foot strength, mobility, and proprioception in order to decrease pain and  improve function at home and work.    Time 8   Period Weeks   Status New     PT LONG TERM GOAL #2   Title Pt will increase LEFS by at least 9 points in order to demonstrate significant improvement in lower extremity function.    Baseline 03/07/17: 26/80   Time 8   Period Weeks   Status New     PT LONG TERM GOAL #3   Title Pt will improve L ankle strength for dorsiflexion, plantarflexion, inversion, and eversion to at least 4/5   Baseline 03/07/17: unable to test   Time 8   Period Weeks   Status New     PT LONG TERM GOAL #4   Title Pt will improve L ankle dorsiflexion to at least 10 degrees past neutral in order to improve normal gait mechanics and stair ascend/descent   Baseline 03/07/17: -10 degrees   Time 8   Period Weeks   Status New               Plan - 04/27/17 1341    Clinical Impression Statement Patient demonstrates increased postural sway with exercises indicating decreased propioception within the ankle and decreased dynamic/static balance. Patient demonstrates decreased gastroc strength requiring lowering angle on the Total gym to perform. Patient demonstrates no increase in pain throughout session and will benefit from futher skilled therapy to return to prior level of function.    Rehab Potential Excellent   Clinical Impairments Affecting Rehab Potential Positive: motivation, age, support; Negative: chronicity of pain, extensive surgery   PT Frequency 2x / week   PT Duration 8 weeks   PT Treatment/Interventions ADLs/Self Care Home Management;Aquatic Therapy;Cryotherapy;Electrical Stimulation;Moist Heat;Iontophoresis 4mg /ml Dexamethasone;Ultrasound;Fluidtherapy;Contrast Bath;DME Instruction;Gait training;Stair training;Functional mobility training;Therapeutic activities;Therapeutic exercise;Balance training;Neuromuscular re-education;Patient/family education;Manual techniques;Scar mobilization;Passive range of motion   PT Next Visit Plan Ankle strengthening,  standing squats, continue AROM/PROM, scar massage and manual techniques as appropriate   PT Home Exercise Plan standing gastroc stretch out of boot, standing soleus stretch out of boot, L ankle yellow tband resisted ankle inversion/eversion (progress to red band as able), L ankle green tband resisted ankle plantarflexion/dorsiflexion, standing L single leg balance with UE support out of walking boot, standing bilateral heel raises with gradually increasing weight on LLE   Consulted and Agree with Plan of Care Patient      Patient will benefit from skilled therapeutic intervention in order to improve the following deficits and impairments:  Abnormal gait, Decreased balance, Decreased range of motion, Decreased strength, Hypomobility, Impaired flexibility, Pain, Impaired UE functional use  Visit Diagnosis: Muscle weakness (generalized)  Stiffness of left ankle, not elsewhere classified     Problem List Patient Active Problem List   Diagnosis Date Noted  .  Menorrhagia with irregular cycle 03/30/2017  . Climacteric 03/30/2017  . Abnormal uterine bleeding (AUB) 03/30/2017  . Thoracic back pain 08/18/2016  . Sinus congestion 12/22/2015  . Varicose veins 11/17/2015  . Hearing loss 05/25/2015  . Bilateral thigh pain 11/17/2014  . Status post tubal ligation 09/22/2014  . Obstructive sleep apnea 06/27/2013  . Microscopic hematuria 06/27/2013  . Renal colic 51/46/0479  . Routine general medical examination at a health care facility 05/15/2013  . Goiter, nontoxic, multinodular 02/25/2013  . Acquired flat foot 11/20/2012  . Obesity 11/12/2012  . Hypertension 05/09/2012    Blythe Stanford, PT DPT 04/27/2017, 1:50 PM  [facility] [address] [place], [address] Phone: [phone]   Fax:  [phone]  Name: [name] MRN: [id number] Date of Birth: Jul 02, 1971

## 2017-05-01 ENCOUNTER — Ambulatory Visit: Payer: BLUE CROSS/BLUE SHIELD

## 2017-05-01 DIAGNOSIS — M25672 Stiffness of left ankle, not elsewhere classified: Secondary | ICD-10-CM

## 2017-05-01 DIAGNOSIS — M6281 Muscle weakness (generalized): Secondary | ICD-10-CM

## 2017-05-01 DIAGNOSIS — M76822 Posterior tibial tendinitis, left leg: Secondary | ICD-10-CM | POA: Diagnosis not present

## 2017-05-01 NOTE — Therapy (Signed)
[place] [facility] Apr 15, [address] [place], [address] Phone: [phone]   Fax:  [phone]  Physical Therapy Treatment  Patient Details  Name: [name] MRN: [id number] Date of Birth: 07/09/1971 Referring Provider: Moses Manners PA-C  Encounter Date: 05/01/2017      PT End of Session - 05/01/17 1656    Visit Number 16   Number of Visits 17   Date for PT Re-Evaluation 05/02/17   Authorization Type no g codes   PT Start Time April 16, 1619   PT Stop Time 1700   PT Time Calculation (min) 40 min   Activity Tolerance Patient tolerated treatment well   Behavior During Therapy [facility] for tasks assessed/performed      Past Medical History:  Diagnosis Date  . Blood in urine 04-15-13  . Breast mass 2012/04/15  . BV (bacterial vaginosis)   . Edema    legs  . Hypertension   . Thyroid disease     Past Surgical History:  Procedure Laterality Date  . BREAST BIOPSY Right 04-15-12   benign - fatty tissue  . BREAST BIOPSY Left 06-12-13   fibroadenoma /core biopsy  . BREAST BIOPSY Right 06-17-13   fibrocystic /stereo biopsy  . CESAREAN SECTION  Apr 15, 2006  . DILATION AND CURETTAGE OF UTERUS    . FOOT SURGERY Left    01/2017  . ROUX-EN-Y PROCEDURE  03/04/2014   Dr. Saint Lucia  . TUBAL LIGATION      There were no vitals filed for this visit.      Subjective Assessment - 05/01/17 1641    Subjective Patient reports she had minor increase in pain after spending all weekend on her feet. Patient reports her ankle is feeling much better today .   Pertinent History Pt reports chronic L foot pain and swelling as well as plantar fasciitis. She was initially scheduled to have surgery at [facility] a couple years ago but "chickened out." On 01/20/17 she underwent medial displacement calcaneal osteotomy left foot, debridement and repair of posterior tibial tendon left foot, repair spring ligament, transfer flexor digitorum longus tendon to navicular, and Strayer  procedure to lengthen Achilles tendon. She reports no complications after surgery. She was non-weightbearing for the first 6 weeks. She went back to see her orthopedic surgeon Thursday 03/02/17 and was placed in a post-op boot that she has been using since that time. She doesn't recall any current weightbearing restrictions and has been WBAT since that time with lofstrand crutches. Order received from surgeon for physical therapy states PWB progressing to FWB. Order is for AROM/PROM and strengthening. No further protocol or restrictions provided at this time. Pt denies any injury since surgery. She reports some problems with healing of the incision on the posterior calcaneus.    Patient Stated Goals Return to prior function with less pain   Currently in Pain? No/denies   Pain Onset More than a month ago        TREATMENT    Manual Therapy:  Extensive light massage distal>proximal starting at toes and up past ankle for decreased swelling as effusion noted throughout L foot and ankle;   Therapeutic Exercise:  Arch ups in sitting for motor control - x18min for focus on control Single leg stance with reaching out of support with legs - x 10 each direction B Ant/post, lateral, circular weight shifts on dynadisc with L LE  - x 25 each direction  Heel lifts off of 4" step - x 20  Heel walking/ toe walking - x 41ft  Single leg Squats on Total gym at 19 level - 2 x 20 with airex pad Tandem ambulation - 2 x 69ft Hip machine hip abduction - x 20 55#       PT Education - 05/01/17 1649    Education provided Yes   Education Details form/technique with exercise   Person(s) Educated Patient   Methods Explanation;Demonstration   Comprehension Verbalized understanding;Returned demonstration             PT Long Term Goals - 03/08/17 1133      PT LONG TERM GOAL #1   Title Pt will be independent with HEP in order to improve L ankle/foot strength, mobility, and proprioception in order to decrease  pain and improve function at home and work.    Time 8   Period Weeks   Status New     PT LONG TERM GOAL #2   Title Pt will increase LEFS by at least 9 points in order to demonstrate significant improvement in lower extremity function.    Baseline 03/07/17: 26/80   Time 8   Period Weeks   Status New     PT LONG TERM GOAL #3   Title Pt will improve L ankle strength for dorsiflexion, plantarflexion, inversion, and eversion to at least 4/5   Baseline 03/07/17: unable to test   Time 8   Period Weeks   Status New     PT LONG TERM GOAL #4   Title Pt will improve L ankle dorsiflexion to at least 10 degrees past neutral in order to improve normal gait mechanics and stair ascend/descent   Baseline 03/07/17: -10 degrees   Time 8   Period Weeks   Status New               Plan - 05/01/17 1657    Clinical Impression Statement Patient demonstrates improvement in balance today demonstrating less postural sway with tandem ambulation indicating improvement in proprioception. Patient continues to demonstrate increased swelling and decreased instrinsic foot weakness indicating foot/ankle dysfunction and patient will benefit from further skilled therapy to return to prior level of function.    Rehab Potential Excellent   Clinical Impairments Affecting Rehab Potential Positive: motivation, age, support; Negative: chronicity of pain, extensive surgery   PT Frequency 2x / week   PT Duration 8 weeks   PT Treatment/Interventions ADLs/Self Care Home Management;Aquatic Therapy;Cryotherapy;Electrical Stimulation;Moist Heat;Iontophoresis 4mg /ml Dexamethasone;Ultrasound;Fluidtherapy;Contrast Bath;DME Instruction;Gait training;Stair training;Functional mobility training;Therapeutic activities;Therapeutic exercise;Balance training;Neuromuscular re-education;Patient/family education;Manual techniques;Scar mobilization;Passive range of motion   PT Next Visit Plan Ankle strengthening, standing squats, continue  AROM/PROM, scar massage and manual techniques as appropriate   PT Home Exercise Plan standing gastroc stretch out of boot, standing soleus stretch out of boot, L ankle yellow tband resisted ankle inversion/eversion (progress to red band as able), L ankle green tband resisted ankle plantarflexion/dorsiflexion, standing L single leg balance with UE support out of walking boot, standing bilateral heel raises with gradually increasing weight on LLE   Consulted and Agree with Plan of Care Patient      Patient will benefit from skilled therapeutic intervention in order to improve the following deficits and impairments:  Abnormal gait, Decreased balance, Decreased range of motion, Decreased strength, Hypomobility, Impaired flexibility, Pain, Impaired UE functional use  Visit Diagnosis: Muscle weakness (generalized)  Stiffness of left ankle, not elsewhere classified     Problem List Patient Active Problem List   Diagnosis Date Noted  . Menorrhagia with irregular cycle  03/30/2017  . Climacteric 03/30/2017  . Abnormal uterine bleeding (AUB) 03/30/2017  . Thoracic back pain 08/18/2016  . Sinus congestion 12/22/2015  . Varicose veins 11/17/2015  . Hearing loss 05/25/2015  . Bilateral thigh pain 11/17/2014  . Status post tubal ligation 09/22/2014  . Obstructive sleep apnea 06/27/2013  . Microscopic hematuria 06/27/2013  . Renal colic 18/84/1660  . Routine general medical examination at a health care facility 05/15/2013  . Goiter, nontoxic, multinodular 02/25/2013  . Acquired flat foot 11/20/2012  . Obesity 11/12/2012  . Hypertension 05/09/2012    Blythe Stanford, PT DPT 05/01/2017, 5:00 PM  [place] [facility] [address], [place], [address] Phone: [phone]   Fax:  [phone]  Name: [name] MRN: [id number] Date of Birth: 1971-04-01

## 2017-05-03 ENCOUNTER — Ambulatory Visit: Payer: BLUE CROSS/BLUE SHIELD

## 2017-05-03 DIAGNOSIS — M6281 Muscle weakness (generalized): Secondary | ICD-10-CM

## 2017-05-03 DIAGNOSIS — M25672 Stiffness of left ankle, not elsewhere classified: Secondary | ICD-10-CM | POA: Diagnosis not present

## 2017-05-03 NOTE — Addendum Note (Signed)
Addended by: Blain Pais on: 05/03/2017 03:26 PM   Modules accepted: Orders

## 2017-05-03 NOTE — Therapy (Signed)
[place] [facility] Apr 16, [address] [place], [address] Phone: [phone]   Fax:  [phone]  Physical Therapy Treatment  Patient Details  Name: [name] MRN: [id number] Date of Birth: [birth year] Referring Provider: Moses Manners PA-C  Encounter Date: 05/03/2017      PT End of Session - 05/03/17 1454    Visit Number 17   Number of Visits 25   Date for PT Re-Evaluation 05/31/17   Authorization Type no g codes   PT Start Time Apr 17, 1435   PT Stop Time 1515   PT Time Calculation (min) 39 min   Activity Tolerance Patient tolerated treatment well   Behavior During Therapy [facility] for tasks assessed/performed      Past Medical History:  Diagnosis Date  . Blood in urine 04-16-2013  . Breast mass 04/16/12  . BV (bacterial vaginosis)   . Edema    legs  . Hypertension   . Thyroid disease     Past Surgical History:  Procedure Laterality Date  . BREAST BIOPSY Right 16-Apr-2012   benign - fatty tissue  . BREAST BIOPSY Left 06-12-13   fibroadenoma /core biopsy  . BREAST BIOPSY Right 06-17-13   fibrocystic /stereo biopsy  . CESAREAN SECTION  16-Apr-2006  . DILATION AND CURETTAGE OF UTERUS    . FOOT SURGERY Left    01/2017  . ROUX-EN-Y PROCEDURE  03/04/2014   Dr. Saint Lucia  . TUBAL LIGATION      There were no vitals filed for this visit.      Subjective Assessment - 05/03/17 1452    Subjective Patient reports no major changes since the previous visit. Patient reports no pain in the ankle currently.    Pertinent History Pt reports chronic L foot pain and swelling as well as plantar fasciitis. She was initially scheduled to have surgery at [facility] a couple years ago but "chickened out." On 01/20/17 she underwent medial displacement calcaneal osteotomy left foot, debridement and repair of posterior tibial tendon left foot, repair spring ligament, transfer flexor digitorum longus tendon to navicular, and Strayer procedure to lengthen Achilles  tendon. She reports no complications after surgery. She was non-weightbearing for the first 6 weeks. She went back to see her orthopedic surgeon Thursday 03/02/17 and was placed in a post-op boot that she has been using since that time. She doesn't recall any current weightbearing restrictions and has been WBAT since that time with lofstrand crutches. Order received from surgeon for physical therapy states PWB progressing to FWB. Order is for AROM/PROM and strengthening. No further protocol or restrictions provided at this time. Pt denies any injury since surgery. She reports some problems with healing of the incision on the posterior calcaneus.    Patient Stated Goals Return to prior function with less pain   Currently in Pain? No/denies   Pain Onset More than a month ago      TREATMENT   LEFS: 63/80 Dorsiflexion: 5 deg on the L ankle MMT: 4+/5 for all ankle motions on the L ankle; 3+/5 Inversion   Manual Therapy:  Extensive light massage distal>proximal starting at toes and up past ankle for decreased swelling as effusion noted throughout L foot and ankle;   Therapeutic Exercise:  Arch ups in sitting for motor control - x38min for focus on control; Arch ups in standing - x 64min Single leg stance with reaching out of support with legs - x 10 each direction B Heel lifts off of airex pad -  2x 20 Hip machine hip abduction - x 20 55# Ankle inversion with RTB - x 20  Lunges in standing - 2 x 10   Patient reports well to therapy, reports increased stiffness in the ankle today.       PT Education - 05/03/17 1454    Education provided Yes   Education Details form/technique with exercise   Person(s) Educated Patient   Methods Explanation;Demonstration   Comprehension Verbalized understanding;Returned demonstration             PT Long Term Goals - 05/03/17 1505      PT LONG TERM GOAL #1   Title Pt will be independent with HEP in order to improve L ankle/foot strength, mobility, and  proprioception in order to decrease pain and improve function at home and work.    Time 8   Period Weeks   Status On-going     PT LONG TERM GOAL #2   Title Pt will increase LEFS by at least 9 points in order to demonstrate significant improvement in lower extremity function.    Baseline 03/07/17: 26/80 05/03/17: 63/80   Time 8   Period Weeks   Status On-going     PT LONG TERM GOAL #3   Title Pt will improve L ankle strength for dorsiflexion, plantarflexion, inversion, and eversion to at least 4/5   Baseline 03/07/17: unable to test 05/03/17: 4+/5 for all movement other than inversion: 3+/5   Time 8   Period Weeks   Status On-going     PT LONG TERM GOAL #4   Title Pt will improve L ankle dorsiflexion to at least 10 degrees past neutral in order to improve normal gait mechanics and stair ascend/descent   Baseline 03/07/17: -10 degrees 05/03/17: 5deg   Time 8   Period Weeks   Status On-going               Plan - 05/03/17 1510    Clinical Impression Statement Patient is making progress towards long term goals demonstrating improvement in ankle strength, balance, LEFS score, and dorsiflexion range of motion indicating improvement in ankle function and ability. Although patient is improving, she conitnues to have limitations most specifically in ankle inversion and higher level balance activities. Patient will benefit from further skilled therapy focused on improving ankle funciton to return to prior level of function.    Rehab Potential Excellent   Clinical Impairments Affecting Rehab Potential Positive: motivation, age, support; Negative: chronicity of pain, extensive surgery   PT Frequency 2x / week   PT Duration 8 weeks   PT Treatment/Interventions ADLs/Self Care Home Management;Aquatic Therapy;Cryotherapy;Electrical Stimulation;Moist Heat;Iontophoresis 4mg /ml Dexamethasone;Ultrasound;Fluidtherapy;Contrast Bath;DME Instruction;Gait training;Stair training;Functional mobility  training;Therapeutic activities;Therapeutic exercise;Balance training;Neuromuscular re-education;Patient/family education;Manual techniques;Scar mobilization;Passive range of motion   PT Next Visit Plan Ankle strengthening, standing squats, continue AROM/PROM, scar massage and manual techniques as appropriate   PT Home Exercise Plan standing gastroc stretch out of boot, standing soleus stretch out of boot, L ankle yellow tband resisted ankle inversion/eversion (progress to red band as able), L ankle green tband resisted ankle plantarflexion/dorsiflexion, standing L single leg balance with UE support out of walking boot, standing bilateral heel raises with gradually increasing weight on LLE   Consulted and Agree with Plan of Care Patient      Patient will benefit from skilled therapeutic intervention in order to improve the following deficits and impairments:  Abnormal gait, Decreased balance, Decreased range of motion, Decreased strength, Hypomobility, Impaired flexibility, Pain, Impaired UE functional use  Visit Diagnosis: Muscle weakness (generalized)  Stiffness of left ankle, not elsewhere classified     Problem List Patient Active Problem List   Diagnosis Date Noted  . Menorrhagia with irregular cycle 03/30/2017  . Climacteric 03/30/2017  . Abnormal uterine bleeding (AUB) 03/30/2017  . Thoracic back pain 08/18/2016  . Sinus congestion 12/22/2015  . Varicose veins 11/17/2015  . Hearing loss 05/25/2015  . Bilateral thigh pain 11/17/2014  . Status post tubal ligation 09/22/2014  . Obstructive sleep apnea 06/27/2013  . Microscopic hematuria 06/27/2013  . Renal colic 87/57/9728  . Routine general medical examination at a health care facility 05/15/2013  . Goiter, nontoxic, multinodular 02/25/2013  . Acquired flat foot 11/20/2012  . Obesity 11/12/2012  . Hypertension 05/09/2012    Blythe Stanford, PT DPT 05/03/2017, 3:23 PM  [place] [facility] [address] [place], [address] Phone: [phone]   Fax:  [phone]  Name: KURSTEN KRUK MRN: [id number] Date of Birth: Sep 26, 1971

## 2017-05-04 ENCOUNTER — Encounter: Payer: BLUE CROSS/BLUE SHIELD | Admitting: Obstetrics and Gynecology

## 2017-05-08 ENCOUNTER — Ambulatory Visit: Payer: BLUE CROSS/BLUE SHIELD

## 2017-05-11 ENCOUNTER — Ambulatory Visit: Payer: BLUE CROSS/BLUE SHIELD

## 2017-05-11 DIAGNOSIS — M6281 Muscle weakness (generalized): Secondary | ICD-10-CM | POA: Diagnosis not present

## 2017-05-11 DIAGNOSIS — M25672 Stiffness of left ankle, not elsewhere classified: Secondary | ICD-10-CM

## 2017-05-11 NOTE — Therapy (Signed)
[facility] [address] [place], [place], [address] Phone: [phone]   Fax:  [phone]  Physical Therapy Treatment/DIscharge Summary  Patient Details  Name: [name] MRN: [id number] Date of Birth: 10/21/1971 Referring Provider: Tiffany Stephens PA-C  Encounter Date: 05/11/2017      PT End of Session - 05/11/17 1700    Visit Number 18   Number of Visits 25   Date for PT Re-Evaluation 05/31/17   Authorization Type no g codes   PT Start Time 1615   PT Stop Time 1700   PT Time Calculation (min) 45 min   Activity Tolerance Patient tolerated treatment well   Behavior During Therapy WFL for tasks assessed/performed      Past Medical History:  Diagnosis Date  . Blood in urine 2014  . Breast mass 2013  . BV (bacterial vaginosis)   . Edema    legs  . Hypertension   . Thyroid disease     Past Surgical History:  Procedure Laterality Date  . BREAST BIOPSY Right 2013   benign - fatty tissue  . BREAST BIOPSY Left 06-12-13   fibroadenoma /core biopsy  . BREAST BIOPSY Right 06-17-13   fibrocystic /stereo biopsy  . CESAREAN SECTION  2007  . DILATION AND CURETTAGE OF UTERUS    . FOOT SURGERY Left    01/2017  . ROUX-EN-Y PROCEDURE  03/04/2014   Dr. Sudan  . TUBAL LIGATION      There were no vitals filed for this visit.      Subjective Assessment - 05/11/17 1648    Subjective Patient reports no current pain in the ankle. Reports it is her last visit and is prepared for discharge from PT.    Pertinent History Pt reports chronic L foot pain and swelling as well as plantar fasciitis. She was initially scheduled to have surgery at [facility] a couple years ago but "chickened out." On 01/20/17 she underwent medial displacement calcaneal osteotomy left foot, debridement and repair of posterior tibial tendon left foot, repair spring ligament, transfer flexor digitorum longus tendon to navicular, and Strayer procedure to  lengthen Achilles tendon. She reports no complications after surgery. She was non-weightbearing for the first 6 weeks. She went back to see her orthopedic surgeon Thursday 03/02/17 and was placed in a post-op boot that she has been using since that time. She doesn't recall any current weightbearing restrictions and has been WBAT since that time with lofstrand crutches. Order received from surgeon for physical therapy states PWB progressing to FWB. Order is for AROM/PROM and strengthening. No further protocol or restrictions provided at this time. Pt denies any injury since surgery. She reports some problems with healing of the incision on the posterior calcaneus.    Patient Stated Goals Return to prior function with less pain   Currently in Pain? No/denies   Pain Onset More than a month ago      TREATMENT: Step ups up to 8" step -- x20 without UE support B Total gym single leg squats at 26 -- x10 B  Lunges in standing -- x 10 B Tandem walking -- x2 laps; x30ft Mini squats -- x 10 with UE support Hip abduction -- x10 B with cueing to laterally bend towards lifting side Single leg stance -- 3 x 30sec Hip hiking off of 3 " step -- x15  CKC dorsiflexion against wall -- x 15 B       PT Education - 05/11/17   1649    Education provided Yes   Education Details Review HEP added EXS   Person(s) Educated Patient   Methods Explanation;Demonstration   Comprehension Verbalized understanding;Returned demonstration             PT Long Term Goals - 05/11/17 1702      PT LONG TERM GOAL #1   Title Pt will be independent with HEP in order to improve L ankle/foot strength, mobility, and proprioception in order to decrease pain and improve function at home and work.    Baseline independent   Time 8   Period Weeks   Status Achieved     PT LONG TERM GOAL #2   Title Pt will increase LEFS by at least 9 points in order to demonstrate significant improvement in lower extremity function.    Baseline  03/07/17: 26/80 05/03/17: 63/80   Time 8   Period Weeks   Status Achieved     PT LONG TERM GOAL #3   Title Pt will improve L ankle strength for dorsiflexion, plantarflexion, inversion, and eversion to at least 4/5   Baseline 03/07/17: unable to test 05/11/17: 4+/5 for all movement other than inversion: 4/5   Time 8   Period Weeks   Status Achieved     PT LONG TERM GOAL #4   Title Pt will improve L ankle dorsiflexion to at least 10 degrees past neutral in order to improve normal gait mechanics and stair ascend/descent   Baseline 03/07/17: -10 degrees 05/03/17: 5deg; 05/11/17: 10deg   Time 8   Period Weeks   Status Achieved               Plan - 05/11/17 1700    Clinical Impression Statement Patient has met all long term goals and demonstrates independence with exercise performance indicating ability to perform exercises at home without cueing. Patient has met all long term goals and is to be discharged from physical therapy.    Rehab Potential Excellent   Clinical Impairments Affecting Rehab Potential Positive: motivation, age, support; Negative: chronicity of pain, extensive surgery   PT Frequency 2x / week   PT Duration 8 weeks   PT Treatment/Interventions ADLs/Self Care Home Management;Aquatic Therapy;Cryotherapy;Electrical Stimulation;Moist Heat;Iontophoresis 37m/ml Dexamethasone;Ultrasound;Fluidtherapy;Contrast Bath;DME Instruction;Gait training;Stair training;Functional mobility training;Therapeutic activities;Therapeutic exercise;Balance training;Neuromuscular re-education;Patient/family education;Manual techniques;Scar mobilization;Passive range of motion   PT Next Visit Plan Ankle strengthening, standing squats, continue AROM/PROM, scar massage and manual techniques as appropriate   PT Home Exercise Plan standing gastroc stretch out of boot, standing soleus stretch out of boot, L ankle yellow tband resisted ankle inversion/eversion (progress to red band as able), L ankle green  tband resisted ankle plantarflexion/dorsiflexion, standing L single leg balance with UE support out of walking boot, standing bilateral heel raises with gradually increasing weight on LLE   Consulted and Agree with Plan of Care Patient      Patient will benefit from skilled therapeutic intervention in order to improve the following deficits and impairments:  Abnormal gait, Decreased balance, Decreased range of motion, Decreased strength, Hypomobility, Impaired flexibility, Pain, Impaired UE functional use  Visit Diagnosis: Muscle weakness (generalized)  Stiffness of left ankle, not elsewhere classified     Problem List Patient Active Problem List   Diagnosis Date Noted  . Menorrhagia with irregular cycle 03/30/2017  . Climacteric 03/30/2017  . Abnormal uterine bleeding (AUB) 03/30/2017  . Thoracic back pain 08/18/2016  . Sinus congestion 12/22/2015  . Varicose veins 11/17/2015  . Hearing loss 05/25/2015  . Bilateral thigh  pain 11/17/2014  . Status post tubal ligation 09/22/2014  . Obstructive sleep apnea 06/27/2013  . Microscopic hematuria 06/27/2013  . Renal colic 06/27/2013  . Routine general medical examination at a health care facility 05/15/2013  . Goiter, nontoxic, multinodular 02/25/2013  . Acquired flat foot 11/20/2012  . Obesity 11/12/2012  . Hypertension 05/09/2012    [place] Rissell, PT DPT 05/11/2017, 5:04 PM  [facility] [address] [place], [place], [address] Phone: [phone]   Fax:  [phone]  Name: [name] MRN: [id number] Date of Birth: 05/18/1971   

## 2017-05-29 ENCOUNTER — Encounter: Payer: Self-pay | Admitting: Family

## 2017-05-29 ENCOUNTER — Ambulatory Visit (INDEPENDENT_AMBULATORY_CARE_PROVIDER_SITE_OTHER): Payer: BLUE CROSS/BLUE SHIELD | Admitting: Family

## 2017-05-29 VITALS — BP 120/76 | HR 81 | Temp 98.7°F | Ht 66.0 in | Wt 254.4 lb

## 2017-05-29 DIAGNOSIS — Z0000 Encounter for general adult medical examination without abnormal findings: Secondary | ICD-10-CM | POA: Diagnosis not present

## 2017-05-29 DIAGNOSIS — T753XXA Motion sickness, initial encounter: Secondary | ICD-10-CM | POA: Diagnosis not present

## 2017-05-29 DIAGNOSIS — I10 Essential (primary) hypertension: Secondary | ICD-10-CM

## 2017-05-29 MED ORDER — HYDROCHLOROTHIAZIDE 25 MG PO TABS: 25.0000 mg | ORAL_TABLET | Freq: Every day | ORAL | 3 refills | Status: DC

## 2017-05-29 MED ORDER — SCOPOLAMINE 1 MG/3DAYS TD PT72: 1.0000 | MEDICATED_PATCH | TRANSDERMAL | 0 refills | Status: DC

## 2017-05-29 NOTE — Assessment & Plan Note (Signed)
Controlled. Continue current regimen. 

## 2017-05-29 NOTE — Assessment & Plan Note (Signed)
Clinical breast exam performed today. Screening labs ordered. Patient follows with OB/GYN. Encouraged exercise. Referral to derm for annual skin check.

## 2017-05-29 NOTE — Patient Instructions (Signed)
Fasting labs when you have the chance  Pleasure seeing you    Health Maintenance, Female Adopting a healthy lifestyle and getting preventive care can go a long way to promote health and wellness. Talk with your health care provider about what schedule of regular examinations is right for you. This is a good chance for you to check in with your provider about disease prevention and staying healthy. In between checkups, there are plenty of things you can do on your own. Experts have done a lot of research about which lifestyle changes and preventive measures are most likely to keep you healthy. Ask your health care provider for more information. Weight and diet Eat a healthy diet  Be sure to include plenty of vegetables, fruits, low-fat dairy products, and lean protein.  Do not eat a lot of foods high in solid fats, added sugars, or salt.  Get regular exercise. This is one of the most important things you can do for your health. ? Most adults should exercise for at least 150 minutes each week. The exercise should increase your heart rate and make you sweat (moderate-intensity exercise). ? Most adults should also do strengthening exercises at least twice a week. This is in addition to the moderate-intensity exercise.  Maintain a healthy weight  Body mass index (BMI) is a measurement that can be used to identify possible weight problems. It estimates body fat based on height and weight. Your health care provider can help determine your BMI and help you achieve or maintain a healthy weight.  For females 46 years of age and older: ? A BMI below 18.5 is considered underweight. ? A BMI of 18.5 to 24.9 is normal. ? A BMI of 25 to 29.9 is considered overweight. ? A BMI of 30 and above is considered obese.  Watch levels of cholesterol and blood lipids  You should start having your blood tested for lipids and cholesterol at 46 years of age, then have this test every 5 years.  You may need to  have your cholesterol levels checked more often if: ? Your lipid or cholesterol levels are high. ? You are older than 46 years of age. ? You are at high risk for heart disease.  Cancer screening Lung Cancer  Lung cancer screening is recommended for adults 46-79 years old who are at high risk for lung cancer because of a history of smoking.  A yearly low-dose CT scan of the lungs is recommended for people who: ? Currently smoke. ? Have quit within the past 15 years. ? Have at least a 30-pack-year history of smoking. A pack year is smoking an average of one pack of cigarettes a day for 1 year.  Yearly screening should continue until it has been 15 years since you quit.  Yearly screening should stop if you develop a health problem that would prevent you from having lung cancer treatment.  Breast Cancer  Practice breast self-awareness. This means understanding how your breasts normally appear and feel.  It also means doing regular breast self-exams. Let your health care provider know about any changes, no matter how small.  If you are in your 20s or 30s, you should have a clinical breast exam (CBE) by a health care provider every 1-3 years as part of a regular health exam.  If you are 46 or older, have a CBE every year. Also consider having a breast X-ray (mammogram) every year.  If you have a family history of breast cancer, talk to  your health care provider about genetic screening.  If you are at high risk for breast cancer, talk to your health care provider about having an MRI and a mammogram every year.  Breast cancer gene (BRCA) assessment is recommended for women who have family members with BRCA-related cancers. BRCA-related cancers include: ? Breast. ? Ovarian. ? Tubal. ? Peritoneal cancers.  Results of the assessment will determine the need for genetic counseling and BRCA1 and BRCA2 testing.  Cervical Cancer Your health care provider may recommend that you be screened  regularly for cancer of the pelvic organs (ovaries, uterus, and vagina). This screening involves a pelvic examination, including checking for microscopic changes to the surface of your cervix (Pap test). You may be encouraged to have this screening done every 3 years, beginning at age 46.  For women ages 9-65, health care providers may recommend pelvic exams and Pap testing every 3 years, or they may recommend the Pap and pelvic exam, combined with testing for human papilloma virus (HPV), every 5 years. Some types of HPV increase your risk of cervical cancer. Testing for HPV may also be done on women of any age with unclear Pap test results.  Other health care providers may not recommend any screening for nonpregnant women who are considered low risk for pelvic cancer and who do not have symptoms. Ask your health care provider if a screening pelvic exam is right for you.  If you have had past treatment for cervical cancer or a condition that could lead to cancer, you need Pap tests and screening for cancer for at least 20 years after your treatment. If Pap tests have been discontinued, your risk factors (such as having a new sexual partner) need to be reassessed to determine if screening should resume. Some women have medical problems that increase the chance of getting cervical cancer. In these cases, your health care provider may recommend more frequent screening and Pap tests.  Colorectal Cancer  This type of cancer can be detected and often prevented.  Routine colorectal cancer screening usually begins at 46 years of age and continues through 46 years of age.  Your health care provider may recommend screening at an earlier age if you have risk factors for colon cancer.  Your health care provider may also recommend using home test kits to check for hidden blood in the stool.  A small camera at the end of a tube can be used to examine your colon directly (sigmoidoscopy or colonoscopy). This is  done to check for the earliest forms of colorectal cancer.  Routine screening usually begins at age 46.  Direct examination of the colon should be repeated every 5-10 years through 46 years of age. However, you may need to be screened more often if early forms of precancerous polyps or small growths are found.  Skin Cancer  Check your skin from head to toe regularly.  Tell your health care provider about any new moles or changes in moles, especially if there is a change in a mole's shape or color.  Also tell your health care provider if you have a mole that is larger than the size of a pencil eraser.  Always use sunscreen. Apply sunscreen liberally and repeatedly throughout the day.  Protect yourself by wearing long sleeves, pants, a wide-brimmed hat, and sunglasses whenever you are outside.  Heart disease, diabetes, and high blood pressure  High blood pressure causes heart disease and increases the risk of stroke. High blood pressure is more likely  to develop in: ? People who have blood pressure in the high end of the normal range (130-139/85-89 mm Hg). ? People who are overweight or obese. ? People who are African American.  If you are 41-24 years of age, have your blood pressure checked every 3-5 years. If you are 31 years of age or older, have your blood pressure checked every year. You should have your blood pressure measured twice-once when you are at a hospital or clinic, and once when you are not at a hospital or clinic. Record the average of the two measurements. To check your blood pressure when you are not at a hospital or clinic, you can use: ? An automated blood pressure machine at a pharmacy. ? A home blood pressure monitor.  If you are between 45 years and 72 years old, ask your health care provider if you should take aspirin to prevent strokes.  Have regular diabetes screenings. This involves taking a blood sample to check your fasting blood sugar level. ? If you are  at a normal weight and have a low risk for diabetes, have this test once every three years after 46 years of age. ? If you are overweight and have a high risk for diabetes, consider being tested at a younger age or more often. Preventing infection Hepatitis B  If you have a higher risk for hepatitis B, you should be screened for this virus. You are considered at high risk for hepatitis B if: ? You were born in a country where hepatitis B is common. Ask your health care provider which countries are considered high risk. ? Your parents were born in a high-risk country, and you have not been immunized against hepatitis B (hepatitis B vaccine). ? You have HIV or AIDS. ? You use needles to inject street drugs. ? You live with someone who has hepatitis B. ? You have had sex with someone who has hepatitis B. ? You get hemodialysis treatment. ? You take certain medicines for conditions, including cancer, organ transplantation, and autoimmune conditions.  Hepatitis C  Blood testing is recommended for: ? Everyone born from [birth year] through 1965. ? Anyone with known risk factors for hepatitis C.  Sexually transmitted infections (STIs)  You should be screened for sexually transmitted infections (STIs) including gonorrhea and chlamydia if: ? You are sexually active and are younger than 46 years of age. ? You are older than 46 years of age and your health care provider tells you that you are at risk for this type of infection. ? Your sexual activity has changed since you were last screened and you are at an increased risk for chlamydia or gonorrhea. Ask your health care provider if you are at risk.  If you do not have HIV, but are at risk, it may be recommended that you take a prescription medicine daily to prevent HIV infection. This is called pre-exposure prophylaxis (PrEP). You are considered at risk if: ? You are sexually active and do not regularly use condoms or know the HIV status of your  partner(s). ? You take drugs by injection. ? You are sexually active with a partner who has HIV.  Talk with your health care provider about whether you are at high risk of being infected with HIV. If you choose to begin PrEP, you should first be tested for HIV. You should then be tested every 3 months for as long as you are taking PrEP. Pregnancy  If you are premenopausal and you may become  pregnant, ask your health care provider about preconception counseling.  If you may become pregnant, take 400 to 800 micrograms (mcg) of folic acid every day.  If you want to prevent pregnancy, talk to your health care provider about birth control (contraception). Osteoporosis and menopause  Osteoporosis is a disease in which the bones lose minerals and strength with aging. This can result in serious bone fractures. Your risk for osteoporosis can be identified using a bone density scan.  If you are 72 years of age or older, or if you are at risk for osteoporosis and fractures, ask your health care provider if you should be screened.  Ask your health care provider whether you should take a calcium or vitamin D supplement to lower your risk for osteoporosis.  Menopause may have certain physical symptoms and risks.  Hormone replacement therapy may reduce some of these symptoms and risks. Talk to your health care provider about whether hormone replacement therapy is right for you. Follow these instructions at home:  Schedule regular health, dental, and eye exams.  Stay current with your immunizations.  Do not use any tobacco products including cigarettes, chewing tobacco, or electronic cigarettes.  If you are pregnant, do not drink alcohol.  If you are breastfeeding, limit how much and how often you drink alcohol.  Limit alcohol intake to no more than 1 drink per day for nonpregnant women. One drink equals 12 ounces of beer, 5 ounces of wine, or 1 ounces of hard liquor.  Do not use street  drugs.  Do not share needles.  Ask your health care provider for help if you need support or information about quitting drugs.  Tell your health care provider if you often feel depressed.  Tell your health care provider if you have ever been abused or do not feel safe at home. This information is not intended to replace advice given to you by your health care provider. Make sure you discuss any questions you have with your health care provider. Document Released: 06/20/2011 Document Revised: 05/12/2016 Document Reviewed: 09/08/2015 Elsevier Interactive Patient Education  [employer].

## 2017-05-29 NOTE — Progress Notes (Signed)
Subjective:    Patient ID: [name], female    DOB: [birth year], 46 y.o.   MRN: [id number]  CC: [name] is a 46 y.o. female who presents today for physical exam.    HPI: Going on cruise and would like motion sickness patches.   HTN- on hctz more for swelling in legs. Denies exertional chest pain or pressure, numbness or tingling radiating to left arm or jaw, palpitations, dizziness, frequent headaches, changes in vision, or shortness of breath.      Colorectal Cancer Screening: No early family history Breast Cancer Screening: Mammogram due; follows with [facility]; h/o biopsy. Mammogram scheduled per patient 05/2017 Cervical Cancer Screening: due; [facility] for heavy bleeding.  Bone Health screening/DEXA for 65+: No increased fracture risk. Defer screening at this time. Lung Cancer Screening: Doesn't have 30 year pack year history and age > 9 years.       Tetanus - utd Labs: Screening labs today. Exercise: Gets regular exercise.  Alcohol use: Occasional Smoking/tobacco use: Nonsmoker.  Regular dental exams: UTD Wears seat belt: Yes. Skin: no new skin lesions; no h/o skin cancer  HISTORY:  Past Medical History:  Diagnosis Date  . Blood in urine 2014  . Breast mass 2013  . BV (bacterial vaginosis)   . Edema    legs  . Hypertension   . Thyroid disease     Past Surgical History:  Procedure Laterality Date  . BREAST BIOPSY Right 2013   benign - fatty tissue  . BREAST BIOPSY Left 06-12-13   fibroadenoma /core biopsy  . BREAST BIOPSY Right 06-17-13   fibrocystic /stereo biopsy  . CESAREAN SECTION  2007  . DILATION AND CURETTAGE OF UTERUS    . FOOT SURGERY Left    01/2017  . ROUX-EN-Y PROCEDURE  03/04/2014   Dr. [place]  . TUBAL LIGATION     Family History  Problem Relation Age of Onset  . Arthritis Mother   . Diabetes Father   . Hypertension Father   . Polycystic ovary syndrome Sister   . Cancer Maternal Grandmother 59       breast  .  Breast cancer Neg Hx   . Ovarian cancer Neg Hx       ALLERGIES: Tape and Avelox [moxifloxacin hcl in nacl]  Current Outpatient Prescriptions on File Prior to Visit  Medication Sig Dispense Refill  . acetaminophen (TYLENOL) 500 MG tablet Take 500 mg by mouth every 6 (six) hours as needed.    . hydrochlorothiazide (HYDRODIURIL) 25 MG tablet Take 1 tablet (25 mg total) by mouth daily. 90 tablet 3  . norethindrone (AYGESTIN) 5 MG tablet TAKE THREE TABLETS BY MOUTH EVERY DAY 90 tablet 0   No current facility-administered medications on file prior to visit.     Social History  Substance Use Topics  . Smoking status: Former Research scientist (life sciences)  . Smokeless tobacco: Never Used     Comment: quit 2006  . Alcohol use Yes     Comment: rare    Review of Systems  Constitutional: Negative for chills, fever and unexpected weight change.  HENT: Negative for congestion.   Respiratory: Negative for cough.   Cardiovascular: Negative for chest pain, palpitations and leg swelling.  Gastrointestinal: Negative for nausea and vomiting.  Musculoskeletal: Negative for arthralgias and myalgias.  Skin: Negative for rash.  Neurological: Negative for headaches.  Hematological: Negative for adenopathy.  Psychiatric/Behavioral: Negative for confusion.      Objective:    BP 120/76  Pulse 81   Temp 98.7 F (37.1 C) (Oral)   Ht 5\' 6"  (1.676 m)   Wt 254 lb 6.4 oz (115.4 kg)   SpO2 97%   BMI 41.06 kg/m   BP Readings from Last 3 Encounters:  05/29/17 120/76  04/18/17 112/67  03/30/17 109/69   Wt Readings from Last 3 Encounters:  05/29/17 254 lb 6.4 oz (115.4 kg)  04/18/17 250 lb (113.4 kg)  03/30/17 257 lb 3.2 oz (116.7 kg)    Physical Exam  Constitutional: She appears well-developed and well-nourished.  Eyes: Conjunctivae are normal.  Neck: No thyroid mass and no thyromegaly present.  Cardiovascular: Normal rate, regular rhythm, normal heart sounds and normal pulses.   Pulmonary/Chest: Effort normal  and breath sounds normal. She has no wheezes. She has no rhonchi. She has no rales. Right breast exhibits no inverted nipple, no mass, no nipple discharge, no skin change and no tenderness. Left breast exhibits no inverted nipple, no mass, no nipple discharge, no skin change and no tenderness. Breasts are symmetrical.  CBE performed.   Lymphadenopathy:       Head (right side): No submental, no submandibular, no tonsillar, no preauricular, no posterior auricular and no occipital adenopathy present.       Head (left side): No submental, no submandibular, no tonsillar, no preauricular, no posterior auricular and no occipital adenopathy present.    She has no cervical adenopathy.       Right cervical: No superficial cervical, no deep cervical and no posterior cervical adenopathy present.      Left cervical: No superficial cervical, no deep cervical and no posterior cervical adenopathy present.    She has no axillary adenopathy.  Neurological: She is alert.  Skin: Skin is warm and dry.  Psychiatric: She has a normal mood and affect. Her speech is normal and behavior is normal. Thought content normal.  Vitals reviewed.      Assessment & Plan:   Problem List Items Addressed This Visit      Cardiovascular and Mediastinum   Hypertension    Controlled. Continue current regimen        Other   Routine general medical examination at a health care facility - Primary    Clinical breast exam performed today. Screening labs ordered. Patient follows with OB/GYN. Encouraged exercise. Referral to derm for annual skin check.       Relevant Orders   CBC with Differential/Platelet   Comprehensive metabolic panel   Hemoglobin A1c   Lipid panel   TSH   VITAMIN D 25 Hydroxy (Vit-D Deficiency, Fractures)   Ambulatory referral to Dermatology    Other Visit Diagnoses    Motion sickness, initial encounter       Relevant Medications   scopolamine (TRANSDERM-SCOP) 1 MG/3DAYS       I am having Ms.  [name] start on scopolamine. I am also having her maintain her hydrochlorothiazide, acetaminophen, and norethindrone.   Meds ordered this encounter  Medications  . scopolamine (TRANSDERM-SCOP) 1 MG/3DAYS    Sig: Place 1 patch (1.5 mg total) onto the skin every 3 (three) days. Start >4 hours prior to event.    Dispense:  10 patch    Refill:  0    Order Specific Question:   Supervising Provider    Answer:   Crecencio Mc [[id number]]    Return precautions given.   Risks, benefits, and alternatives of the medications and treatment plan prescribed today were discussed, and patient expressed understanding.   Education  regarding symptom management and diagnosis given to patient on AVS.   Continue to follow with Burnard Hawthorne, FNP for routine health maintenance.   Ladoris Gene and I agreed with plan.   Mable Paris, FNP

## 2017-06-14 DIAGNOSIS — Z1231 Encounter for screening mammogram for malignant neoplasm of breast: Secondary | ICD-10-CM | POA: Diagnosis not present

## 2017-06-15 ENCOUNTER — Ambulatory Visit (INDEPENDENT_AMBULATORY_CARE_PROVIDER_SITE_OTHER): Payer: BLUE CROSS/BLUE SHIELD | Admitting: Family Medicine

## 2017-06-15 ENCOUNTER — Encounter: Payer: Self-pay | Admitting: General Surgery

## 2017-06-15 ENCOUNTER — Encounter: Payer: Self-pay | Admitting: Family Medicine

## 2017-06-15 DIAGNOSIS — J0100 Acute maxillary sinusitis, unspecified: Secondary | ICD-10-CM

## 2017-06-15 DIAGNOSIS — J019 Acute sinusitis, unspecified: Secondary | ICD-10-CM | POA: Insufficient documentation

## 2017-06-15 MED ORDER — AMOXICILLIN-POT CLAVULANATE 875-125 MG PO TABS: 1.0000 | ORAL_TABLET | Freq: Two times a day (BID) | ORAL | 0 refills | Status: DC

## 2017-06-15 NOTE — Assessment & Plan Note (Signed)
New acute problem. Treating with Augmentin. 

## 2017-06-15 NOTE — Patient Instructions (Addendum)
Antibiotic as prescribed.  Feel better.  Take care  Dr. Jaiyanna Safran  

## 2017-06-15 NOTE — Progress Notes (Signed)
Subjective:  Patient ID: [name], female    DOB: 08/14/1971  Age: 46 y.o. MRN: [id number]  CC: Sinus congestion  HPI:  46 year old female presents with the above complaint.  Patient reports she's been sick since 6/13. She's had severe sinus pressure/congestion, ear fullness, dizziness, and headache. She thought that this was viral and would improve on its own. She has been using Flonase without improvement. No fever. No reports of purulent nasal discharge. No known exacerbating factors. No other associated symptoms. No other complaints this time.  Social Hx   Social History   Social History  . Marital status: Married    Spouse name: N/A  . Number of children: N/A  . Years of education: N/A   Social History Main Topics  . Smoking status: Former Research scientist (life sciences)  . Smokeless tobacco: Never Used     Comment: quit 2006  . Alcohol use Yes     Comment: rare  . Drug use: No  . Sexual activity: Yes    Birth control/ protection: Surgical   Other Topics Concern  . None   Social History Narrative   Married.   Two children- daughter and son. Daughter at [employer] and thinking about medical profession.           Review of Systems  Constitutional: Negative for fever.  HENT: Positive for congestion, sinus pain and sinus pressure.   Respiratory: Negative for cough and shortness of breath.   Neurological: Positive for dizziness and headaches.   Objective:  BP 108/70 (BP Location: Left Arm, Patient Position: Sitting, Cuff Size: Large)   Pulse 82   Temp 99.1 F (37.3 C) (Oral)   Wt 251 lb 8 oz (114.1 kg)   SpO2 98%   BMI 40.59 kg/m   BP/Weight 06/15/2017 0/93/2355 06/20/2201  Systolic BP 542 706 237  Diastolic BP 70 76 67  Wt. (Lbs) 251.5 254.4 250  BMI 40.59 41.06 40.35   Physical Exam  Constitutional: She is oriented to person, place, and time. She appears well-developed. No distress.  HENT:  Head: Normocephalic and atraumatic.  Normal TMs bilaterally. Significant  maxillary sinus tenderness to palpation.  Eyes: Conjunctivae are normal. Right eye exhibits no discharge. Left eye exhibits no discharge. No scleral icterus.  Neck: Neck supple.  Cardiovascular: Normal rate and regular rhythm.   No murmur heard. Pulmonary/Chest: Effort normal. She has no wheezes. She has no rales.  Neurological: She is alert and oriented to person, place, and time.  Psychiatric: She has a normal mood and affect.  Vitals reviewed.   Lab Results  Component Value Date   WBC 8.9 09/07/2016   HGB 13.6 09/07/2016   HCT 39.4 09/07/2016   PLT 277.0 09/07/2016   GLUCOSE 84 08/18/2016   CHOL 145 05/26/2016   TRIG 110.0 05/26/2016   HDL 47.30 05/26/2016   LDLCALC 76 05/26/2016   ALT 11 08/18/2016   AST 15 08/18/2016   NA 140 08/18/2016   K 4.0 08/18/2016   CL 104 08/18/2016   CREATININE 0.69 08/18/2016   BUN 14 08/18/2016   CO2 31 08/18/2016   TSH 0.65 05/26/2016   HGBA1C 4.9 05/26/2016   MICROALBUR <0.7 05/26/2016    Assessment & Plan:   Problem List Items Addressed This Visit      Respiratory   Acute sinusitis    New acute problem. Treating with Augmentin.       Relevant Medications   amoxicillin-clavulanate (AUGMENTIN) 875-125 MG tablet     Meds ordered this  encounter  Medications  . amoxicillin-clavulanate (AUGMENTIN) 875-125 MG tablet    Sig: Take 1 tablet by mouth 2 (two) times daily.    Dispense:  20 tablet    Refill:  0   Follow-up: PRN  [place]

## 2017-06-19 DIAGNOSIS — M76822 Posterior tibial tendinitis, left leg: Secondary | ICD-10-CM | POA: Diagnosis not present

## 2017-06-20 ENCOUNTER — Other Ambulatory Visit: Payer: BLUE CROSS/BLUE SHIELD

## 2017-06-22 ENCOUNTER — Other Ambulatory Visit: Payer: BLUE CROSS/BLUE SHIELD

## 2017-06-27 ENCOUNTER — Encounter: Payer: Self-pay | Admitting: General Surgery

## 2017-06-27 ENCOUNTER — Ambulatory Visit (INDEPENDENT_AMBULATORY_CARE_PROVIDER_SITE_OTHER): Payer: BLUE CROSS/BLUE SHIELD | Admitting: General Surgery

## 2017-06-27 VITALS — BP 130/74 | HR 74 | Resp 12 | Ht 68.0 in | Wt 251.0 lb

## 2017-06-27 DIAGNOSIS — N6012 Diffuse cystic mastopathy of left breast: Secondary | ICD-10-CM

## 2017-06-27 DIAGNOSIS — I8393 Asymptomatic varicose veins of bilateral lower extremities: Secondary | ICD-10-CM | POA: Diagnosis not present

## 2017-06-27 NOTE — Patient Instructions (Signed)
Patient to return to her [facility] for year mammograms and breast checks. The patient is aware to call back for any questions or concerns.

## 2017-06-27 NOTE — Progress Notes (Signed)
Patient ID: CHRISTIANN HAGERTY, female   DOB: [birth year], 46 y.o.   MRN: [id number]  Chief Complaint  Patient presents with  . Follow-up    mammogram    HPI JOZIE WULF is a 46 y.o. female who presents for a breast evaluation. The most recent mammogram was done on 06/12/2017.  Patient does perform regular self breast checks and gets regular mammograms done.    HPI  Past Medical History:  Diagnosis Date  . Blood in urine 2014  . Breast mass 2013  . BV (bacterial vaginosis)   . Edema    legs  . Hypertension   . Thyroid disease     Past Surgical History:  Procedure Laterality Date  . BREAST BIOPSY Right 2013   benign - fatty tissue  . BREAST BIOPSY Left 06-12-13   fibroadenoma /core biopsy  . BREAST BIOPSY Right 06-17-13   fibrocystic /stereo biopsy  . CESAREAN SECTION  2007  . DILATION AND CURETTAGE OF UTERUS    . FOOT SURGERY Left    01/2017  . ROUX-EN-Y PROCEDURE  03/04/2014   Dr. [place]  . TUBAL LIGATION      Family History  Problem Relation Age of Onset  . Arthritis Mother   . Diabetes Father   . Hypertension Father   . Polycystic ovary syndrome Sister   . Cancer Maternal Grandmother 60       breast  . Breast cancer Neg Hx   . Ovarian cancer Neg Hx     Social History Social History  Substance Use Topics  . Smoking status: Former Research scientist (life sciences)  . Smokeless tobacco: Never Used     Comment: quit 2006  . Alcohol use Yes     Comment: rare    Allergies  Allergen Reactions  . Tape Other (See Comments)    Blistering, ok with paper tape  . Avelox [Moxifloxacin Hcl In Nacl] Palpitations    Current Outpatient Prescriptions  Medication Sig Dispense Refill  . hydrochlorothiazide (HYDRODIURIL) 25 MG tablet Take 1 tablet (25 mg total) by mouth daily. 90 tablet 3  . norethindrone (AYGESTIN) 5 MG tablet TAKE THREE TABLETS BY MOUTH EVERY DAY 90 tablet 0   No current facility-administered medications for this visit.     Review of Systems Review of Systems    Constitutional: Negative.   Respiratory: Negative.   Cardiovascular: Negative.     Blood pressure 130/74, pulse 74, resp. rate 12, height 5\' 8"  (1.727 m), weight 251 lb (113.9 kg), last menstrual period 06/19/2017.  Physical Exam Physical Exam  Constitutional: She is oriented to person, place, and time. She appears well-developed and well-nourished.  Eyes: Conjunctivae are normal. No scleral icterus.  Neck: Neck supple.  Cardiovascular: Normal rate, regular rhythm and normal heart sounds.   Pulses:      Dorsalis pedis pulses are 2+ on the right side, and 2+ on the left side.       Posterior tibial pulses are 2+ on the right side, and 2+ on the left side.  VV on both legs as noted before. No edema or skin changes  Pulmonary/Chest: Effort normal and breath sounds normal. Right breast exhibits no inverted nipple, no mass, no nipple discharge, no skin change and no tenderness. Left breast exhibits no inverted nipple, no nipple discharge, no skin change and no tenderness.    Abdominal: Soft. Bowel sounds are normal. There is no tenderness.  Lymphadenopathy:    She has no cervical adenopathy.    She has no  axillary adenopathy.  Neurological: She is alert and oriented to person, place, and time.  Skin: Skin is warm and dry.    Data Reviewed Mammogram reviewed -stable  Assessment    History of FCD, fibroadenoma by prior biopsies.  VV asymptomatic    Plan    Patient to return to her PCP for yearly mammograms and breast checks. The patient is aware to call back for any questions or concerns. Advised again on need for use of compression hose  HPI, Physical Exam, Assessment and Plan have been scribed under the direction and in the presence of Mckinley Jewel, MD  Gaspar Cola, CMA     I have completed the exam and reviewed the above documentation for accuracy and completeness.  I agree with the above.  Haematologist has been used and any errors in dictation or transcription  are unintentional.  Glendene Wyer G. Jamal Collin, M.D., F.A.C.S.   Junie Panning G 06/27/2017, 2:05 PM

## 2017-07-01 DIAGNOSIS — R21 Rash and other nonspecific skin eruption: Secondary | ICD-10-CM | POA: Diagnosis not present

## 2017-07-07 ENCOUNTER — Other Ambulatory Visit (INDEPENDENT_AMBULATORY_CARE_PROVIDER_SITE_OTHER): Payer: BLUE CROSS/BLUE SHIELD

## 2017-07-07 DIAGNOSIS — Z0000 Encounter for general adult medical examination without abnormal findings: Secondary | ICD-10-CM | POA: Diagnosis not present

## 2017-07-07 LAB — COMPREHENSIVE METABOLIC PANEL
ALT: 13 U/L (ref 0–35)
AST: 17 U/L (ref 0–37)
Albumin: 3.8 g/dL (ref 3.5–5.2)
Alkaline Phosphatase: 95 U/L (ref 39–117)
BUN: 13 mg/dL (ref 6–23)
CO2: 30 mEq/L (ref 19–32)
Calcium: 9 mg/dL (ref 8.4–10.5)
Chloride: 104 mEq/L (ref 96–112)
Creatinine, Ser: 0.72 mg/dL (ref 0.40–1.20)
GFR: 92.6 mL/min (ref >60.00)
Glucose, Bld: 91 mg/dL (ref 70–99)
Potassium: 3.8 mEq/L (ref 3.5–5.1)
Sodium: 140 mEq/L (ref 135–145)
Total Bilirubin: 0.5 mg/dL (ref 0.2–1.2)
Total Protein: 6.3 g/dL (ref 6.0–8.3)

## 2017-07-07 LAB — LIPID PANEL
Cholesterol: 140 mg/dL (ref 0–200)
HDL: 56.4 mg/dL (ref >39.00)
LDL Cholesterol: 63 mg/dL (ref 0–99)
NonHDL: 83.38
Total CHOL/HDL Ratio: 2
Triglycerides: 100 mg/dL (ref 0.0–149.0)
VLDL: 20 mg/dL (ref 0.0–40.0)

## 2017-07-07 LAB — CBC WITH DIFFERENTIAL/PLATELET
Basophils Absolute: 0.1 10*3/uL (ref 0.0–0.1)
Basophils Relative: 0.7 % (ref 0.0–3.0)
Eosinophils Absolute: 0.3 10*3/uL (ref 0.0–0.7)
Eosinophils Relative: 3.1 % (ref 0.0–5.0)
HCT: 38.7 % (ref 36.0–46.0)
Hemoglobin: 12.8 g/dL (ref 12.0–15.0)
Lymphocytes Relative: 30.6 % (ref 12.0–46.0)
Lymphs Abs: 2.5 10*3/uL (ref 0.7–4.0)
MCHC: 33.2 g/dL (ref 30.0–36.0)
MCV: 87.6 fl (ref 78.0–100.0)
Monocytes Absolute: 0.5 10*3/uL (ref 0.1–1.0)
Monocytes Relative: 6 % (ref 3.0–12.0)
Neutro Abs: 4.9 10*3/uL (ref 1.4–7.7)
Neutrophils Relative %: 59.6 % (ref 43.0–77.0)
Platelets: 251 10*3/uL (ref 150.0–400.0)
RBC: 4.41 Mil/uL (ref 3.87–5.11)
RDW: 14.3 % (ref 11.5–15.5)
WBC: 8.1 10*3/uL (ref 4.0–10.5)

## 2017-07-07 LAB — TSH: TSH: 1.35 u[IU]/mL (ref 0.35–4.50)

## 2017-07-07 LAB — VITAMIN D 25 HYDROXY (VIT D DEFICIENCY, FRACTURES): VITD: 33.1 ng/mL (ref 30.00–100.00)

## 2017-07-07 LAB — HEMOGLOBIN A1C: Hgb A1c MFr Bld: 5 % (ref 4.6–6.5)

## 2017-07-25 ENCOUNTER — Encounter: Payer: BLUE CROSS/BLUE SHIELD | Admitting: Obstetrics and Gynecology

## 2017-08-08 ENCOUNTER — Encounter: Payer: Self-pay | Admitting: Obstetrics and Gynecology

## 2017-08-08 ENCOUNTER — Ambulatory Visit (INDEPENDENT_AMBULATORY_CARE_PROVIDER_SITE_OTHER): Payer: BLUE CROSS/BLUE SHIELD | Admitting: Obstetrics and Gynecology

## 2017-08-08 VITALS — BP 108/67 | HR 82 | Ht 68.0 in | Wt 256.6 lb

## 2017-08-08 DIAGNOSIS — Z01419 Encounter for gynecological examination (general) (routine) without abnormal findings: Secondary | ICD-10-CM

## 2017-08-08 DIAGNOSIS — Z9851 Tubal ligation status: Secondary | ICD-10-CM | POA: Diagnosis not present

## 2017-08-08 DIAGNOSIS — E669 Obesity, unspecified: Secondary | ICD-10-CM

## 2017-08-08 NOTE — Progress Notes (Signed)
ANNUAL PREVENTATIVE CARE GYN  ENCOUNTER NOTE  Subjective:       [name] is a 46 y.o. [phone] female here for a routine annual gynecologic exam.  Current complaints: 1.  None  No major interval health issues. Patient has had regulation of menorrhagia with regular cycle after being treated with norethindrone acetate. Menses are occurring monthly and lasting less than 7 days. Patient reports having midcycle discomfort consistent with mittelschmerz. She is not having any significant dysmenorrhea.  Bowel function is normal. Bladder function is normal.    Gynecologic History Patient's last menstrual period was 07/15/2017 (exact date). Contraception: tubal ligation Last Pap:2015 wnl. Results were: normal Last mammogram: 2018 wnl. Results were: normal  Obstetric History OB History  Gravida Para Term Preterm AB Living  3 2 2   1 2   SAB TAB Ectopic Multiple Live Births  1       2    # Outcome Date GA Lbr Len/2nd Weight Sex Delivery Anes PTL Lv  3 Term 2007   9 lb 1.8 oz (4.132 kg) M CS-LTranv   LIV  2 SAB 2005          1 Term 1999   8 lb 8 oz (3.856 kg) F VBAC   LIV      Past Medical History:  Diagnosis Date  . Blood in urine 2014  . Breast mass 2013  . BV (bacterial vaginosis)   . Edema    legs  . Hypertension   . Thyroid disease     Past Surgical History:  Procedure Laterality Date  . BREAST BIOPSY Right 2013   benign - fatty tissue  . BREAST BIOPSY Left 06-12-13   fibroadenoma /core biopsy  . BREAST BIOPSY Right 06-17-13   fibrocystic /stereo biopsy  . CESAREAN SECTION  2007  . DILATION AND CURETTAGE OF UTERUS    . FOOT SURGERY Left    01/2017  . ROUX-EN-Y PROCEDURE  03/04/2014   Dr. [place]  . TUBAL LIGATION      Current Outpatient Prescriptions on File Prior to Visit  Medication Sig Dispense Refill  . hydrochlorothiazide (HYDRODIURIL) 25 MG tablet Take 1 tablet (25 mg total) by mouth daily. 90 tablet 3   No current facility-administered medications on  file prior to visit.     Allergies  Allergen Reactions  . Tape Other (See Comments)    Blistering, ok with paper tape  . Avelox [Moxifloxacin Hcl In Nacl] Palpitations    Social History   Social History  . Marital status: Married    Spouse name: N/A  . Number of children: N/A  . Years of education: N/A   Occupational History  . Not on file.   Social History Main Topics  . Smoking status: Former Research scientist (life sciences)  . Smokeless tobacco: Never Used     Comment: quit 2006  . Alcohol use Yes     Comment: rare  . Drug use: No  . Sexual activity: Yes    Birth control/ protection: Surgical   Other Topics Concern  . Not on file   Social History Narrative   Married.   Two children- daughter and son. Daughter at [employer] and thinking about medical profession.           Family History  Problem Relation Age of Onset  . Arthritis Mother   . Diabetes Father   . Hypertension Father   . Polycystic ovary syndrome Sister   . Cancer Maternal Grandmother 74  breast  . Breast cancer Neg Hx   . Ovarian cancer Neg Hx     The following portions of the patient's history were reviewed and updated as appropriate: allergies, current medications, past family history, past medical history, past social history, past surgical history and problem list.  Review of Systems ROS Review of Systems - General ROS: negative for - chills, fatigue, fever, hot flashes, night sweats, weight gain or weight loss Psychological ROS: negative for - anxiety, decreased libido, depression, mood swings, physical abuse or sexual abuse Ophthalmic ROS: negative for - blurry vision, eye pain or loss of vision ENT ROS: negative for - headaches, hearing change, visual changes or vocal changes Allergy and Immunology ROS: negative for - hives, itchy/watery eyes or seasonal allergies Hematological and Lymphatic ROS: negative for - bleeding problems, bruising, swollen lymph nodes or weight loss Endocrine ROS: negative for -  galactorrhea, hair pattern changes, hot flashes, malaise/lethargy, mood swings, palpitations, polydipsia/polyuria, skin changes, temperature intolerance or unexpected weight changes Breast ROS: negative for - new or changing breast lumps or nipple discharge Respiratory ROS: negative for - cough or shortness of breath Cardiovascular ROS: negative for - chest pain, irregular heartbeat, palpitations or shortness of breath Gastrointestinal ROS: no abdominal pain, change in bowel habits, or black or bloody stools Genito-Urinary ROS: no dysuria, trouble voiding, or hematuria Musculoskeletal ROS: negative for - joint pain or joint stiffness Neurological ROS: negative for - bowel and bladder control changes Dermatological ROS: negative for rash and skin lesion changes   Objective:   BP 108/67   Pulse 82   Ht 5\' 8"  (1.727 m)   Wt 256 lb 9.6 oz (116.4 kg)   LMP 07/15/2017 (Exact Date)   BMI 39.02 kg/m  CONSTITUTIONAL: Well-developed, well-nourished female in no acute distress.  PSYCHIATRIC: Normal mood and affect. Normal behavior. Normal judgment and thought content. [place]: Alert and oriented to person, place, and time. Normal muscle tone coordination. No cranial nerve deficit noted. HENT:  Normocephalic, atraumatic, External right and left ear normal. Oropharynx is clear and moist EYES: Conjunctivae and EOM are normal. Pupils are equal, round, and reactive to light. No scleral icterus.  NECK: Normal range of motion, supple, no masses.  Normal thyroid.  SKIN: Skin is warm and dry. No rash noted. Not diaphoretic. No erythema. No pallor. CARDIOVASCULAR: Normal heart rate noted, regular rhythm, no murmur. RESPIRATORY: Clear to auscultation bilaterally. Effort and breath sounds normal, no problems with respiration noted. BREASTS: Symmetric in size. No masses, skin changes, nipple drainage, or lymphadenopathy. ABDOMEN: Soft, normal bowel sounds, no distention noted.  No tenderness, rebound or  guarding.  BLADDER: Normal PELVIC:  External Genitalia: Normal  BUS: Normal  Vagina: Normal  Cervix: Normal; eversion is present  Uterus: Normal; normal size shape, midplane, nontender  Adnexa: Normal  RV: External Exam NormaI, No Rectal Masses and Normal Sphincter tone  MUSCULOSKELETAL: Normal range of motion. No tenderness.  No cyanosis, clubbing, or edema.  2+ distal pulses. LYMPHATIC: No Axillary, Supraclavicular, or Inguinal Adenopathy.    Assessment:   Annual gynecologic examination 46 y.o. Contraception: tubal ligation bmi 39 Problem List Items Addressed This Visit    None      Plan:  Pap: Pap Co Test Mammogram: utd Dr Jamal Collin Stool Guaiac Testing:  Not Indicated Labs: thru pcp Routine preventative health maintenance measures emphasized: Exercise/Diet/Weight control, Tobacco Warnings and Alcohol/Substance use risks; eversion is present Return to [place], CMA  Brayton Mars, MD  Note: This dictation was prepared with Dragon dictation along with smaller phrase technology. Any transcriptional errors that result from this process are unintentional. ;

## 2017-08-08 NOTE — Patient Instructions (Addendum)
1. Pap smear is done 2. Mammograms are completed this year 3. Screening labs are to be obtained through primary care 4. Continue with healthy eating, exercise, controlled weight loss 5. Continue monitoring for any abnormal bleeding. 6. Return in 1 year for annual exam  Health Maintenance, Female Adopting a healthy lifestyle and getting preventive care can go a long way to promote health and wellness. Talk with your health care provider about what schedule of regular examinations is right for you. This is a good chance for you to check in with your provider about disease prevention and staying healthy. In between checkups, there are plenty of things you can do on your own. Experts have done a lot of research about which lifestyle changes and preventive measures are most likely to keep you healthy. Ask your health care provider for more information. Weight and diet Eat a healthy diet  Be sure to include plenty of vegetables, fruits, low-fat dairy products, and lean protein.  Do not eat a lot of foods high in solid fats, added sugars, or salt.  Get regular exercise. This is one of the most important things you can do for your health. ? Most adults should exercise for at least 150 minutes each week. The exercise should increase your heart rate and make you sweat (moderate-intensity exercise). ? Most adults should also do strengthening exercises at least twice a week. This is in addition to the moderate-intensity exercise.  Maintain a healthy weight  Body mass index (BMI) is a measurement that can be used to identify possible weight problems. It estimates body fat based on height and weight. Your health care provider can help determine your BMI and help you achieve or maintain a healthy weight.  For females 16 years of age and older: ? A BMI below 18.5 is considered underweight. ? A BMI of 18.5 to 24.9 is normal. ? A BMI of 25 to 29.9 is considered overweight. ? A BMI of 30 and above is  considered obese.  Watch levels of cholesterol and blood lipids  You should start having your blood tested for lipids and cholesterol at 46 years of age, then have this test every 5 years.  You may need to have your cholesterol levels checked more often if: ? Your lipid or cholesterol levels are high. ? You are older than 46 years of age. ? You are at high risk for heart disease.  Cancer screening Lung Cancer  Lung cancer screening is recommended for adults 20-64 years old who are at high risk for lung cancer because of a history of smoking.  A yearly low-dose CT scan of the lungs is recommended for people who: ? Currently smoke. ? Have quit within the past 15 years. ? Have at least a 30-pack-year history of smoking. A pack year is smoking an average of one pack of cigarettes a day for 1 year.  Yearly screening should continue until it has been 15 years since you quit.  Yearly screening should stop if you develop a health problem that would prevent you from having lung cancer treatment.  Breast Cancer  Practice breast self-awareness. This means understanding how your breasts normally appear and feel.  It also means doing regular breast self-exams. Let your health care provider know about any changes, no matter how small.  If you are in your 20s or 30s, you should have a clinical breast exam (CBE) by a health care provider every 1-3 years as part of a regular health exam.  If you are 40 or older, have a CBE every year. Also consider having a breast X-ray (mammogram) every year.  If you have a family history of breast cancer, talk to your health care provider about genetic screening.  If you are at high risk for breast cancer, talk to your health care provider about having an MRI and a mammogram every year.  Breast cancer gene (BRCA) assessment is recommended for women who have family members with BRCA-related cancers. BRCA-related cancers  include: ? Breast. ? Ovarian. ? Tubal. ? Peritoneal cancers.  Results of the assessment will determine the need for genetic counseling and BRCA1 and BRCA2 testing.  Cervical Cancer Your health care provider may recommend that you be screened regularly for cancer of the pelvic organs (ovaries, uterus, and vagina). This screening involves a pelvic examination, including checking for microscopic changes to the surface of your cervix (Pap test). You may be encouraged to have this screening done every 3 years, beginning at age 21.  For women ages 30-65, health care providers may recommend pelvic exams and Pap testing every 3 years, or they may recommend the Pap and pelvic exam, combined with testing for human papilloma virus (HPV), every 5 years. Some types of HPV increase your risk of cervical cancer. Testing for HPV may also be done on women of any age with unclear Pap test results.  Other health care providers may not recommend any screening for nonpregnant women who are considered low risk for pelvic cancer and who do not have symptoms. Ask your health care provider if a screening pelvic exam is right for you.  If you have had past treatment for cervical cancer or a condition that could lead to cancer, you need Pap tests and screening for cancer for at least 20 years after your treatment. If Pap tests have been discontinued, your risk factors (such as having a new sexual partner) need to be reassessed to determine if screening should resume. Some women have medical problems that increase the chance of getting cervical cancer. In these cases, your health care provider may recommend more frequent screening and Pap tests.  Colorectal Cancer  This type of cancer can be detected and often prevented.  Routine colorectal cancer screening usually begins at 46 years of age and continues through 46 years of age.  Your health care provider may recommend screening at an earlier age if you have risk factors  for colon cancer.  Your health care provider may also recommend using home test kits to check for hidden blood in the stool.  A small camera at the end of a tube can be used to examine your colon directly (sigmoidoscopy or colonoscopy). This is done to check for the earliest forms of colorectal cancer.  Routine screening usually begins at age 50.  Direct examination of the colon should be repeated every 5-10 years through 46 years of age. However, you may need to be screened more often if early forms of precancerous polyps or small growths are found.  Skin Cancer  Check your skin from head to toe regularly.  Tell your health care provider about any new moles or changes in moles, especially if there is a change in a mole's shape or color.  Also tell your health care provider if you have a mole that is larger than the size of a pencil eraser.  Always use sunscreen. Apply sunscreen liberally and repeatedly throughout the day.  Protect yourself by wearing long sleeves, pants, a wide-brimmed hat, and   sunglasses whenever you are outside.  Heart disease, diabetes, and high blood pressure  High blood pressure causes heart disease and increases the risk of stroke. High blood pressure is more likely to develop in: ? People who have blood pressure in the high end of the normal range (130-139/85-89 mm Hg). ? People who are overweight or obese. ? People who are African American.  If you are 25-70 years of age, have your blood pressure checked every 3-5 years. If you are 86 years of age or older, have your blood pressure checked every year. You should have your blood pressure measured twice-once when you are at a hospital or clinic, and once when you are not at a hospital or clinic. Record the average of the two measurements. To check your blood pressure when you are not at a hospital or clinic, you can use: ? An automated blood pressure machine at a pharmacy. ? A home blood pressure monitor.  If  you are between 34 years and 33 years old, ask your health care provider if you should take aspirin to prevent strokes.  Have regular diabetes screenings. This involves taking a blood sample to check your fasting blood sugar level. ? If you are at a normal weight and have a low risk for diabetes, have this test once every three years after 46 years of age. ? If you are overweight and have a high risk for diabetes, consider being tested at a younger age or more often. Preventing infection Hepatitis B  If you have a higher risk for hepatitis B, you should be screened for this virus. You are considered at high risk for hepatitis B if: ? You were born in a country where hepatitis B is common. Ask your health care provider which countries are considered high risk. ? Your parents were born in a high-risk country, and you have not been immunized against hepatitis B (hepatitis B vaccine). ? You have HIV or AIDS. ? You use needles to inject street drugs. ? You live with someone who has hepatitis B. ? You have had sex with someone who has hepatitis B. ? You get hemodialysis treatment. ? You take certain medicines for conditions, including cancer, organ transplantation, and autoimmune conditions.  Hepatitis C  Blood testing is recommended for: ? Everyone born from [birth year] through 1965. ? Anyone with known risk factors for hepatitis C.  Sexually transmitted infections (STIs)  You should be screened for sexually transmitted infections (STIs) including gonorrhea and chlamydia if: ? You are sexually active and are younger than 46 years of age. ? You are older than 46 years of age and your health care provider tells you that you are at risk for this type of infection. ? Your sexual activity has changed since you were last screened and you are at an increased risk for chlamydia or gonorrhea. Ask your health care provider if you are at risk.  If you do not have HIV, but are at risk, it may be recommended  that you take a prescription medicine daily to prevent HIV infection. This is called pre-exposure prophylaxis (PrEP). You are considered at risk if: ? You are sexually active and do not regularly use condoms or know the HIV status of your partner(s). ? You take drugs by injection. ? You are sexually active with a partner who has HIV.  Talk with your health care provider about whether you are at high risk of being infected with HIV. If you choose to begin PrEP, you  should first be tested for HIV. You should then be tested every 3 months for as long as you are taking PrEP. Pregnancy  If you are premenopausal and you may become pregnant, ask your health care provider about preconception counseling.  If you may become pregnant, take 400 to 800 micrograms (mcg) of folic acid every day.  If you want to prevent pregnancy, talk to your health care provider about birth control (contraception). Osteoporosis and menopause  Osteoporosis is a disease in which the bones lose minerals and strength with aging. This can result in serious bone fractures. Your risk for osteoporosis can be identified using a bone density scan.  If you are 65 years of age or older, or if you are at risk for osteoporosis and fractures, ask your health care provider if you should be screened.  Ask your health care provider whether you should take a calcium or vitamin D supplement to lower your risk for osteoporosis.  Menopause may have certain physical symptoms and risks.  Hormone replacement therapy may reduce some of these symptoms and risks. Talk to your health care provider about whether hormone replacement therapy is right for you. Follow these instructions at home:  Schedule regular health, dental, and eye exams.  Stay current with your immunizations.  Do not use any tobacco products including cigarettes, chewing tobacco, or electronic cigarettes.  If you are pregnant, do not drink alcohol.  If you are  breastfeeding, limit how much and how often you drink alcohol.  Limit alcohol intake to no more than 1 drink per day for nonpregnant women. One drink equals 12 ounces of beer, 5 ounces of wine, or 1 ounces of hard liquor.  Do not use street drugs.  Do not share needles.  Ask your health care provider for help if you need support or information about quitting drugs.  Tell your health care provider if you often feel depressed.  Tell your health care provider if you have ever been abused or do not feel safe at home. This information is not intended to replace advice given to you by your health care provider. Make sure you discuss any questions you have with your health care provider. Document Released: 06/20/2011 Document Revised: 05/12/2016 Document Reviewed: 09/08/2015 Elsevier Interactive Patient Education  2018 [employer]  

## 2017-08-10 LAB — PAPIG, HPV, RFX 16/18
HPV, high-risk: NEGATIVE
PAP Smear Comment: 0

## 2017-09-16 DIAGNOSIS — Z23 Encounter for immunization: Secondary | ICD-10-CM | POA: Diagnosis not present

## 2018-03-27 DIAGNOSIS — H5213 Myopia, bilateral: Secondary | ICD-10-CM | POA: Diagnosis not present

## 2018-03-28 DIAGNOSIS — H524 Presbyopia: Secondary | ICD-10-CM | POA: Diagnosis not present

## 2018-06-04 DIAGNOSIS — H52223 Regular astigmatism, bilateral: Secondary | ICD-10-CM | POA: Diagnosis not present

## 2018-07-10 ENCOUNTER — Encounter: Payer: Self-pay | Admitting: Family

## 2018-07-18 ENCOUNTER — Encounter: Payer: BLUE CROSS/BLUE SHIELD | Admitting: Family

## 2018-07-30 DIAGNOSIS — Z9884 Bariatric surgery status: Secondary | ICD-10-CM | POA: Diagnosis not present

## 2018-07-30 DIAGNOSIS — R100 Acute abdomen: Secondary | ICD-10-CM | POA: Diagnosis not present

## 2018-08-02 DIAGNOSIS — R109 Unspecified abdominal pain: Secondary | ICD-10-CM | POA: Diagnosis not present

## 2018-08-02 DIAGNOSIS — R100 Acute abdomen: Secondary | ICD-10-CM | POA: Diagnosis not present

## 2018-08-03 ENCOUNTER — Ambulatory Visit: Payer: BLUE CROSS/BLUE SHIELD | Admitting: Family

## 2018-08-23 DIAGNOSIS — Z9884 Bariatric surgery status: Secondary | ICD-10-CM | POA: Diagnosis not present

## 2018-08-23 DIAGNOSIS — Z713 Dietary counseling and surveillance: Secondary | ICD-10-CM | POA: Diagnosis not present

## 2018-08-23 DIAGNOSIS — Z48815 Encounter for surgical aftercare following surgery on the digestive system: Secondary | ICD-10-CM | POA: Diagnosis not present

## 2018-09-03 ENCOUNTER — Encounter: Payer: Self-pay | Admitting: Family

## 2018-09-03 ENCOUNTER — Ambulatory Visit (INDEPENDENT_AMBULATORY_CARE_PROVIDER_SITE_OTHER): Payer: BLUE CROSS/BLUE SHIELD | Admitting: Family

## 2018-09-03 VITALS — BP 122/84 | HR 76 | Temp 98.4°F | Resp 15 | Ht 65.0 in | Wt 270.1 lb

## 2018-09-03 DIAGNOSIS — M7989 Other specified soft tissue disorders: Secondary | ICD-10-CM

## 2018-09-03 DIAGNOSIS — Z0000 Encounter for general adult medical examination without abnormal findings: Secondary | ICD-10-CM | POA: Diagnosis not present

## 2018-09-03 DIAGNOSIS — Z23 Encounter for immunization: Secondary | ICD-10-CM | POA: Diagnosis not present

## 2018-09-03 LAB — COMPREHENSIVE METABOLIC PANEL
ALT: 11 U/L (ref 0–35)
AST: 16 U/L (ref 0–37)
Albumin: 3.8 g/dL (ref 3.5–5.2)
Alkaline Phosphatase: 94 U/L (ref 39–117)
BUN: 13 mg/dL (ref 6–23)
CO2: 31 mEq/L (ref 19–32)
Calcium: 8.9 mg/dL (ref 8.4–10.5)
Chloride: 105 mEq/L (ref 96–112)
Creatinine, Ser: 0.71 mg/dL (ref 0.40–1.20)
GFR: 93.63 mL/min (ref >60.00)
Glucose, Bld: 87 mg/dL (ref 70–99)
Potassium: 3.8 mEq/L (ref 3.5–5.1)
Sodium: 140 mEq/L (ref 135–145)
Total Bilirubin: 0.4 mg/dL (ref 0.2–1.2)
Total Protein: 6.5 g/dL (ref 6.0–8.3)

## 2018-09-03 LAB — TSH: TSH: 1.38 u[IU]/mL (ref 0.35–4.50)

## 2018-09-03 LAB — HEMOGLOBIN A1C: Hgb A1c MFr Bld: 5.1 % (ref 4.6–6.5)

## 2018-09-03 NOTE — Patient Instructions (Addendum)
We placed a referral for mammogram this year. I asked that you call one the below locations and schedule this when it is convenient for you.   As discussed, I would like you to ask for 3D mammogram over the traditional 2D mammogram as new evidence suggest 3D is superior.   Please note that NOT all insurance companies cover 3D and you may have to pay a higher copay. You may call your insurance company to further clarify your benefits.   Options for [place]  [place], [place]  * [facility] [place] * Note if you ask for 3D mammogram at this location, you must request [place], [place] location*   Today we discussed referrals, orders. Redgie Grayer Weight Loss physican   I have placed these orders in the system for you.  Please be sure to give [place] a call if you have not heard from our office regarding scheduling a test or regarding referral in a timely manner.  It is very important that you let me know as soon as possible.    Health Maintenance, Female Adopting a healthy lifestyle and getting preventive care can go a long way to promote health and wellness. Talk with your health care provider about what schedule of regular examinations is right for you. This is a good chance for you to check in with your provider about disease prevention and staying healthy. In between checkups, there are plenty of things you can do on your own. Experts have done a lot of research about which lifestyle changes and preventive measures are most likely to keep you healthy. Ask your health care provider for more information. Weight and diet Eat a healthy diet  Be sure to include plenty of vegetables, fruits, low-fat dairy products, and lean protein.  Do not eat a lot of foods high in solid fats, added sugars, or salt.  Get regular exercise. This is  one of the most important things you can do for your health. ? Most adults should exercise for at least 150 minutes each week. The exercise should increase your heart rate and make you sweat (moderate-intensity exercise). ? Most adults should also do strengthening exercises at least twice a week. This is in addition to the moderate-intensity exercise.  Maintain a healthy weight  Body mass index (BMI) is a measurement that can be used to identify possible weight problems. It estimates body fat based on height and weight. Your health care provider can help determine your BMI and help you achieve or maintain a healthy weight.  For females 1 years of age and older: ? A BMI below 18.5 is considered underweight. ? A BMI of 18.5 to 24.9 is normal. ? A BMI of 25 to 29.9 is considered overweight. ? A BMI of 30 and above is considered obese.  Watch levels of cholesterol and blood lipids  You should start having your blood tested for lipids and cholesterol at 47 years of age, then have this test every 5 years.  You may need to have your cholesterol levels checked more often if: ? Your lipid or cholesterol levels are high. ? You are older than 47 years of age. ? You are at high risk for heart disease.  Cancer screening Lung Cancer  Lung cancer screening is recommended for adults 25-55 years old who are at high risk for  lung cancer because of a history of smoking.  A yearly low-dose CT scan of the lungs is recommended for people who: ? Currently smoke. ? Have quit within the past 15 years. ? Have at least a 30-pack-year history of smoking. A pack year is smoking an average of one pack of cigarettes a day for 1 year.  Yearly screening should continue until it has been 15 years since you quit.  Yearly screening should stop if you develop a health problem that would prevent you from having lung cancer treatment.  Breast Cancer  Practice breast self-awareness. This means understanding how your  breasts normally appear and feel.  It also means doing regular breast self-exams. Let your health care provider know about any changes, no matter how small.  If you are in your 20s or 30s, you should have a clinical breast exam (CBE) by a health care provider every 1-3 years as part of a regular health exam.  If you are 62 or older, have a CBE every year. Also consider having a breast X-ray (mammogram) every year.  If you have a family history of breast cancer, talk to your health care provider about genetic screening.  If you are at high risk for breast cancer, talk to your health care provider about having an MRI and a mammogram every year.  Breast cancer gene (BRCA) assessment is recommended for women who have family members with BRCA-related cancers. BRCA-related cancers include: ? Breast. ? Ovarian. ? Tubal. ? Peritoneal cancers.  Results of the assessment will determine the need for genetic counseling and BRCA1 and BRCA2 testing.  Cervical Cancer Your health care provider may recommend that you be screened regularly for cancer of the pelvic organs (ovaries, uterus, and vagina). This screening involves a pelvic examination, including checking for microscopic changes to the surface of your cervix (Pap test). You may be encouraged to have this screening done every 3 years, beginning at age 72.  For women ages 8-65, health care providers may recommend pelvic exams and Pap testing every 3 years, or they may recommend the Pap and pelvic exam, combined with testing for human papilloma virus (HPV), every 5 years. Some types of HPV increase your risk of cervical cancer. Testing for HPV may also be done on women of any age with unclear Pap test results.  Other health care providers may not recommend any screening for nonpregnant women who are considered low risk for pelvic cancer and who do not have symptoms. Ask your health care provider if a screening pelvic exam is right for you.  If you  have had past treatment for cervical cancer or a condition that could lead to cancer, you need Pap tests and screening for cancer for at least 20 years after your treatment. If Pap tests have been discontinued, your risk factors (such as having a new sexual partner) need to be reassessed to determine if screening should resume. Some women have medical problems that increase the chance of getting cervical cancer. In these cases, your health care provider may recommend more frequent screening and Pap tests.  Colorectal Cancer  This type of cancer can be detected and often prevented.  Routine colorectal cancer screening usually begins at 47 years of age and continues through 47 years of age.  Your health care provider may recommend screening at an earlier age if you have risk factors for colon cancer.  Your health care provider may also recommend using home test kits to check for hidden blood in  the stool.  A small camera at the end of a tube can be used to examine your colon directly (sigmoidoscopy or colonoscopy). This is done to check for the earliest forms of colorectal cancer.  Routine screening usually begins at age 48.  Direct examination of the colon should be repeated every 5-10 years through 47 years of age. However, you may need to be screened more often if early forms of precancerous polyps or small growths are found.  Skin Cancer  Check your skin from head to toe regularly.  Tell your health care provider about any new moles or changes in moles, especially if there is a change in a mole's shape or color.  Also tell your health care provider if you have a mole that is larger than the size of a pencil eraser.  Always use sunscreen. Apply sunscreen liberally and repeatedly throughout the day.  Protect yourself by wearing long sleeves, pants, a wide-brimmed hat, and sunglasses whenever you are outside.  Heart disease, diabetes, and high blood pressure  High blood pressure causes  heart disease and increases the risk of stroke. High blood pressure is more likely to develop in: ? People who have blood pressure in the high end of the normal range (130-139/85-89 mm Hg). ? People who are overweight or obese. ? People who are African American.  If you are 64-44 years of age, have your blood pressure checked every 3-5 years. If you are 61 years of age or older, have your blood pressure checked every year. You should have your blood pressure measured twice-once when you are at a hospital or clinic, and once when you are not at a hospital or clinic. Record the average of the two measurements. To check your blood pressure when you are not at a hospital or clinic, you can use: ? An automated blood pressure machine at a pharmacy. ? A home blood pressure monitor.  If you are between 59 years and 24 years old, ask your health care provider if you should take aspirin to prevent strokes.  Have regular diabetes screenings. This involves taking a blood sample to check your fasting blood sugar level. ? If you are at a normal weight and have a low risk for diabetes, have this test once every three years after 47 years of age. ? If you are overweight and have a high risk for diabetes, consider being tested at a younger age or more often. Preventing infection Hepatitis B  If you have a higher risk for hepatitis B, you should be screened for this virus. You are considered at high risk for hepatitis B if: ? You were born in a country where hepatitis B is common. Ask your health care provider which countries are considered high risk. ? Your parents were born in a high-risk country, and you have not been immunized against hepatitis B (hepatitis B vaccine). ? You have HIV or AIDS. ? You use needles to inject street drugs. ? You live with someone who has hepatitis B. ? You have had sex with someone who has hepatitis B. ? You get hemodialysis treatment. ? You take certain medicines for  conditions, including cancer, organ transplantation, and autoimmune conditions.  Hepatitis C  Blood testing is recommended for: ? Everyone born from [birth year] through 1965. ? Anyone with known risk factors for hepatitis C.  Sexually transmitted infections (STIs)  You should be screened for sexually transmitted infections (STIs) including gonorrhea and chlamydia if: ? You are sexually active and are  younger than 47 years of age. ? You are older than 47 years of age and your health care provider tells you that you are at risk for this type of infection. ? Your sexual activity has changed since you were last screened and you are at an increased risk for chlamydia or gonorrhea. Ask your health care provider if you are at risk.  If you do not have HIV, but are at risk, it may be recommended that you take a prescription medicine daily to prevent HIV infection. This is called pre-exposure prophylaxis (PrEP). You are considered at risk if: ? You are sexually active and do not regularly use condoms or know the HIV status of your partner(s). ? You take drugs by injection. ? You are sexually active with a partner who has HIV.  Talk with your health care provider about whether you are at high risk of being infected with HIV. If you choose to begin PrEP, you should first be tested for HIV. You should then be tested every 3 months for as long as you are taking PrEP. Pregnancy  If you are premenopausal and you may become pregnant, ask your health care provider about preconception counseling.  If you may become pregnant, take 400 to 800 micrograms (mcg) of folic acid every day.  If you want to prevent pregnancy, talk to your health care provider about birth control (contraception). Osteoporosis and menopause  Osteoporosis is a disease in which the bones lose minerals and strength with aging. This can result in serious bone fractures. Your risk for osteoporosis can be identified using a bone density  scan.  If you are 32 years of age or older, or if you are at risk for osteoporosis and fractures, ask your health care provider if you should be screened.  Ask your health care provider whether you should take a calcium or vitamin D supplement to lower your risk for osteoporosis.  Menopause may have certain physical symptoms and risks.  Hormone replacement therapy may reduce some of these symptoms and risks. Talk to your health care provider about whether hormone replacement therapy is right for you. Follow these instructions at home:  Schedule regular health, dental, and eye exams.  Stay current with your immunizations.  Do not use any tobacco products including cigarettes, chewing tobacco, or electronic cigarettes.  If you are pregnant, do not drink alcohol.  If you are breastfeeding, limit how much and how often you drink alcohol.  Limit alcohol intake to no more than 1 drink per day for nonpregnant women. One drink equals 12 ounces of beer, 5 ounces of wine, or 1 ounces of hard liquor.  Do not use street drugs.  Do not share needles.  Ask your health care provider for help if you need support or information about quitting drugs.  Tell your health care provider if you often feel depressed.  Tell your health care provider if you have ever been abused or do not feel safe at home. This information is not intended to replace advice given to you by your health care provider. Make sure you discuss any questions you have with your health care provider. Document Released: 06/20/2011 Document Revised: 05/12/2016 Document Reviewed: 09/08/2015 Elsevier Interactive Patient Education  [employer].

## 2018-09-03 NOTE — Assessment & Plan Note (Addendum)
Mammogram ordered the patient no longer follows with surgery, Dr. Thurman Coyer.  She understands schedule mammogram.  Pap smear is up-to-date, in the absence of pelvic complaints, deferred pelvic exam at this time as patient will continue to follow with GYN.  Clinical breast exam performed today.  Discussed weight gain with patient, jointly agreed to consult with medical weight loss, Redgie Grayer is appropriate next step.  Referral has been placed. Note: We discussed relevant and past history of abnormal labs today at length, we decided to order appropriate labs for screening today based on this discussion. Patient was comfortable with this. Patient was advised if any symptoms were to change after today's visit, to notify me as we may always order additional labs.

## 2018-09-03 NOTE — Assessment & Plan Note (Signed)
Chronic, bilateral.  Resolved HCTZ.  Will continue

## 2018-09-03 NOTE — Progress Notes (Signed)
Subjective:    Patient ID: [name], female    DOB: December 09, 1971, 47 y.o.   MRN: [id number]  CC: [name] is a 47 y.o. female who presents today for physical exam.    HPI:  Overall feels well today.  No fatigue. She remains frustrated by her weight gain.  History of gastric bypass.  Eating small frequent meals however not eating healthy all the time.  No regular exercise at this time.  LE swelling- takes HCTZ  For this, most days. Didn't take over the weekend. Denies exertional chest pain or pressure, numbness or tingling radiating to left arm or jaw, orthopnea, palpitations, dizziness, frequent headaches, changes in vision, or shortness of breath.     Colorectal Cancer Screening: no early family history Breast Cancer Screening: Mammogram due; had followed with [facility] in the past due breast biopsies.Per patient, no longer needs to follow with surgeon.  Cervical Cancer Screening: UTD 2018, normal per patient;  Unable to see. follows with [facility]. Vaginal bleeding has resolved.  Bone Health screening/DEXA for 65+: No increased fracture risk. Defer screening at this time. Lung Cancer Screening: Doesn't have 30 year pack year history and age > 30 years.  Immunizations       Tetanus - utd        She receives flu vaccine at work  Labs: Screening labs today. Exercise: No regular exercise.  Alcohol use: occasional Smoking/tobacco use: Nonsmoker.   HISTORY:  Past Medical History:  Diagnosis Date  . Blood in urine 2014  . Breast mass 2013  . BV (bacterial vaginosis)   . Edema    legs  . Hypertension   . Thyroid disease     Past Surgical History:  Procedure Laterality Date  . BREAST BIOPSY Right 2013   benign - fatty tissue  . BREAST BIOPSY Left 06-12-13   fibroadenoma /core biopsy  . BREAST BIOPSY Right 06-17-13   fibrocystic /stereo biopsy  . CESAREAN SECTION  2007  . DILATION AND CURETTAGE OF UTERUS    . FOOT SURGERY Left    01/2017  . ROUX-EN-Y  PROCEDURE  03/04/2014   [name]  . TUBAL LIGATION     Family History  Problem Relation Age of Onset  . Arthritis Mother   . Diabetes Father   . Hypertension Father   . Polycystic ovary syndrome Sister   . Cancer Maternal Grandmother 16       breast  . Breast cancer Neg Hx   . Ovarian cancer Neg Hx       ALLERGIES: Tape and Avelox [moxifloxacin hcl in nacl]  Current Outpatient Medications on File Prior to Visit  Medication Sig Dispense Refill  . hydrochlorothiazide (HYDRODIURIL) 25 MG tablet Take 1 tablet (25 mg total) by mouth daily. 90 tablet 3   No current facility-administered medications on file prior to visit.     Social History   Tobacco Use  . Smoking status: Former Research scientist (life sciences)  . Smokeless tobacco: Never Used  . Tobacco comment: quit 2006  Substance Use Topics  . Alcohol use: Yes    Comment: rare  . Drug use: No    Review of Systems  Constitutional: Negative for chills, fatigue, fever and unexpected weight change.  HENT: Negative for congestion.   Respiratory: Negative for cough.   Cardiovascular: Positive for leg swelling. Negative for chest pain and palpitations.  Gastrointestinal: Negative for nausea and vomiting.  Genitourinary: Negative for hematuria and vaginal bleeding.  Musculoskeletal: Negative for arthralgias  and myalgias.  Skin: Negative for rash.  Neurological: Negative for headaches.  Hematological: Negative for adenopathy.  Psychiatric/Behavioral: Negative for confusion.      Objective:    BP 122/84 (BP Location: Left Arm, Patient Position: Sitting, Cuff Size: Large)   Pulse 76   Temp 98.4 F (36.9 C) (Oral)   Resp 15   Ht 5\' 5"  (1.651 m)   Wt 270 lb 2 oz (122.5 kg)   SpO2 98%   BMI 44.95 kg/m   BP Readings from Last 3 Encounters:  09/03/18 122/84  08/08/17 108/67  06/27/17 130/74   Wt Readings from Last 3 Encounters:  09/03/18 270 lb 2 oz (122.5 kg)  08/08/17 256 lb 9.6 oz (116.4 kg)  06/27/17 251 lb (113.9 kg)    Physical  Exam  Constitutional: She appears well-developed and well-nourished.  Eyes: Conjunctivae are normal.  Neck: No thyroid mass and no thyromegaly present.  Cardiovascular: Normal rate, regular rhythm, normal heart sounds and normal pulses.  Trace non pitting BLE edema. No palpable cords or masses. No erythema or increased warmth. No asymmetry in calf size when compared bilaterally LE warm and palpable pedal pulses.   Pulmonary/Chest: Effort normal and breath sounds normal. She has no wheezes. She has no rhonchi. She has no rales. Right breast exhibits no inverted nipple, no mass, no nipple discharge, no skin change and no tenderness. Left breast exhibits no inverted nipple, no mass, no nipple discharge, no skin change and no tenderness. Breasts are symmetrical.  CBE performed.   Lymphadenopathy:       Head (right side): No submental, no submandibular, no tonsillar, no preauricular, no posterior auricular and no occipital adenopathy present.       Head (left side): No submental, no submandibular, no tonsillar, no preauricular, no posterior auricular and no occipital adenopathy present.    She has no cervical adenopathy.       Right cervical: No superficial cervical, no deep cervical and no posterior cervical adenopathy present.      Left cervical: No superficial cervical, no deep cervical and no posterior cervical adenopathy present.    She has no axillary adenopathy.  Neurological: She is alert.  Skin: Skin is warm and dry.  Psychiatric: She has a normal mood and affect. Her speech is normal and behavior is normal. Thought content normal.  Vitals reviewed.      Assessment & Plan:   Problem List Items Addressed This Visit      Other   Routine general medical examination at a health care facility - Primary    Mammogram ordered the patient no longer follows with surgery, [name].  She understands schedule mammogram.  Pap smear is up-to-date, in the absence of pelvic complaints, deferred  pelvic exam at this time as patient will continue to follow with GYN.  Clinical breast exam performed today.  Discussed weight gain with patient, jointly agreed to consult with medical weight loss, [name] is appropriate next step.  Referral has been placed. Note: We discussed relevant and past history of abnormal labs today at length, we decided to order appropriate labs for screening today based on this discussion. Patient was comfortable with this. Patient was advised if any symptoms were to change after today's visit, to notify me as we may always order additional labs.       Relevant Orders   MM 3D SCREEN BREAST BILATERAL   Amb Ref to Medical Weight Management   Comprehensive metabolic panel   Hemoglobin A1c  TSH   Leg swelling    Chronic, bilateral.  Resolved HCTZ.  Will continue       Other Visit Diagnoses    Need for immunization against influenza       Relevant Orders   Flu Vaccine QUAD 36+ mos IM (Completed)       I am having Levada Dy D. Roehm maintain her hydrochlorothiazide.   No orders of the defined types were placed in this encounter.   Return precautions given.   Risks, benefits, and alternatives of the medications and treatment plan prescribed today were discussed, and patient expressed understanding.   Education regarding symptom management and diagnosis given to patient on AVS.   Continue to follow with Burnard Hawthorne, FNP for routine health maintenance.   Ladoris Gene and I agreed with plan.   Mable Paris, FNP

## 2018-09-06 ENCOUNTER — Other Ambulatory Visit: Payer: Self-pay | Admitting: Family

## 2018-09-11 ENCOUNTER — Encounter: Payer: BLUE CROSS/BLUE SHIELD | Admitting: Obstetrics and Gynecology

## 2018-09-11 DIAGNOSIS — Z1231 Encounter for screening mammogram for malignant neoplasm of breast: Secondary | ICD-10-CM | POA: Diagnosis not present

## 2018-09-21 ENCOUNTER — Encounter: Payer: Self-pay | Admitting: Family

## 2018-10-04 DIAGNOSIS — L538 Other specified erythematous conditions: Secondary | ICD-10-CM | POA: Diagnosis not present

## 2018-10-04 DIAGNOSIS — L720 Epidermal cyst: Secondary | ICD-10-CM | POA: Diagnosis not present

## 2018-10-04 DIAGNOSIS — D2271 Melanocytic nevi of right lower limb, including hip: Secondary | ICD-10-CM | POA: Diagnosis not present

## 2018-10-04 DIAGNOSIS — D2262 Melanocytic nevi of left upper limb, including shoulder: Secondary | ICD-10-CM | POA: Diagnosis not present

## 2018-10-04 DIAGNOSIS — D225 Melanocytic nevi of trunk: Secondary | ICD-10-CM | POA: Diagnosis not present

## 2018-10-04 DIAGNOSIS — D2261 Melanocytic nevi of right upper limb, including shoulder: Secondary | ICD-10-CM | POA: Diagnosis not present

## 2018-10-23 ENCOUNTER — Encounter: Payer: Self-pay | Admitting: Family

## 2018-12-17 DIAGNOSIS — Z79899 Other long term (current) drug therapy: Secondary | ICD-10-CM | POA: Diagnosis not present

## 2018-12-17 DIAGNOSIS — K912 Postsurgical malabsorption, not elsewhere classified: Secondary | ICD-10-CM | POA: Diagnosis not present

## 2018-12-17 DIAGNOSIS — Z9884 Bariatric surgery status: Secondary | ICD-10-CM | POA: Diagnosis not present

## 2019-06-06 DIAGNOSIS — Z03818 Encounter for observation for suspected exposure to other biological agents ruled out: Secondary | ICD-10-CM | POA: Diagnosis not present

## 2019-08-27 ENCOUNTER — Ambulatory Visit (INDEPENDENT_AMBULATORY_CARE_PROVIDER_SITE_OTHER): Payer: BC Managed Care – PPO | Admitting: Family Medicine

## 2019-08-27 ENCOUNTER — Other Ambulatory Visit: Payer: Self-pay

## 2019-08-27 ENCOUNTER — Encounter: Payer: Self-pay | Admitting: Family Medicine

## 2019-08-27 VITALS — Ht 65.0 in | Wt 270.0 lb

## 2019-08-27 DIAGNOSIS — R51 Headache: Secondary | ICD-10-CM

## 2019-08-27 DIAGNOSIS — R05 Cough: Secondary | ICD-10-CM

## 2019-08-27 DIAGNOSIS — R509 Fever, unspecified: Secondary | ICD-10-CM | POA: Diagnosis not present

## 2019-08-27 DIAGNOSIS — Z20822 Contact with and (suspected) exposure to covid-19: Secondary | ICD-10-CM

## 2019-08-27 DIAGNOSIS — R6889 Other general symptoms and signs: Secondary | ICD-10-CM | POA: Diagnosis not present

## 2019-08-27 NOTE — Progress Notes (Signed)
Virtual Visit via video Note  This visit type was conducted due to national recommendations for restrictions regarding the COVID-19 pandemic (e.g. social distancing).  This format is felt to be most appropriate for this patient at this time.  All issues noted in this document were discussed and addressed.  No physical exam was performed (except for noted visual exam findings with Video Visits).   I connected with [name] today at  3:15 PM EDT by a video enabled telemedicine application and verified that I am speaking with the correct person using two identifiers. Location patient: home Location provider: work  Persons participating in the virtual visit: patient, provider  I discussed the limitations, risks, security and privacy concerns of performing an evaluation and management service by telephone and the availability of in person appointments. I also discussed with the patient that there may be a patient responsible charge related to this service. The patient expressed understanding and agreed to proceed.   Reason for visit: same day visit  HPI: Cough: Patient reports onset of symptoms 3 to 4 days ago.  She went to the beach and then started to feel poorly on Saturday and then came home on Sunday and felt feverish and achy.  T-max 99.7.  She has had some headache, fever, and cough.  Notes head and chest congestion.  Mild shortness of breath with stairs.  Notes her throat is sore and her glands feel swollen.  She notes no COVID-19 exposure.  She was tested earlier this morning.  She notes her brother was sick as well though he has improved.   ROS: See pertinent positives and negatives per HPI.  Past Medical History:  Diagnosis Date  . Blood in urine 2014  . Breast mass 2013  . BV (bacterial vaginosis)   . Edema    legs  . Hypertension   . Thyroid disease     Past Surgical History:  Procedure Laterality Date  . BREAST BIOPSY Right 2013   benign - fatty tissue  . BREAST  BIOPSY Left 06-12-13   fibroadenoma /core biopsy  . BREAST BIOPSY Right 06-17-13   fibrocystic /stereo biopsy  . CESAREAN SECTION  2007  . DILATION AND CURETTAGE OF UTERUS    . FOOT SURGERY Left    01/2017  . ROUX-EN-Y PROCEDURE  03/04/2014   Dr. [place]  . TUBAL LIGATION      Family History  Problem Relation Age of Onset  . Arthritis Mother   . Diabetes Father   . Hypertension Father   . Polycystic ovary syndrome Sister   . Cancer Maternal Grandmother 62       breast  . Breast cancer Neg Hx   . Ovarian cancer Neg Hx     SOCIAL HX: Former smoker   Current Outpatient Medications:  .  hydrochlorothiazide (HYDRODIURIL) 25 MG tablet, TAKE ONE TABLET BY MOUTH EVERY DAY, Disp: 90 tablet, Rfl: 3  EXAM:  VITALS per patient if applicable: None.  GENERAL: alert, oriented, appears well and in no acute distress  HEENT: atraumatic, conjunttiva clear, no obvious abnormalities on inspection of external nose and ears  NECK: normal movements of the head and neck  LUNGS: on inspection no signs of respiratory distress, breathing rate appears normal, no obvious gross SOB, gasping or wheezing  CV: no obvious cyanosis  MS: moves all visible extremities without noticeable abnormality   ASSESSMENT AND PLAN:  Discussed the following assessment and plan:  Suspected Covid-19 Virus Infection Symptoms are concerning for COVID-19.  We will await her test result.  I discussed strict quarantine precautions and advised her of the non-test requirements to and quarantine.  Discussed that her family should quarantine as well if they are able to.  If they are not able to quarantine and they will need to wear a mask and check temperatures if they have to leave the house.  Advised that if her family develop symptoms they should be evaluated.  Discussed that her family could be tested 7 days after onset of her symptoms and their exposure.  Discussed supportive care with Tylenol or ibuprofen as well as  Mucinex.  Advised that if she needed to get those things she should have somebody from outside of her household pick them up from the pharmacy and leave them at her doorstep without the door being open.  Discussed reasons to seek medical attention in the emergency room.  Health department forms were sent through my chart.  She noted she would be okay doing the my chart monitoring and this was ordered.    I discussed the assessment and treatment plan with the patient. The patient was provided an opportunity to ask questions and all were answered. The patient agreed with the plan and demonstrated an understanding of the instructions.   The patient was advised to call back or seek an in-person evaluation if the symptoms worsen or if the condition fails to improve as anticipated.   Tommi Rumps, MD

## 2019-08-28 ENCOUNTER — Telehealth: Payer: Self-pay | Admitting: Family

## 2019-08-28 ENCOUNTER — Ambulatory Visit: Payer: Self-pay

## 2019-08-28 LAB — NOVEL CORONAVIRUS, NAA: SARS-CoV-2, NAA: DETECTED — AB

## 2019-08-28 NOTE — Telephone Encounter (Signed)
Incoming call from Patient reporting that she just received a call that she was Covid -22.  Patient inquiring.  Is there a medica that she could take? Inquired what sx is she having.  Patient reports that she has a sore throat and a earache.   Reviewed protocol land recommended Pt. Take throat lozengers May take tylenol  or Ibuprofen for earache pain. Patient voices understanding.      Reason for Disposition . [1] [id number] diagnosed by positive lab test AND [2] mild symptoms (e.g., cough, fever, others) AND [id number] no complications or SOB  Answer Assessment - Initial Assessment Questions 1. COVID-19 DIAGNOSIS: "Who made your Coronavirus (COVID-19) diagnosis?" "Was it confirmed by a positive lab test?" If not diagnosed by a HCP, ask "Are there lots of cases (community spread) where you live?" (See public health department website, if unsure)     Positive test  2. ONSET: "When did the COVID-19 symptoms start?"      yesterday 3. WORST SYMPTOM: "What is your worst symptom?" (e.g., cough, fever, shortness of breath, muscle aches)     Ear ache and sore throat 4. COUGH: "Do you have a cough?" If so, ask: "How bad is the cough?"        denies 5. FEVER: "Do you have a fever?" If so, ask: "What is your temperature, how was it measured, and when did it start?"      Denies 6. RESPIRATORY STATUS: "Describe your breathing?" (e.g., shortness of breath, wheezing, unable to speak)    denies 7. BETTER-SAME-WORSE: "Are you getting better, staying the same or getting worse compared to yesterday?"  If getting worse, ask, "In what way?"     same 8. HIGH RISK DISEASE: "Do you have any chronic medical problems?" (e.g., asthma, heart or lung disease, weak immune system, etc.)   denies 9. PREGNANCY: "Is there any chance you are pregnant?" "When was your last menstrual period?"     denies 10. OTHER SYMPTOMS: "Do you have any other symptoms?"  (e.g., chills, fatigue, headache, loss of smell or taste, muscle pain, sore  throat)       Sore throat , earache.  Protocols used: CORONAVIRUS (COVID-19) DIAGNOSED OR SUSPECTED-A-AH

## 2019-08-28 NOTE — Telephone Encounter (Signed)
close

## 2019-08-29 DIAGNOSIS — Z20822 Contact with and (suspected) exposure to covid-19: Secondary | ICD-10-CM | POA: Insufficient documentation

## 2019-08-29 NOTE — Assessment & Plan Note (Signed)
Symptoms are concerning for COVID-19.  We will await her test result.  I discussed strict quarantine precautions and advised her of the non-test requirements to and quarantine.  Discussed that her family should quarantine as well if they are able to.  If they are not able to quarantine and they will need to wear a mask and check temperatures if they have to leave the house.  Advised that if her family develop symptoms they should be evaluated.  Discussed that her family could be tested 7 days after onset of her symptoms and their exposure.  Discussed supportive care with Tylenol or ibuprofen as well as Mucinex.  Advised that if she needed to get those things she should have somebody from outside of her household pick them up from the pharmacy and leave them at her doorstep without the door being open.  Discussed reasons to seek medical attention in the emergency room.  Health department forms were sent through my chart.  She noted she would be okay doing the my chart monitoring and this was ordered.

## 2019-08-29 NOTE — Telephone Encounter (Addendum)
Patient said that she is feeling better but patient said that she is still fatigued, sore throat, left ear pain when swallowing.  Patient was scheduled virtual visit w/ PCP on tomorrow (08/30/19) @ 9:00 am.  Patient advised to go to UC if symptoms worsen.

## 2019-08-30 ENCOUNTER — Encounter: Payer: Self-pay | Admitting: Family

## 2019-08-30 ENCOUNTER — Ambulatory Visit (INDEPENDENT_AMBULATORY_CARE_PROVIDER_SITE_OTHER): Payer: BC Managed Care – PPO | Admitting: Family

## 2019-08-30 VITALS — Ht 65.0 in | Wt 270.0 lb

## 2019-08-30 DIAGNOSIS — H66002 Acute suppurative otitis media without spontaneous rupture of ear drum, left ear: Secondary | ICD-10-CM | POA: Diagnosis not present

## 2019-08-30 DIAGNOSIS — H6690 Otitis media, unspecified, unspecified ear: Secondary | ICD-10-CM | POA: Insufficient documentation

## 2019-08-30 MED ORDER — LIDOCAINE VISCOUS HCL 2 % MT SOLN: 10.0000 mL | OROMUCOSAL | 0 refills | Status: DC | PRN

## 2019-08-30 MED ORDER — AMOXICILLIN-POT CLAVULANATE 875-125 MG PO TABS: 1.0000 | ORAL_TABLET | Freq: Two times a day (BID) | ORAL | 0 refills | Status: DC

## 2019-08-30 NOTE — Progress Notes (Signed)
This visit type was conducted due to national recommendations for restrictions regarding the COVID-19 pandemic (e.g. social distancing).  This format is felt to be most appropriate for this patient at this time.  All issues noted in this document were discussed and addressed.  No physical exam was performed (except for noted visual exam findings with Video Visits). Virtual Visit via Video Note  I connected with@  on 08/30/19 at  9:00 AM EDT by a video enabled telemedicine application and verified that I am speaking with the correct person using two identifiers.  Location patient: home Location provider:work  Persons participating in the virtual visit: patient, provider  I discussed the limitations of evaluation and management by telemedicine and the availability of in person appointments. The patient expressed understanding and agreed to proceed.   HPI:  Symptoms started 7 days ago. Fever has been persistent even on tylenol, ibuprofen , temperature 99.66F. Today 101.   Continues to have left ear and left sided throat pain, changes to taste, congestion. Feels 'little winded'.. Drinking plenty of water, no pain with liquids; hurts to swallow foods.   NO HA, vision changes, cough, wheezing,  cp.  H/o tonsillectomy  On mucinex, sudafed., flonase. Health department is aware of covid.    ROS: See pertinent positives and negatives per HPI.  Past Medical History:  Diagnosis Date  . Blood in urine 2014  . Breast mass 2013  . BV (bacterial vaginosis)   . Edema    legs  . Hypertension   . Thyroid disease     Past Surgical History:  Procedure Laterality Date  . BREAST BIOPSY Right 2013   benign - fatty tissue  . BREAST BIOPSY Left 06-12-13   fibroadenoma /core biopsy  . BREAST BIOPSY Right 06-17-13   fibrocystic /stereo biopsy  . CESAREAN SECTION  2007  . DILATION AND CURETTAGE OF UTERUS    . FOOT SURGERY Left    01/2017  . ROUX-EN-Y PROCEDURE  03/04/2014   Dr. [place]  . TUBAL  LIGATION      Family History  Problem Relation Age of Onset  . Arthritis Mother   . Diabetes Father   . Hypertension Father   . Polycystic ovary syndrome Sister   . Cancer Maternal Grandmother 12       breast  . Breast cancer Neg Hx   . Ovarian cancer Neg Hx     SOCIAL HX: former smoker   Current Outpatient Medications:  .  hydrochlorothiazide (HYDRODIURIL) 25 MG tablet, TAKE ONE TABLET BY MOUTH EVERY DAY, Disp: 90 tablet, Rfl: 3 .  amoxicillin-clavulanate (AUGMENTIN) 875-125 MG tablet, Take 1 tablet by mouth 2 (two) times daily., Disp: 14 tablet, Rfl: 0 .  lidocaine (XYLOCAINE) 2 % solution, Use as directed 10 mLs in the mouth or throat every 3 (three) hours as needed for mouth pain (gargle; may spit or swallow)., Disp: 100 mL, Rfl: 0  EXAM:  BP Readings from Last 3 Encounters:  09/03/18 122/84  08/08/17 108/67  06/27/17 130/74    VITALS per patient if applicable:  GENERAL: alert, oriented, appears well and in no acute distress  HEENT: atraumatic, conjunttiva clear, no obvious abnormalities on inspection of external nose and ears.   NECK: normal movements of the head and neck  LUNGS: on inspection no signs of respiratory distress, breathing rate appears normal, no obvious gross SOB, gasping or wheezing  CV: no obvious cyanosis  MS: moves all visible extremities without noticeable abnormality  PSYCH/NEURO: pleasant and cooperative, no  obvious depression or anxiety, speech and thought processing grossly intact  ASSESSMENT AND PLAN:  Discussed the following assessment and plan:  Acute otitis media, unspecified otitis media type - Plan: amoxicillin-clavulanate (AUGMENTIN) 875-125 MG tablet, lidocaine (XYLOCAINE) 2 % solution  Non-recurrent acute suppurative otitis media of left ear without spontaneous rupture of tympanic membrane  -we discussed possible serious and likely etiologies, options for evaluation and workup, limitations of telemedicine visit vs in person  visit, treatment, treatment risks and precautions. Pt prefers to treat via telemedicine empirically rather then risking or undertaking an in person visit at this moment. Patient agrees to seek prompt in person care if worsening, new symptoms arise, or if is not improving with treatment.   I discussed the assessment and treatment plan with the patient. The patient was provided an opportunity to ask questions and all were answered. The patient agreed with the plan and demonstrated an understanding of the instructions.   The patient was advised to call back or seek an in-person evaluation if the symptoms worsen or if the condition fails to improve as anticipated.   Mable Paris, FNP

## 2019-08-30 NOTE — Patient Instructions (Signed)
Start Augmentin for concern for left-sided ear infection.  Ensure to take probiotics while on antibiotics and also for 2 weeks after completion. It is important to re-colonize the gut with good bacteria and also to prevent any diarrheal infections associated with antibiotic use.    Certainly if symptoms persist or worsen in any way, please go to urgent care over the weekend so you may have chest x-ray and evaluation done.  Stay quarantined as you are doing.

## 2019-08-30 NOTE — Assessment & Plan Note (Signed)
Patient is well-appearing, nontoxic in appearance.  She continues to have low-grade fever.  Discussed my suspicion for secondary bacterial infection causing left-sided otitis media.  We will go ahead and cover with Augmentin.  She was declines a chest x-ray today as her ear is primarily her concern which I feel is reasonable however advised patient if symptoms were to persist and certainly if she was to develop more upper respiratory symptoms, she would need to pursue a chest x-ray at urgent care over the weekend.  Patient verbalized understanding of all

## 2019-09-02 ENCOUNTER — Ambulatory Visit: Payer: Self-pay | Admitting: *Deleted

## 2019-09-02 NOTE — Telephone Encounter (Signed)
   Reason for Disposition . MILD difficulty breathing (e.g., minimal/no SOB at rest, SOB with walking, pulse <100)  Answer Assessment - Initial Assessment Questions 1. COVID-19 DIAGNOSIS: "Who made your Coronavirus (COVID-19) diagnosis?" "Was it confirmed by a positive lab test?" If not diagnosed by a HCP, ask "Are there lots of cases (community spread) where you live?" (See public health department website, if unsure)     Tested positive 08/27/2019 2. ONSET: "When did the COVID-19 symptoms start?"      Started 08/25/2019 3. WORST SYMPTOM: "What is your worst symptom?" (e.g., cough, fever, shortness of breath, muscle aches)     Fever and muscle aches  4. COUGH: "Do you have a cough?" If so, ask: "How bad is the cough?"       Cough with deep breathing 5. FEVER: "Do you have a fever?" If so, ask: "What is your temperature, how was it measured, and when did it start?"     Intermittent since 08/25/2019, only stopped by taking tylenol or ibuprofen  Temperature this morning was 100.7 6. RESPIRATORY STATUS: "Describe your breathing?" (e.g., shortness of breath, wheezing, unable to speak)      Shortness of breath more with activity  7. BETTER-SAME-WORSE: "Are you getting better, staying the same or getting worse compared to yesterday?"  If getting worse, ask, "In what way?"     Worse- shortness of breath, fever continued, muscle aches 8. HIGH RISK DISEASE: "Do you have any chronic medical problems?" (e.g., asthma, heart or lung disease, weak immune system, etc.)    Denies all  9. PREGNANCY: "Is there any chance you are pregnant?" "When was your last menstrual period?"     N/A 10. OTHER SYMPTOMS: "Do you have any other symptoms?"  (e.g., chills, fatigue, headache, loss of smell or taste, muscle pain, sore throat)       Chills and shaking  Protocols used: CORONAVIRUS (COVID-19) DIAGNOSED OR SUSPECTED-A-AH  Patient tested positive for COVID 19 on 08/27/2019.  She states she has been running a fever  intermittently since Sunday 08/25/2019 that is only broken with tylenol/ibuprofen.  She states her muscle aches, and shortness of breath feel worse than early last week.  She did a breathing treatment with her son's nebulizer and albuterol last night and this helped her shortness of breath.  Today she states her temperature was 100.7 when she woke up, and after taking medication fever subsided within about 2 hours.  After fever broke she states she started shaking uncontrollably.  This symptom has improved now, and her shortness of breath is only with activity.  Patient had virtual visit with Mable Paris, FNP on 08/30/2019 for ear pain, and was was prescribed Augmentin.  Patient states her ear pain has improved, but she feels like the rest of her symptoms are worse.  Patient is staying hydrated, states she drinks about 64 oz of water per day, she feels very tired, and has been sleeping a lot.  Called patient's PCP office, due to patient's symptoms, she would only be able to have a virtual visit.  Advised patient that I think she needs an in person evaluation as her symptoms are worsening after more than one week of having COVID 19.  Recommended that she be seen at Urgent Care for an in person evaluation today.  Advised her to have someone drive her to [facility] in [facility] as soon as possible.  Patient expressed understanding and is agreeable with plan.

## 2019-09-04 ENCOUNTER — Emergency Department
Admission: EM | Admit: 2019-09-04 | Discharge: 2019-09-04 | Disposition: A | Discharge status: Home / Self Care | Payer: BC Managed Care – PPO | Attending: Emergency Medicine | Admitting: Emergency Medicine

## 2019-09-04 ENCOUNTER — Telehealth: Payer: Self-pay | Admitting: Internal Medicine

## 2019-09-04 ENCOUNTER — Ambulatory Visit (INDEPENDENT_AMBULATORY_CARE_PROVIDER_SITE_OTHER): Payer: BC Managed Care – PPO | Admitting: Family

## 2019-09-04 ENCOUNTER — Ambulatory Visit
Admission: RE | Admit: 2019-09-04 | Discharge: 2019-09-04 | Disposition: A | Discharge status: Home / Self Care | Payer: BC Managed Care – PPO | Source: Ambulatory Visit | Attending: Family | Admitting: Family

## 2019-09-04 ENCOUNTER — Other Ambulatory Visit: Payer: Self-pay

## 2019-09-04 ENCOUNTER — Encounter: Payer: Self-pay | Admitting: Family

## 2019-09-04 ENCOUNTER — Emergency Department: Payer: BC Managed Care – PPO

## 2019-09-04 VITALS — Ht 65.0 in | Wt 270.0 lb

## 2019-09-04 DIAGNOSIS — U071 COVID-19: Secondary | ICD-10-CM | POA: Insufficient documentation

## 2019-09-04 DIAGNOSIS — I10 Essential (primary) hypertension: Secondary | ICD-10-CM | POA: Insufficient documentation

## 2019-09-04 DIAGNOSIS — R0602 Shortness of breath: Secondary | ICD-10-CM

## 2019-09-04 DIAGNOSIS — R079 Chest pain, unspecified: Secondary | ICD-10-CM

## 2019-09-04 DIAGNOSIS — Z79899 Other long term (current) drug therapy: Secondary | ICD-10-CM | POA: Diagnosis not present

## 2019-09-04 DIAGNOSIS — Z87891 Personal history of nicotine dependence: Secondary | ICD-10-CM | POA: Diagnosis not present

## 2019-09-04 LAB — CBC
HCT: 34.3 % — ABNORMAL LOW (ref 36.0–46.0)
Hemoglobin: 11.5 g/dL — ABNORMAL LOW (ref 12.0–15.0)
MCH: 28.8 pg (ref 26.0–34.0)
MCHC: 33.5 g/dL (ref 30.0–36.0)
MCV: 86 fL (ref 80.0–100.0)
Platelets: 228 10*3/uL (ref 150–400)
RBC: 3.99 MIL/uL (ref 3.87–5.11)
RDW: 13.4 % (ref 11.5–15.5)
WBC: 4.8 10*3/uL (ref 4.0–10.5)
nRBC: 0 % (ref 0.0–0.2)

## 2019-09-04 LAB — COMPREHENSIVE METABOLIC PANEL
ALT: 28 U/L (ref 0–44)
AST: 44 U/L — ABNORMAL HIGH (ref 15–41)
Albumin: 3.1 g/dL — ABNORMAL LOW (ref 3.5–5.0)
Alkaline Phosphatase: 90 U/L (ref 38–126)
Anion gap: 8 (ref 5–15)
BUN: 13 mg/dL (ref 6–20)
CO2: 24 mmol/L (ref 22–32)
Calcium: 8.4 mg/dL — ABNORMAL LOW (ref 8.9–10.3)
Chloride: 107 mmol/L (ref 98–111)
Creatinine, Ser: 0.58 mg/dL (ref 0.44–1.00)
GFR calc Af Amer: >60 mL/min (ref >60)
GFR calc non Af Amer: >60 mL/min (ref >60)
Glucose, Bld: 97 mg/dL (ref 70–99)
Potassium: 3.1 mmol/L — ABNORMAL LOW (ref 3.5–5.1)
Sodium: 139 mmol/L (ref 135–145)
Total Bilirubin: 0.5 mg/dL (ref 0.3–1.2)
Total Protein: 6.5 g/dL (ref 6.5–8.1)

## 2019-09-04 LAB — TROPONIN I (HIGH SENSITIVITY): Troponin I (High Sensitivity): 3 ng/L (ref <18)

## 2019-09-04 MED ORDER — POTASSIUM CHLORIDE CRYS ER 20 MEQ PO TBCR
40.0000 meq | EXTENDED_RELEASE_TABLET | Freq: Once | ORAL | Status: AC
  Administered 2019-09-04: 40 meq via ORAL
  Filled 2019-09-04: qty 2

## 2019-09-04 MED ORDER — ALBUTEROL SULFATE HFA 108 (90 BASE) MCG/ACT IN AERS: 2.0000 | INHALATION_SPRAY | Freq: Four times a day (QID) | RESPIRATORY_TRACT | 1 refills | Status: DC | PRN

## 2019-09-04 MED ORDER — IOHEXOL 350 MG/ML SOLN
75.0000 mL | Freq: Once | INTRAVENOUS | Status: AC | PRN
  Administered 2019-09-04: 75 mL via INTRAVENOUS

## 2019-09-04 MED ORDER — SODIUM CHLORIDE 0.9 % IV BOLUS
500.0000 mL | Freq: Once | INTRAVENOUS | Status: AC
  Administered 2019-09-04: 500 mL via INTRAVENOUS

## 2019-09-04 MED ORDER — ONDANSETRON 4 MG PO TBDP: 4.0000 mg | ORAL_TABLET | Freq: Three times a day (TID) | ORAL | 0 refills | Status: DC | PRN

## 2019-09-04 MED ORDER — ONDANSETRON HCL 4 MG PO TABS: 4.0000 mg | ORAL_TABLET | Freq: Every day | ORAL | 0 refills | Status: DC | PRN

## 2019-09-04 MED ORDER — DOXYCYCLINE HYCLATE 100 MG PO TABS: 100.0000 mg | ORAL_TABLET | Freq: Two times a day (BID) | ORAL | 0 refills | Status: DC

## 2019-09-04 NOTE — Progress Notes (Signed)
This visit type was conducted due to national recommendations for restrictions regarding the COVID-19 pandemic (e.g. social distancing).  This format is felt to be most appropriate for this patient at this time.  All issues noted in this document were discussed and addressed.  No physical exam was performed (except for noted visual exam findings with Video Visits). Virtual Visit via Video Note  I connected with@  on 09/04/19 at  4:00 PM EDT by a video enabled telemedicine application and verified that I am speaking with the correct person using two identifiers.  Location patient: home Location provider:work  Persons participating in the virtual visit: patient, provider  I discussed the limitations of evaluation and management by telemedicine and the availability of in person appointments. The patient expressed understanding and agreed to proceed.   HPI:  Feeling 'little'  better today.   She does complain of SOB with activity over past 4-5 days, unchanged. Notes that she will start to cough when takes deep breath.  Some wheezing. Now has congestion, nausea. Sore throat and left ear pain has resolved on Augmentin. No leg swelling, orthopnea, CP, left arm pain, dizziness, palpitations.   H/o BL edema - typically uses HCTZ, hasnt used medication in days.   No h/o asthma. Former smoker.  Using her son's nebulizer which helped.  Continues to have fever at night, 101.87F last night. Fever has been on going 11 days.   Never went to urgent care  ROS: See pertinent positives and negatives per HPI.  Past Medical History:  Diagnosis Date  . Blood in urine 2014  . Breast mass 2013  . BV (bacterial vaginosis)   . Edema    legs  . Hypertension   . Thyroid disease     Past Surgical History:  Procedure Laterality Date  . BREAST BIOPSY Right 2013   benign - fatty tissue  . BREAST BIOPSY Left 06-12-13   fibroadenoma /core biopsy  . BREAST BIOPSY Right 06-17-13   fibrocystic /stereo biopsy   . CESAREAN SECTION  2007  . DILATION AND CURETTAGE OF UTERUS    . FOOT SURGERY Left    01/2017  . ROUX-EN-Y PROCEDURE  03/04/2014   Dr. [place]  . TUBAL LIGATION      Family History  Problem Relation Age of Onset  . Arthritis Mother   . Diabetes Father   . Hypertension Father   . Polycystic ovary syndrome Sister   . Cancer Maternal Grandmother 52       breast  . Breast cancer Neg Hx   . Ovarian cancer Neg Hx     SOCIAL HX: former smoker   Current Outpatient Medications:  .  amoxicillin-clavulanate (AUGMENTIN) 875-125 MG tablet, Take 1 tablet by mouth 2 (two) times daily., Disp: 14 tablet, Rfl: 0 .  hydrochlorothiazide (HYDRODIURIL) 25 MG tablet, TAKE ONE TABLET BY MOUTH EVERY DAY, Disp: 90 tablet, Rfl: 3 .  lidocaine (XYLOCAINE) 2 % solution, Use as directed 10 mLs in the mouth or throat every 3 (three) hours as needed for mouth pain (gargle; may spit or swallow)., Disp: 100 mL, Rfl: 0  EXAM:  VITALS per patient if applicable:  GENERAL: alert, oriented, appears well and in no acute distress. Breathing normally.   HEENT: atraumatic, conjunttiva clear, no obvious abnormalities on inspection of external nose and ears  NECK: normal movements of the head and neck  LUNGS: on inspection no signs of respiratory distress, breathing rate appears normal, no obvious gross SOB, gasping or wheezing  CV:  no obvious cyanosis  MS: moves all visible extremities without noticeable abnormality  PSYCH/NEURO: pleasant and cooperative, no obvious depression or anxiety, speech and thought processing grossly intact  ASSESSMENT AND PLAN:  Discussed the following assessment and plan:  Problem List Items Addressed This Visit    None      -we discussed possible serious and likely etiologies, options for evaluation and workup, limitations of telemedicine visit vs in person visit, treatment, treatment risks and precautions. Pt prefers to treat via telemedicine empirically rather then risking  or undertaking an in person visit at this moment. Patient agrees to seek prompt in person care if worsening, new symptoms arise, or if is not improving with treatment.   I discussed the assessment and treatment plan with the patient. The patient was provided an opportunity to ask questions and all were answered. The patient agreed with the plan and demonstrated an understanding of the instructions.   The patient was advised to call back or seek an in-person evaluation if the symptoms worsen or if the condition fails to improve as anticipated.   Mable Paris, FNP

## 2019-09-04 NOTE — ED Provider Notes (Signed)
[facility] Emergency Department Provider Note  ____________________________________________   First MD Initiated Contact with Patient 09/04/19 2202     (approximate)  I have reviewed the triage vital signs and the nursing notes.   HISTORY  Chief Complaint Shortness of Breath    HPI [name] is a 48 y.o. female who is coronavirus positive who presents for further evaluation for shortness of breath.  Patient said she developed symptoms on 9/6.  Over her family members also tested positive after going to the beach.  She has had multiple symptoms including fevers, nausea, decreased oral intake, shortness of breath, cough.  She really developed shortness of breath today, moderate, nothing makes it better, worse with exertion.  Patient was already sought by her primary care doctor and had a CT PE done that was negative for pulmonary embolism but showed signs of her coronavirus.  No other complications on the CT scan.  They suggested that she come into the ER to have an oxygen level checked.  Past Medical History:  Diagnosis Date   Blood in urine 2014   Breast mass 2013   BV (bacterial vaginosis)    Edema    legs   Hypertension    Thyroid disease     Patient Active Problem List   Diagnosis Date Noted   SOB (shortness of breath) 09/04/2019   Otitis media 08/30/2019   Suspected Covid-19 Virus Infection 08/29/2019   Leg swelling 09/03/2018   Acute sinusitis 06/15/2017   Climacteric 03/30/2017   Varicose veins 11/17/2015   Hearing loss 05/25/2015   Status post tubal ligation 09/22/2014   Obstructive sleep apnea 06/27/2013   Routine general medical examination at a health care facility 05/15/2013   Goiter, nontoxic, multinodular 02/25/2013   Obesity 11/12/2012   Hypertension 05/09/2012    Past Surgical History:  Procedure Laterality Date   BREAST BIOPSY Right 2013   benign - fatty tissue   BREAST BIOPSY Left 06-12-13   fibroadenoma /core biopsy   BREAST BIOPSY Right 06-17-13   fibrocystic /stereo biopsy   CESAREAN SECTION  2007   DILATION AND CURETTAGE OF UTERUS     FOOT SURGERY Left    01/2017   ROUX-EN-Y PROCEDURE  03/04/2014   Dr. [place]   TUBAL LIGATION      Prior to Admission medications   Medication Sig Start Date End Date Taking? Authorizing Provider  albuterol (VENTOLIN HFA) 108 (90 Base) MCG/ACT inhaler Inhale 2 puffs into the lungs every 6 (six) hours as needed for wheezing or shortness of breath. 09/04/19   Burnard Hawthorne, FNP  amoxicillin-clavulanate (AUGMENTIN) 875-125 MG tablet Take 1 tablet by mouth 2 (two) times daily. 08/30/19   Burnard Hawthorne, FNP  doxycycline (VIBRA-TABS) 100 MG tablet Take 1 tablet (100 mg total) by mouth 2 (two) times daily. 09/04/19   Burnard Hawthorne, FNP  hydrochlorothiazide (HYDRODIURIL) 25 MG tablet TAKE ONE TABLET BY MOUTH EVERY DAY 09/07/18   Burnard Hawthorne, FNP  lidocaine (XYLOCAINE) 2 % solution Use as directed 10 mLs in the mouth or throat every 3 (three) hours as needed for mouth pain (gargle; may spit or swallow). 08/30/19   Burnard Hawthorne, FNP  ondansetron (ZOFRAN ODT) 4 MG disintegrating tablet Take 1 tablet (4 mg total) by mouth every 8 (eight) hours as needed for nausea or vomiting. 09/04/19   Burnard Hawthorne, FNP    Allergies Tape and Avelox [moxifloxacin hcl in nacl]  Family History  Problem Relation  Age of Onset   Arthritis Mother    Diabetes Father    Hypertension Father    Polycystic ovary syndrome Sister    Cancer Maternal Grandmother 62       breast   Breast cancer Neg Hx    Ovarian cancer Neg Hx     Social History Social History   Tobacco Use   Smoking status: Former Smoker   Smokeless tobacco: Never Used   Tobacco comment: quit 2006  Substance Use Topics   Alcohol use: Yes    Comment: rare   Drug use: No      Review of Systems Constitutional: Positive fever Eyes: No visual changes. ENT:  No sore throat. Cardiovascular: Denies chest pain. Respiratory cough, shortness of breath Gastrointestinal: No abdominal pain.  Positive nausea no vomiting.  No diarrhea.  No constipation. Genitourinary: Negative for dysuria. Musculoskeletal: Negative for back pain. Skin: Negative for rash. Neurological: Negative for headaches, focal weakness or numbness. All other ROS negative ____________________________________________   PHYSICAL EXAM:  VITAL SIGNS: ED Triage Vitals  Enc Vitals Group     BP 09/04/19 2141 120/60     Pulse Rate 09/04/19 2141 78     Resp 09/04/19 2141 16     Temp 09/04/19 2141 99.5 F (37.5 C)     Temp Source 09/04/19 2141 Oral     SpO2 09/04/19 2141 94 %     Weight 09/04/19 2136 260 lb (117.9 kg)     Height 09/04/19 2136 5\' 5"  (1.651 m)     Head Circumference --      Peak Flow --      Pain Score 09/04/19 2136 5     Pain Loc --      Pain Edu? --      Excl. in [place]? --     Constitutional: Alert and oriented. Well appearing and in no acute distress. Eyes: Conjunctivae are normal. EOMI. Head: Atraumatic. Nose: No congestion/rhinnorhea. Mouth/Throat: Mucous membranes are moist.   Neck: No stridor. Trachea Midline. FROM Cardiovascular: Normal rate, regular rhythm. Grossly normal heart sounds.  Good peripheral circulation. Respiratory: Normal respiratory effort.  No retractions. Lungs CTAB. Gastrointestinal: Soft and nontender. No distention. No abdominal bruits.  Musculoskeletal: No lower extremity tenderness nor edema.  No joint effusions. Neurologic:  Normal speech and language. No gross focal neurologic deficits are appreciated.  Skin:  Skin is warm, dry and intact. No rash noted. Psychiatric: Mood and affect are normal. Speech and behavior are normal. GU: Deferred   ____________________________________________   LABS (all labs ordered are listed, but only abnormal results are displayed)  Labs Reviewed  CBC - Abnormal; Notable for the following  components:      Result Value   Hemoglobin 11.5 (*)    HCT 34.3 (*)    All other components within normal limits  COMPREHENSIVE METABOLIC PANEL - Abnormal; Notable for the following components:   Potassium 3.1 (*)    Calcium 8.4 (*)    Albumin 3.1 (*)    AST 44 (*)    All other components within normal limits  POC URINE PREG, ED  TROPONIN I (HIGH SENSITIVITY)  TROPONIN I (HIGH SENSITIVITY)   ____________________________________________   ED ECG REPORT I, Vanessa Three Way, the attending physician, personally viewed and interpreted this ECG.  EKG is normal sinus rate of 68, no ST elevation, no T wave inversion, normal intervals ____________________________________________  RADIOLOGY Official radiology report(s): Ct Angio Chest W/cm &/or Wo Cm  Result Date: 09/04/2019 CLINICAL DATA:  Chest pain and shortness of breath, recent COVID-19 positivity EXAM: CT ANGIOGRAPHY CHEST WITH CONTRAST TECHNIQUE: Multidetector CT imaging of the chest was performed using the standard protocol during bolus administration of intravenous contrast. Multiplanar CT image reconstructions and MIPs were obtained to evaluate the vascular anatomy. CONTRAST:  65mL OMNIPAQUE IOHEXOL 350 MG/ML SOLN COMPARISON:  None. FINDINGS: Cardiovascular: Thoracic aorta is well visualize without aneurysmal dilatation or dissection. The heart is not significantly enlarged. The pulmonary artery shows a normal branching pattern. No definitive filling defects are noted to suggest pulmonary embolism. Mediastinum/Nodes: Thoracic inlet is within normal limits. No significant hilar or mediastinal adenopathy is noted. The esophagus is within normal limits. Lungs/Pleura: The lungs demonstrate diffuse patchy ground-glass infiltrates bilaterally. No effusion or pneumothorax is seen. No parenchymal nodules are noted. Upper Abdomen: Visualized upper abdomen is unremarkable. Musculoskeletal: No chest wall abnormality. No acute or significant osseous  findings. Review of the MIP images confirms the above findings. IMPRESSION: Diffuse bilateral ground-glass infiltrates as described consistent with the patient's given clinical history of COVID-19 positivity. No evidence of pulmonary emboli. These results will be called to the ordering clinician or representative by the Radiologist Assistant, and communication documented in the PACS or zVision Dashboard. Electronically Signed   By: Inez Catalina M.D.   On: 09/04/2019 19:13    ____________________________________________   PROCEDURES  Procedure(s) performed (including Critical Care):  Procedures   ____________________________________________   INITIAL IMPRESSION / ASSESSMENT AND PLAN / ED COURSE  [name] was evaluated in Emergency Department on 09/04/2019 for the symptoms described in the history of present illness. She was evaluated in the context of the global COVID-19 pandemic, which necessitated consideration that the patient might be at risk for infection with the SARS-CoV-2 virus that causes COVID-19. Institutional protocols and algorithms that pertain to the evaluation of patients at risk for COVID-19 are in a state of rapid change based on information released by regulatory bodies including the CDC and federal and state organizations. These policies and algorithms were followed during the patient's care in the ED.    Patient presents with day 10 of coronavirus with new shortness of breath that started yesterday.  Patient had a CT scan done today that showed no evidence of pulmonary embolism.  Does show signs consistent with COVID-19.  Will get EKG cardiac markers to evaluate for ACS although seems less likely.  Will get labs evaluate for electrolyte abnormalities.  Patient denies any abdominal pain to suggest abdominal infection.  Patient had a CT scan today that did not show any evidence of pulmonary embolism.  Does have bilateral infiltrates consistent with COVID-19.  Labs are  reassuring with normal white count.  Troponin rules her out.  Hemoglobin slightly low 11.5.  K was 3.1 will give 40 of p.o. K.  Discussed with patient and she was able to ambulate and desatted to 94%.  When I was talking to patient she was 98% in the room.  She is not requiring oxygen she does not meet requirement for admission or steroids.  I had lengthy discussion with patient that I cannot predict the future and that she could develop worsening shortness of breath and then to return to the ER for repeat saturation check.  I discussed the provisional nature of ED diagnosis, the treatment so far, the ongoing plan of care, follow up appointments and return precautions with the patient and any family or support people present. They expressed understanding and agreed with the plan, discharged home.   ____________________________________________  FINAL CLINICAL IMPRESSION(S) / ED DIAGNOSES   Final diagnoses:  [id number] virus detected      MEDICATIONS GIVEN DURING THIS VISIT:  Medications  potassium chloride SA (K-DUR) CR tablet 40 mEq (has no administration in time range)  sodium chloride 0.9 % bolus 500 mL (500 mLs Intravenous New Bag/Given 09/04/19 2225)     ED Discharge Orders    None       Note:  This document was prepared using Dragon voice recognition software and may include unintentional dictation errors.   Vanessa , MD 09/04/19 [phone]

## 2019-09-04 NOTE — Telephone Encounter (Signed)
Called pt on mobile reviewed CT scan 09/04/19 +COVID 19 she went on family vacation 1st week of 08/2019 [facility] passed yesterday from [place] + and 9 family members tested +   Disc with pt Arnett tonight. Patient is having cough, fever to 101-102F, sob PCP after disc with Dr. Laurelyn Sickle pulm rec pt go to hospital c/w ARDS due to abnormal CT chest from 09/04/2019   --> rec ABG, O2 sat and further management at the hospital pt going now and PCP will call ED to let know she is coming   Pt will need to be out of work (I.e need note) still and will need a note from PCP until at least 09/10/19 or possibly longer given not better    [place]

## 2019-09-04 NOTE — Telephone Encounter (Signed)
See my separate telephone note to patient , Dr Humphrey Rolls as well.   Judson Roch,  Would you call patient and see how we can help provide work note? ( see [place]'s note below)   Please see what she needs. Thank you Judson Roch!

## 2019-09-04 NOTE — Assessment & Plan Note (Signed)
Overall, patient appears to be waxing waning regards to symptoms.  Shortness of breath does appear more exertional.  Likely deconditioning, and/ or virus itself is playing a role.  We discussed however coagulation problems in the novel virus and jointly agreed that pursuing CT angiogram to ensure no pulmonary embolism or pneumonia (which may be reason for persistent fevers) is reasonable.  Have ordered a stat CT angiogram at this time.  Also discussed expectant management in regards to the length of her symptoms  fever, shortness of breath.  We jointly agreed that a pulmonology consult for further guidance would be appropriate.  I placed this referral.  I went ahead and sent in doxycycline patient this evening and asked her to hold this medication until  CT angio results are back.  Certainly if patient has pneumonia, and I suspect may be atypical PNA.  after she completed her Augmentin , I would then advise her to start doxycycline.  Advised her to use albuterol and to stay hypervigilant.  Patient will let us know how she is doing.

## 2019-09-04 NOTE — ED Triage Notes (Signed)
Pt arrives to ED via POV from home with c/o SHOB, fever, and non-productive cough x10 days. Pt was tested for COVID and found to be positive on Tuesday, Sept 8th. Pt reports nausea and diarrhea. Pt states she initially had a sore throat and earache which has subsided. Pt reports taking 1000mg  Tylenol around 830 PTA. Pt is A&O, in NAD; RR even, regular, and unlabored.

## 2019-09-04 NOTE — Discharge Instructions (Addendum)
Your work-up was reassuring.  Your oxygen levels here were normal.  You should remain quarantine at home until he did not have symptoms for 3 days.  If you develop worsening shortness of breath you should return to the ER for evaluation.  Potassium was slightly low.

## 2019-09-04 NOTE — Patient Instructions (Addendum)
Use albuterol every 6 hours for first 24 hours to get good medication into the lungs and loosen congestion; after, you may use as needed and eventually stop all together when cough resolves.  Stat CT Angio.   Today we discussed referrals, orders. pulmonology  I have placed these orders in the system for you.  Please be sure to give [place] a call if you have not heard from our office regarding this. We should hear from [place] within ONE week with information regarding your appointment. If not, please let me know immediately.    Let us know how you are doing and certainly if you develop any new or worsening symptoms.

## 2019-09-04 NOTE — ED Notes (Signed)
Pt's NP called to report that pt COVID + 10days ago; has been having "mild" SHOB last 3-4 days; CT angiogram performed which showed bilat infiltrates; Dr Humphrey Rolls with pulmonology concerned over "early ARDS"

## 2019-09-04 NOTE — ED Notes (Signed)
This RN ambulated pt around the room twice with oxygen sats remaining at 94% on RA. Pt tolerated well with no issue.

## 2019-09-04 NOTE — Telephone Encounter (Signed)
I spoke with patient & she still have a fever. She was also doing some coughing. She didn't know if her fever should be lasting this long. I have made her f/u with Joycelyn Schmid today at 4.

## 2019-09-04 NOTE — Telephone Encounter (Signed)
Spoke with patient and on call Dr Aundra Dubin  I also spoke with Dr Simonne Maffucci ( pulmonology) regarding patient's symptoms and CT Angio.   Advised patient to go to [facility] ED for evaluation as this could be early ARDS or patient may just need ED evaluation ( IV steriods, IV azithromycin) etc after consulting with Dr Humphrey Rolls.   Given report to [facility] triage and they are very aware that covid positive patient is coming by private vehicle.   Patient verbalized understanding of all and stated she would go shortly to ED.

## 2019-09-04 NOTE — Telephone Encounter (Signed)
Please call and check on pt How is feeling ? Does she need a virtual follow up here ?

## 2019-09-05 ENCOUNTER — Telehealth: Payer: Self-pay | Admitting: Internal Medicine

## 2019-09-05 NOTE — Telephone Encounter (Signed)
I spoke with patient & she stated that she was feeling some better today. I advised per Joycelyn Schmid that the albuterol inhaler may be most helpful at this point. Patient stated she would like work note & I have provided in patient;s mychart. I asked that she call back if she needed anything else from [place].

## 2019-09-05 NOTE — Telephone Encounter (Signed)
I know you called pt after her ER visit.  Reviewed chart.  Please call and check on her 09/06/19.  Also, schedule a f/u appt (ER f/u) for her within one week to confirm pt continuing to improve.  Thanks.

## 2019-09-06 ENCOUNTER — Encounter: Payer: BLUE CROSS/BLUE SHIELD | Admitting: Family

## 2019-09-09 NOTE — Telephone Encounter (Signed)
I called to check on patient to see how she was doing. Patient stated that she was feeling better, but definitely not back to her normal. She stated that she felt a lot better this morning after resting extensively last night. She said that she felt poorly yesterday afternoon, but after going to bed early & getting lots of rest she was better. She mostly complained of being tired & worn out feeling. She was unfortunately unable to go to her Grandmother's funeral due to [place] things have been stressful of course on top of everything else.  I asked patient to please call us if she needed anything else & that we had providers here that would be more than happy to see her. Patient was grateful for our call.

## 2019-10-17 DIAGNOSIS — Z23 Encounter for immunization: Secondary | ICD-10-CM | POA: Diagnosis not present

## 2019-12-05 DIAGNOSIS — Z1231 Encounter for screening mammogram for malignant neoplasm of breast: Secondary | ICD-10-CM | POA: Diagnosis not present

## 2019-12-05 LAB — HM MAMMOGRAPHY

## 2019-12-09 ENCOUNTER — Telehealth: Payer: Self-pay | Admitting: Family

## 2019-12-09 ENCOUNTER — Ambulatory Visit (INDEPENDENT_AMBULATORY_CARE_PROVIDER_SITE_OTHER): Payer: BC Managed Care – PPO | Admitting: Family

## 2019-12-09 ENCOUNTER — Encounter: Payer: Self-pay | Admitting: Family

## 2019-12-09 ENCOUNTER — Other Ambulatory Visit: Payer: Self-pay

## 2019-12-09 VITALS — Ht 65.0 in | Wt 270.0 lb

## 2019-12-09 DIAGNOSIS — I10 Essential (primary) hypertension: Secondary | ICD-10-CM | POA: Diagnosis not present

## 2019-12-09 DIAGNOSIS — L659 Nonscarring hair loss, unspecified: Secondary | ICD-10-CM

## 2019-12-09 DIAGNOSIS — R928 Other abnormal and inconclusive findings on diagnostic imaging of breast: Secondary | ICD-10-CM

## 2019-12-09 DIAGNOSIS — Z0000 Encounter for general adult medical examination without abnormal findings: Secondary | ICD-10-CM

## 2019-12-09 NOTE — Assessment & Plan Note (Signed)
Mammogram requires additional imaging which is been ordered.Advised SBE. Labs pending.

## 2019-12-09 NOTE — Telephone Encounter (Signed)
Call [facility] breast [phone] I discussed results of mammogram with patient this morning.    Her report looks like she needs a targeted left ultrasound and also recall for spot compression magnification MLO and cc views of right and left breast It  is also appears calcifications were seen in right breast.  Do I need to put orders in for this?  Please let us know how to facilitate this for patient

## 2019-12-09 NOTE — Patient Instructions (Addendum)
Due for Pap smear in 2021- would you make an appt here or with [facility].   I have ordered =bilateral diagnostic mammogram as well as bilateral targeted ultrasounds.  My nurse has been in touch with [facility] in regards to this.  Please let me know if there are any problems in getting scheduled and if it is not scheduled promptly.  This is very important.   Stay safe!    Health Maintenance, Female Adopting a healthy lifestyle and getting preventive care are important in promoting health and wellness. Ask your health care provider about:  The right schedule for you to have regular tests and exams.  Things you can do on your own to prevent diseases and keep yourself healthy. What should I know about diet, weight, and exercise? Eat a healthy diet   Eat a diet that includes plenty of vegetables, fruits, low-fat dairy products, and lean protein.  Do not eat a lot of foods that are high in solid fats, added sugars, or sodium. Maintain a healthy weight Body mass index (BMI) is used to identify weight problems. It estimates body fat based on height and weight. Your health care provider can help determine your BMI and help you achieve or maintain a healthy weight. Get regular exercise Get regular exercise. This is one of the most important things you can do for your health. Most adults should:  Exercise for at least 150 minutes each week. The exercise should increase your heart rate and make you sweat (moderate-intensity exercise).  Do strengthening exercises at least twice a week. This is in addition to the moderate-intensity exercise.  Spend less time sitting. Even light physical activity can be beneficial. Watch cholesterol and blood lipids Have your blood tested for lipids and cholesterol at 48 years of age, then have this test every 5 years. Have your cholesterol levels checked more often if:  Your lipid or cholesterol levels are high.  You are older than 48 years of age.  You are at  high risk for heart disease. What should I know about cancer screening? Depending on your health history and family history, you may need to have cancer screening at various ages. This may include screening for:  Breast cancer.  Cervical cancer.  Colorectal cancer.  Skin cancer.  Lung cancer. What should I know about heart disease, diabetes, and high blood pressure? Blood pressure and heart disease  High blood pressure causes heart disease and increases the risk of stroke. This is more likely to develop in people who have high blood pressure readings, are of African descent, or are overweight.  Have your blood pressure checked: ? Every 3-5 years if you are 53-26 years of age. ? Every year if you are 37 years old or older. Diabetes Have regular diabetes screenings. This checks your fasting blood sugar level. Have the screening done:  Once every three years after age 28 if you are at a normal weight and have a low risk for diabetes.  More often and at a younger age if you are overweight or have a high risk for diabetes. What should I know about preventing infection? Hepatitis B If you have a higher risk for hepatitis B, you should be screened for this virus. Talk with your health care provider to find out if you are at risk for hepatitis B infection. Hepatitis C Testing is recommended for:  Everyone born from [birth year] through 1965.  Anyone with known risk factors for hepatitis C. Sexually transmitted infections (STIs)  Get  screened for STIs, including gonorrhea and chlamydia, if: ? You are sexually active and are younger than 48 years of age. ? You are older than 48 years of age and your health care provider tells you that you are at risk for this type of infection. ? Your sexual activity has changed since you were last screened, and you are at increased risk for chlamydia or gonorrhea. Ask your health care provider if you are at risk.  Ask your health care provider about whether  you are at high risk for HIV. Your health care provider may recommend a prescription medicine to help prevent HIV infection. If you choose to take medicine to prevent HIV, you should first get tested for HIV. You should then be tested every 3 months for as long as you are taking the medicine. Pregnancy  If you are about to stop having your period (premenopausal) and you may become pregnant, seek counseling before you get pregnant.  Take 400 to 800 micrograms (mcg) of folic acid every day if you become pregnant.  Ask for birth control (contraception) if you want to prevent pregnancy. Osteoporosis and menopause Osteoporosis is a disease in which the bones lose minerals and strength with aging. This can result in bone fractures. If you are 35 years old or older, or if you are at risk for osteoporosis and fractures, ask your health care provider if you should:  Be screened for bone loss.  Take a calcium or vitamin D supplement to lower your risk of fractures.  Be given hormone replacement therapy (HRT) to treat symptoms of menopause. Follow these instructions at home: Lifestyle  Do not use any products that contain nicotine or tobacco, such as cigarettes, e-cigarettes, and chewing tobacco. If you need help quitting, ask your health care provider.  Do not use street drugs.  Do not share needles.  Ask your health care provider for help if you need support or information about quitting drugs. Alcohol use  Do not drink alcohol if: ? Your health care provider tells you not to drink. ? You are pregnant, may be pregnant, or are planning to become pregnant.  If you drink alcohol: ? Limit how much you use to 0-1 drink a day. ? Limit intake if you are breastfeeding.  Be aware of how much alcohol is in your drink. In the [place] one drink equals one 12 oz bottle of beer (355 mL), one 5 oz glass of wine (148 mL), or one 1 oz glass of hard liquor (44 mL). General instructions  Schedule regular  health, dental, and eye exams.  Stay current with your vaccines.  Tell your health care provider if: ? You often feel depressed. ? You have ever been abused or do not feel safe at home. Summary  Adopting a healthy lifestyle and getting preventive care are important in promoting health and wellness.  Follow your health care provider's instructions about healthy diet, exercising, and getting tested or screened for diseases.  Follow your health care provider's instructions on monitoring your cholesterol and blood pressure. This information is not intended to replace advice given to you by your health care provider. Make sure you discuss any questions you have with your health care provider. Document Released: 06/20/2011 Document Revised: 11/28/2018 Document Reviewed: 11/28/2018 Elsevier Patient Education  2020 [employer].

## 2019-12-09 NOTE — Telephone Encounter (Signed)
I have noted & faxed.

## 2019-12-09 NOTE — Telephone Encounter (Signed)
Ordered Would you call bac kand ensure they are correct ? Are they calling pt?

## 2019-12-09 NOTE — Telephone Encounter (Signed)
Yes please note that on fax that [facility] needs to reach out to pt to schedule

## 2019-12-09 NOTE — Assessment & Plan Note (Signed)
Etiology nonspecific at this time, pending lab evaluation.  Expressed to patient if labs are unrevealing, I would advise dermatology consult which she was agreed

## 2019-12-09 NOTE — Assessment & Plan Note (Signed)
Controlled, continue current regimen

## 2019-12-09 NOTE — Progress Notes (Signed)
Virtual Visit via Video Note  I connected with@  on 12/09/19 at  8:30 AM EST by a video enabled telemedicine application and verified that I am speaking with the correct person using two identifiers.  Location patient: home Location provider:home office Persons participating in the virtual visit: patient, provider  I discussed the limitations of evaluation and management by telemedicine and the availability of in person appointments. The patient expressed understanding and agreed to proceed.   HPI: COVID + in 08/2019. No cough, SOB.  Concern for hair loss, and wanders if from [place]. Feels thinning ; when washes and feels coming out heavy. No bald spots. No scalp itching.   Here for CPE.  Mammogram- needs additional imaging. Had followed with Dr Thurman Coyer  Post bariatric surgery Taking iron OTC   Follows with [facility]; no h/o skin cancer.   ROS: See pertinent positives and negatives per HPI.  Past Medical History:  Diagnosis Date  . Blood in urine 2014  . Breast mass 2013  . BV (bacterial vaginosis)   . Edema    legs  . Hypertension   . Thyroid disease     Past Surgical History:  Procedure Laterality Date  . BREAST BIOPSY Right 2013   benign - fatty tissue  . BREAST BIOPSY Left 06-12-13   fibroadenoma /core biopsy  . BREAST BIOPSY Right 06-17-13   fibrocystic /stereo biopsy  . CESAREAN SECTION  2007  . DILATION AND CURETTAGE OF UTERUS    . FOOT SURGERY Left    01/2017  . ROUX-EN-Y PROCEDURE  03/04/2014   Dr. [place]  . TUBAL LIGATION      Family History  Problem Relation Age of Onset  . Arthritis Mother   . Diabetes Father   . Hypertension Father   . Polycystic ovary syndrome Sister   . Hypothyroidism Sister   . Cancer Maternal Grandmother 17       breast  . Ovarian cancer Neg Hx   . Colon cancer Neg Hx     SOCIAL HX: former smoker   Current Outpatient Medications:  .  hydrochlorothiazide (HYDRODIURIL) 25 MG tablet, TAKE ONE TABLET BY MOUTH EVERY  DAY, Disp: 90 tablet, Rfl: 3  EXAM:  VITALS per patient if applicable: BP Readings from Last 3 Encounters:  09/04/19 114/68  09/03/18 122/84  08/08/17 108/67     GENERAL: alert, oriented, appears well and in no acute distress  HEENT: atraumatic, conjunttiva clear, no obvious abnormalities on inspection of external nose and ears  NECK: normal movements of the head and neck  LUNGS: on inspection no signs of respiratory distress, breathing rate appears normal, no obvious gross SOB, gasping or wheezing  CV: no obvious cyanosis  MS: moves all visible extremities without noticeable abnormality  PSYCH/NEURO: pleasant and cooperative, no obvious depression or anxiety, speech and thought processing grossly intact  ASSESSMENT AND PLAN:  Discussed the following assessment and plan:  Routine general medical examination at a health care facility - Plan: CBC with Differential/Platelet, Comprehensive metabolic panel-FUTURE, Hemoglobin A1c, Lipid panel-future, TSH-future, VITAMIN D 25 Hydroxy (Vit-D Deficiency, Fractures), B12 and Folate Panel-macrocytic anemia, IBC + Ferritin + TIBC  Hair loss Problem List Items Addressed This Visit      Other   Routine general medical examination at a health care facility - Primary    Mammogram requires additional imaging which is been ordered.Advised SBE. Labs pending.      Relevant Orders   CBC with Differential/Platelet   Comprehensive metabolic panel-FUTURE  Hemoglobin A1c   Lipid panel-future   TSH-future   VITAMIN D 25 Hydroxy (Vit-D Deficiency, Fractures)   B12 and Folate Panel-macrocytic anemia   IBC + Ferritin + TIBC   Hair loss    Etiology nonspecific at this time, pending lab evaluation.  Expressed to patient if labs are unrevealing, I would advise dermatology consult which she was agreed         -we discussed possible serious and likely etiologies, options for evaluation and workup, limitations of telemedicine visit vs in person  visit, treatment, treatment risks and precautions. Pt prefers to treat via telemedicine empirically rather then risking or undertaking an in person visit at this moment. Patient agrees to seek prompt in person care if worsening, new symptoms arise, or if is not improving with treatment.   I discussed the assessment and treatment plan with the patient. The patient was provided an opportunity to ask questions and all were answered. The patient agreed with the plan and demonstrated an understanding of the instructions.   The patient was advised to call back or seek an in-person evaluation if the symptoms worsen or if the condition fails to improve as anticipated.   Mable Paris, FNP

## 2019-12-20 HISTORY — PX: BREAST LUMPECTOMY: SHX2

## 2019-12-23 DIAGNOSIS — R922 Inconclusive mammogram: Secondary | ICD-10-CM | POA: Diagnosis not present

## 2019-12-23 DIAGNOSIS — R921 Mammographic calcification found on diagnostic imaging of breast: Secondary | ICD-10-CM | POA: Diagnosis not present

## 2019-12-23 DIAGNOSIS — N6322 Unspecified lump in the left breast, upper inner quadrant: Secondary | ICD-10-CM | POA: Diagnosis not present

## 2019-12-23 DIAGNOSIS — R928 Other abnormal and inconclusive findings on diagnostic imaging of breast: Secondary | ICD-10-CM | POA: Diagnosis not present

## 2019-12-24 ENCOUNTER — Other Ambulatory Visit (INDEPENDENT_AMBULATORY_CARE_PROVIDER_SITE_OTHER): Payer: BC Managed Care – PPO

## 2019-12-24 ENCOUNTER — Telehealth: Payer: Self-pay | Admitting: Family

## 2019-12-24 ENCOUNTER — Other Ambulatory Visit: Payer: Self-pay

## 2019-12-24 DIAGNOSIS — Z0000 Encounter for general adult medical examination without abnormal findings: Secondary | ICD-10-CM | POA: Diagnosis not present

## 2019-12-24 LAB — CBC WITH DIFFERENTIAL/PLATELET
Basophils Absolute: 0.1 10*3/uL (ref 0.0–0.1)
Basophils Relative: 0.8 % (ref 0.0–3.0)
Eosinophils Absolute: 0.3 10*3/uL (ref 0.0–0.7)
Eosinophils Relative: 3.6 % (ref 0.0–5.0)
HCT: 37.6 % (ref 36.0–46.0)
Hemoglobin: 12.4 g/dL (ref 12.0–15.0)
Lymphocytes Relative: 33.8 % (ref 12.0–46.0)
Lymphs Abs: 2.9 10*3/uL (ref 0.7–4.0)
MCHC: 32.9 g/dL (ref 30.0–36.0)
MCV: 88 fl (ref 78.0–100.0)
Monocytes Absolute: 0.5 10*3/uL (ref 0.1–1.0)
Monocytes Relative: 5.9 % (ref 3.0–12.0)
Neutro Abs: 4.8 10*3/uL (ref 1.4–7.7)
Neutrophils Relative %: 55.9 % (ref 43.0–77.0)
Platelets: 252 10*3/uL (ref 150.0–400.0)
RBC: 4.27 Mil/uL (ref 3.87–5.11)
RDW: 13.3 % (ref 11.5–15.5)
WBC: 8.5 10*3/uL (ref 4.0–10.5)

## 2019-12-24 LAB — LIPID PANEL
Cholesterol: 156 mg/dL (ref 0–200)
HDL: 53.4 mg/dL (ref >39.00)
LDL Cholesterol: 80 mg/dL (ref 0–99)
NonHDL: 102.68
Total CHOL/HDL Ratio: 3
Triglycerides: 113 mg/dL (ref 0.0–149.0)
VLDL: 22.6 mg/dL (ref 0.0–40.0)

## 2019-12-24 LAB — COMPREHENSIVE METABOLIC PANEL
ALT: 11 U/L (ref 0–35)
AST: 15 U/L (ref 0–37)
Albumin: 3.8 g/dL (ref 3.5–5.2)
Alkaline Phosphatase: 90 U/L (ref 39–117)
BUN: 17 mg/dL (ref 6–23)
CO2: 28 mEq/L (ref 19–32)
Calcium: 8.9 mg/dL (ref 8.4–10.5)
Chloride: 106 mEq/L (ref 96–112)
Creatinine, Ser: 0.66 mg/dL (ref 0.40–1.20)
GFR: 95.32 mL/min (ref >60.00)
Glucose, Bld: 91 mg/dL (ref 70–99)
Potassium: 4.1 mEq/L (ref 3.5–5.1)
Sodium: 141 mEq/L (ref 135–145)
Total Bilirubin: 0.4 mg/dL (ref 0.2–1.2)
Total Protein: 6.4 g/dL (ref 6.0–8.3)

## 2019-12-24 LAB — HEMOGLOBIN A1C: Hgb A1c MFr Bld: 5 % (ref 4.6–6.5)

## 2019-12-24 LAB — TSH: TSH: 1.15 u[IU]/mL (ref 0.35–4.50)

## 2019-12-24 LAB — B12 AND FOLATE PANEL
Folate: 16.3 ng/mL (ref >5.9)
Vitamin B-12: 337 pg/mL (ref 211–911)

## 2019-12-24 LAB — VITAMIN D 25 HYDROXY (VIT D DEFICIENCY, FRACTURES): VITD: 26.61 ng/mL — ABNORMAL LOW (ref 30.00–100.00)

## 2019-12-24 LAB — IBC + FERRITIN
Ferritin: 7.4 ng/mL — ABNORMAL LOW (ref 10.0–291.0)
Iron: 69 ug/dL (ref 42–145)
Saturation Ratios: 16.4 % — ABNORMAL LOW (ref 20.0–50.0)
Transferrin: 301 mg/dL (ref 212.0–360.0)

## 2019-12-24 NOTE — Telephone Encounter (Signed)
Call patient It appears that she had a biopsy with [facility].  I did receive mammogram report for suspicious mass in the left breast and calcifications in the right.    Please ensure that she has had a biopsy and let us know if any further questions or if she would like a second opinion with a breast surgeon in [place].

## 2019-12-24 NOTE — Telephone Encounter (Signed)
LMTCB

## 2019-12-25 ENCOUNTER — Other Ambulatory Visit: Payer: Self-pay | Admitting: Family

## 2019-12-25 DIAGNOSIS — E611 Iron deficiency: Secondary | ICD-10-CM

## 2019-12-25 MED ORDER — FERROUS SULFATE 325 (65 FE) MG PO TBEC: 325.0000 mg | DELAYED_RELEASE_TABLET | Freq: Three times a day (TID) | ORAL | 2 refills | Status: DC

## 2019-12-26 ENCOUNTER — Telehealth: Payer: Self-pay | Admitting: Family

## 2019-12-26 NOTE — Telephone Encounter (Signed)
Lm on vm. Checked visits 08/30/2019 and 9/16/200, no where in chart notes is it stated pt has covid. Office can not change providers notes to say covid in order for patients insurance to pay for virtual. Pt went to the hospital on 09/04/2019 where she was tested for covid with a positive result.

## 2019-12-27 DIAGNOSIS — R928 Other abnormal and inconclusive findings on diagnostic imaging of breast: Secondary | ICD-10-CM | POA: Diagnosis not present

## 2019-12-27 DIAGNOSIS — Z9884 Bariatric surgery status: Secondary | ICD-10-CM | POA: Diagnosis not present

## 2019-12-27 DIAGNOSIS — Z87891 Personal history of nicotine dependence: Secondary | ICD-10-CM | POA: Diagnosis not present

## 2019-12-27 DIAGNOSIS — R921 Mammographic calcification found on diagnostic imaging of breast: Secondary | ICD-10-CM | POA: Diagnosis not present

## 2019-12-27 DIAGNOSIS — G2581 Restless legs syndrome: Secondary | ICD-10-CM | POA: Diagnosis not present

## 2019-12-27 DIAGNOSIS — Z803 Family history of malignant neoplasm of breast: Secondary | ICD-10-CM | POA: Diagnosis not present

## 2019-12-27 DIAGNOSIS — N6322 Unspecified lump in the left breast, upper inner quadrant: Secondary | ICD-10-CM | POA: Diagnosis not present

## 2019-12-27 DIAGNOSIS — Z79899 Other long term (current) drug therapy: Secondary | ICD-10-CM | POA: Diagnosis not present

## 2019-12-27 DIAGNOSIS — Z6841 Body Mass Index (BMI) 40.0 and over, adult: Secondary | ICD-10-CM | POA: Diagnosis not present

## 2019-12-30 DIAGNOSIS — N6311 Unspecified lump in the right breast, upper outer quadrant: Secondary | ICD-10-CM | POA: Diagnosis not present

## 2019-12-30 DIAGNOSIS — Z888 Allergy status to other drugs, medicaments and biological substances status: Secondary | ICD-10-CM | POA: Diagnosis not present

## 2019-12-30 DIAGNOSIS — C50812 Malignant neoplasm of overlapping sites of left female breast: Secondary | ICD-10-CM | POA: Diagnosis not present

## 2019-12-30 DIAGNOSIS — Z87891 Personal history of nicotine dependence: Secondary | ICD-10-CM | POA: Diagnosis not present

## 2019-12-30 DIAGNOSIS — N6082 Other benign mammary dysplasias of left breast: Secondary | ICD-10-CM | POA: Diagnosis not present

## 2019-12-30 DIAGNOSIS — R928 Other abnormal and inconclusive findings on diagnostic imaging of breast: Secondary | ICD-10-CM | POA: Diagnosis not present

## 2019-12-30 DIAGNOSIS — Z170 Estrogen receptor positive status [ER+]: Secondary | ICD-10-CM | POA: Diagnosis not present

## 2019-12-30 DIAGNOSIS — N6091 Unspecified benign mammary dysplasia of right breast: Secondary | ICD-10-CM | POA: Diagnosis not present

## 2019-12-30 DIAGNOSIS — N6322 Unspecified lump in the left breast, upper inner quadrant: Secondary | ICD-10-CM | POA: Diagnosis not present

## 2019-12-30 NOTE — Telephone Encounter (Signed)
LM again asking patient to call back.

## 2019-12-30 NOTE — Telephone Encounter (Signed)
Circle back please

## 2020-01-03 NOTE — Telephone Encounter (Signed)
Mail letter. 

## 2020-01-03 NOTE — Telephone Encounter (Signed)
Letter sent to [facility] & mailed.

## 2020-01-07 DIAGNOSIS — Z170 Estrogen receptor positive status [ER+]: Secondary | ICD-10-CM | POA: Diagnosis not present

## 2020-01-07 DIAGNOSIS — Z9884 Bariatric surgery status: Secondary | ICD-10-CM | POA: Diagnosis not present

## 2020-01-07 DIAGNOSIS — Z803 Family history of malignant neoplasm of breast: Secondary | ICD-10-CM | POA: Diagnosis not present

## 2020-01-07 DIAGNOSIS — C50912 Malignant neoplasm of unspecified site of left female breast: Secondary | ICD-10-CM | POA: Diagnosis not present

## 2020-01-07 DIAGNOSIS — Z6841 Body Mass Index (BMI) 40.0 and over, adult: Secondary | ICD-10-CM | POA: Diagnosis not present

## 2020-01-07 DIAGNOSIS — N6091 Unspecified benign mammary dysplasia of right breast: Secondary | ICD-10-CM | POA: Diagnosis not present

## 2020-01-07 DIAGNOSIS — C50812 Malignant neoplasm of overlapping sites of left female breast: Secondary | ICD-10-CM | POA: Diagnosis not present

## 2020-01-07 DIAGNOSIS — Z87891 Personal history of nicotine dependence: Secondary | ICD-10-CM | POA: Diagnosis not present

## 2020-01-07 DIAGNOSIS — N6092 Unspecified benign mammary dysplasia of left breast: Secondary | ICD-10-CM | POA: Diagnosis not present

## 2020-01-08 DIAGNOSIS — Z803 Family history of malignant neoplasm of breast: Secondary | ICD-10-CM | POA: Diagnosis not present

## 2020-01-08 DIAGNOSIS — C50912 Malignant neoplasm of unspecified site of left female breast: Secondary | ICD-10-CM | POA: Diagnosis not present

## 2020-01-22 DIAGNOSIS — R2243 Localized swelling, mass and lump, lower limb, bilateral: Secondary | ICD-10-CM | POA: Diagnosis not present

## 2020-01-22 DIAGNOSIS — E669 Obesity, unspecified: Secondary | ICD-10-CM | POA: Diagnosis not present

## 2020-01-22 DIAGNOSIS — Z20822 Contact with and (suspected) exposure to covid-19: Secondary | ICD-10-CM | POA: Diagnosis not present

## 2020-01-22 DIAGNOSIS — I10 Essential (primary) hypertension: Secondary | ICD-10-CM | POA: Diagnosis not present

## 2020-01-22 DIAGNOSIS — G4733 Obstructive sleep apnea (adult) (pediatric): Secondary | ICD-10-CM | POA: Diagnosis not present

## 2020-01-22 DIAGNOSIS — Z01812 Encounter for preprocedural laboratory examination: Secondary | ICD-10-CM | POA: Diagnosis not present

## 2020-01-22 DIAGNOSIS — Z01818 Encounter for other preprocedural examination: Secondary | ICD-10-CM | POA: Diagnosis not present

## 2020-01-23 DIAGNOSIS — C50912 Malignant neoplasm of unspecified site of left female breast: Secondary | ICD-10-CM | POA: Diagnosis not present

## 2020-01-24 DIAGNOSIS — N62 Hypertrophy of breast: Secondary | ICD-10-CM | POA: Diagnosis not present

## 2020-01-24 DIAGNOSIS — Z803 Family history of malignant neoplasm of breast: Secondary | ICD-10-CM | POA: Diagnosis not present

## 2020-01-24 DIAGNOSIS — Z87891 Personal history of nicotine dependence: Secondary | ICD-10-CM | POA: Diagnosis not present

## 2020-01-24 DIAGNOSIS — D0502 Lobular carcinoma in situ of left breast: Secondary | ICD-10-CM | POA: Diagnosis not present

## 2020-01-24 DIAGNOSIS — C50912 Malignant neoplasm of unspecified site of left female breast: Secondary | ICD-10-CM | POA: Diagnosis not present

## 2020-01-24 DIAGNOSIS — Z9884 Bariatric surgery status: Secondary | ICD-10-CM | POA: Diagnosis not present

## 2020-01-24 DIAGNOSIS — E669 Obesity, unspecified: Secondary | ICD-10-CM | POA: Diagnosis not present

## 2020-01-24 DIAGNOSIS — G4733 Obstructive sleep apnea (adult) (pediatric): Secondary | ICD-10-CM | POA: Diagnosis not present

## 2020-02-04 DIAGNOSIS — C50912 Malignant neoplasm of unspecified site of left female breast: Secondary | ICD-10-CM | POA: Diagnosis not present

## 2020-02-04 DIAGNOSIS — Z9012 Acquired absence of left breast and nipple: Secondary | ICD-10-CM | POA: Diagnosis not present

## 2020-02-04 DIAGNOSIS — Z01812 Encounter for preprocedural laboratory examination: Secondary | ICD-10-CM | POA: Diagnosis not present

## 2020-02-04 DIAGNOSIS — Z20822 Contact with and (suspected) exposure to covid-19: Secondary | ICD-10-CM | POA: Diagnosis not present

## 2020-02-05 ENCOUNTER — Encounter: Payer: Self-pay | Admitting: Family

## 2020-02-06 ENCOUNTER — Other Ambulatory Visit: Payer: Self-pay

## 2020-02-06 DIAGNOSIS — Z87891 Personal history of nicotine dependence: Secondary | ICD-10-CM | POA: Diagnosis not present

## 2020-02-06 DIAGNOSIS — C50912 Malignant neoplasm of unspecified site of left female breast: Secondary | ICD-10-CM | POA: Diagnosis not present

## 2020-02-06 DIAGNOSIS — Z170 Estrogen receptor positive status [ER+]: Secondary | ICD-10-CM | POA: Diagnosis not present

## 2020-02-06 DIAGNOSIS — Z9884 Bariatric surgery status: Secondary | ICD-10-CM | POA: Diagnosis not present

## 2020-02-06 DIAGNOSIS — G4733 Obstructive sleep apnea (adult) (pediatric): Secondary | ICD-10-CM | POA: Diagnosis not present

## 2020-02-06 DIAGNOSIS — E669 Obesity, unspecified: Secondary | ICD-10-CM | POA: Diagnosis not present

## 2020-02-06 DIAGNOSIS — R928 Other abnormal and inconclusive findings on diagnostic imaging of breast: Secondary | ICD-10-CM | POA: Diagnosis not present

## 2020-02-06 DIAGNOSIS — Z6841 Body Mass Index (BMI) 40.0 and over, adult: Secondary | ICD-10-CM | POA: Diagnosis not present

## 2020-02-06 MED ORDER — HYDROCHLOROTHIAZIDE 25 MG PO TABS: 25.0000 mg | ORAL_TABLET | Freq: Every day | ORAL | 3 refills | Status: DC

## 2020-02-18 DIAGNOSIS — Z08 Encounter for follow-up examination after completed treatment for malignant neoplasm: Secondary | ICD-10-CM | POA: Diagnosis not present

## 2020-02-18 DIAGNOSIS — Z170 Estrogen receptor positive status [ER+]: Secondary | ICD-10-CM | POA: Diagnosis not present

## 2020-02-18 DIAGNOSIS — Z9010 Acquired absence of unspecified breast and nipple: Secondary | ICD-10-CM | POA: Diagnosis not present

## 2020-02-18 DIAGNOSIS — C50912 Malignant neoplasm of unspecified site of left female breast: Secondary | ICD-10-CM | POA: Diagnosis not present

## 2020-02-20 DIAGNOSIS — C50912 Malignant neoplasm of unspecified site of left female breast: Secondary | ICD-10-CM | POA: Diagnosis not present

## 2020-02-20 DIAGNOSIS — C50812 Malignant neoplasm of overlapping sites of left female breast: Secondary | ICD-10-CM | POA: Insufficient documentation

## 2020-02-20 DIAGNOSIS — Z170 Estrogen receptor positive status [ER+]: Secondary | ICD-10-CM | POA: Diagnosis not present

## 2020-02-24 DIAGNOSIS — K912 Postsurgical malabsorption, not elsewhere classified: Secondary | ICD-10-CM | POA: Diagnosis not present

## 2020-02-24 DIAGNOSIS — Z9884 Bariatric surgery status: Secondary | ICD-10-CM | POA: Diagnosis not present

## 2020-02-24 DIAGNOSIS — R635 Abnormal weight gain: Secondary | ICD-10-CM | POA: Diagnosis not present

## 2020-02-24 DIAGNOSIS — Z713 Dietary counseling and surveillance: Secondary | ICD-10-CM | POA: Diagnosis not present

## 2020-03-07 DIAGNOSIS — C50912 Malignant neoplasm of unspecified site of left female breast: Secondary | ICD-10-CM | POA: Diagnosis not present

## 2020-03-07 DIAGNOSIS — Z170 Estrogen receptor positive status [ER+]: Secondary | ICD-10-CM | POA: Diagnosis not present

## 2020-03-10 DIAGNOSIS — Z170 Estrogen receptor positive status [ER+]: Secondary | ICD-10-CM | POA: Diagnosis not present

## 2020-03-10 DIAGNOSIS — C50912 Malignant neoplasm of unspecified site of left female breast: Secondary | ICD-10-CM | POA: Diagnosis not present

## 2020-03-20 DIAGNOSIS — N6091 Unspecified benign mammary dysplasia of right breast: Secondary | ICD-10-CM | POA: Diagnosis not present

## 2020-03-20 DIAGNOSIS — Z79899 Other long term (current) drug therapy: Secondary | ICD-10-CM | POA: Diagnosis not present

## 2020-03-20 DIAGNOSIS — C50912 Malignant neoplasm of unspecified site of left female breast: Secondary | ICD-10-CM | POA: Diagnosis not present

## 2020-03-20 DIAGNOSIS — Z9884 Bariatric surgery status: Secondary | ICD-10-CM | POA: Diagnosis not present

## 2020-03-20 DIAGNOSIS — N6092 Unspecified benign mammary dysplasia of left breast: Secondary | ICD-10-CM | POA: Diagnosis not present

## 2020-03-20 DIAGNOSIS — C50812 Malignant neoplasm of overlapping sites of left female breast: Secondary | ICD-10-CM | POA: Diagnosis not present

## 2020-03-20 DIAGNOSIS — Z87891 Personal history of nicotine dependence: Secondary | ICD-10-CM | POA: Diagnosis not present

## 2020-03-20 DIAGNOSIS — Z170 Estrogen receptor positive status [ER+]: Secondary | ICD-10-CM | POA: Diagnosis not present

## 2020-03-20 DIAGNOSIS — K912 Postsurgical malabsorption, not elsewhere classified: Secondary | ICD-10-CM | POA: Diagnosis not present

## 2020-03-20 DIAGNOSIS — Z803 Family history of malignant neoplasm of breast: Secondary | ICD-10-CM | POA: Diagnosis not present

## 2020-03-25 DIAGNOSIS — Z48815 Encounter for surgical aftercare following surgery on the digestive system: Secondary | ICD-10-CM | POA: Diagnosis not present

## 2020-03-25 DIAGNOSIS — Z9884 Bariatric surgery status: Secondary | ICD-10-CM | POA: Diagnosis not present

## 2020-03-25 DIAGNOSIS — Z713 Dietary counseling and surveillance: Secondary | ICD-10-CM | POA: Diagnosis not present

## 2020-03-25 DIAGNOSIS — K912 Postsurgical malabsorption, not elsewhere classified: Secondary | ICD-10-CM | POA: Diagnosis not present

## 2020-03-30 DIAGNOSIS — C50912 Malignant neoplasm of unspecified site of left female breast: Secondary | ICD-10-CM | POA: Diagnosis not present

## 2020-03-30 DIAGNOSIS — Z87891 Personal history of nicotine dependence: Secondary | ICD-10-CM | POA: Diagnosis not present

## 2020-03-30 DIAGNOSIS — C50812 Malignant neoplasm of overlapping sites of left female breast: Secondary | ICD-10-CM | POA: Diagnosis not present

## 2020-03-30 DIAGNOSIS — Z9884 Bariatric surgery status: Secondary | ICD-10-CM | POA: Diagnosis not present

## 2020-03-30 DIAGNOSIS — Z170 Estrogen receptor positive status [ER+]: Secondary | ICD-10-CM | POA: Diagnosis not present

## 2020-03-30 DIAGNOSIS — N6091 Unspecified benign mammary dysplasia of right breast: Secondary | ICD-10-CM | POA: Diagnosis not present

## 2020-03-30 DIAGNOSIS — N6092 Unspecified benign mammary dysplasia of left breast: Secondary | ICD-10-CM | POA: Diagnosis not present

## 2020-03-30 DIAGNOSIS — Z803 Family history of malignant neoplasm of breast: Secondary | ICD-10-CM | POA: Diagnosis not present

## 2020-03-31 DIAGNOSIS — C50912 Malignant neoplasm of unspecified site of left female breast: Secondary | ICD-10-CM | POA: Diagnosis not present

## 2020-03-31 DIAGNOSIS — Z170 Estrogen receptor positive status [ER+]: Secondary | ICD-10-CM | POA: Diagnosis not present

## 2020-03-31 DIAGNOSIS — Z803 Family history of malignant neoplasm of breast: Secondary | ICD-10-CM | POA: Diagnosis not present

## 2020-03-31 DIAGNOSIS — C50812 Malignant neoplasm of overlapping sites of left female breast: Secondary | ICD-10-CM | POA: Diagnosis not present

## 2020-03-31 DIAGNOSIS — Z87891 Personal history of nicotine dependence: Secondary | ICD-10-CM | POA: Diagnosis not present

## 2020-03-31 DIAGNOSIS — N6092 Unspecified benign mammary dysplasia of left breast: Secondary | ICD-10-CM | POA: Diagnosis not present

## 2020-03-31 DIAGNOSIS — Z9884 Bariatric surgery status: Secondary | ICD-10-CM | POA: Diagnosis not present

## 2020-03-31 DIAGNOSIS — N6091 Unspecified benign mammary dysplasia of right breast: Secondary | ICD-10-CM | POA: Diagnosis not present

## 2020-04-01 DIAGNOSIS — N6091 Unspecified benign mammary dysplasia of right breast: Secondary | ICD-10-CM | POA: Diagnosis not present

## 2020-04-01 DIAGNOSIS — Z170 Estrogen receptor positive status [ER+]: Secondary | ICD-10-CM | POA: Diagnosis not present

## 2020-04-01 DIAGNOSIS — N6092 Unspecified benign mammary dysplasia of left breast: Secondary | ICD-10-CM | POA: Diagnosis not present

## 2020-04-01 DIAGNOSIS — Z87891 Personal history of nicotine dependence: Secondary | ICD-10-CM | POA: Diagnosis not present

## 2020-04-01 DIAGNOSIS — Z9884 Bariatric surgery status: Secondary | ICD-10-CM | POA: Diagnosis not present

## 2020-04-01 DIAGNOSIS — Z803 Family history of malignant neoplasm of breast: Secondary | ICD-10-CM | POA: Diagnosis not present

## 2020-04-01 DIAGNOSIS — C50912 Malignant neoplasm of unspecified site of left female breast: Secondary | ICD-10-CM | POA: Diagnosis not present

## 2020-04-01 DIAGNOSIS — C50812 Malignant neoplasm of overlapping sites of left female breast: Secondary | ICD-10-CM | POA: Diagnosis not present

## 2020-04-02 DIAGNOSIS — C50912 Malignant neoplasm of unspecified site of left female breast: Secondary | ICD-10-CM | POA: Diagnosis not present

## 2020-04-02 DIAGNOSIS — Z170 Estrogen receptor positive status [ER+]: Secondary | ICD-10-CM | POA: Diagnosis not present

## 2020-04-02 DIAGNOSIS — N6092 Unspecified benign mammary dysplasia of left breast: Secondary | ICD-10-CM | POA: Diagnosis not present

## 2020-04-02 DIAGNOSIS — Z87891 Personal history of nicotine dependence: Secondary | ICD-10-CM | POA: Diagnosis not present

## 2020-04-02 DIAGNOSIS — Z803 Family history of malignant neoplasm of breast: Secondary | ICD-10-CM | POA: Diagnosis not present

## 2020-04-02 DIAGNOSIS — C50812 Malignant neoplasm of overlapping sites of left female breast: Secondary | ICD-10-CM | POA: Diagnosis not present

## 2020-04-02 DIAGNOSIS — Z9884 Bariatric surgery status: Secondary | ICD-10-CM | POA: Diagnosis not present

## 2020-04-02 DIAGNOSIS — N6091 Unspecified benign mammary dysplasia of right breast: Secondary | ICD-10-CM | POA: Diagnosis not present

## 2020-04-03 DIAGNOSIS — Z87891 Personal history of nicotine dependence: Secondary | ICD-10-CM | POA: Diagnosis not present

## 2020-04-03 DIAGNOSIS — C50912 Malignant neoplasm of unspecified site of left female breast: Secondary | ICD-10-CM | POA: Diagnosis not present

## 2020-04-03 DIAGNOSIS — Z170 Estrogen receptor positive status [ER+]: Secondary | ICD-10-CM | POA: Diagnosis not present

## 2020-04-03 DIAGNOSIS — Z9884 Bariatric surgery status: Secondary | ICD-10-CM | POA: Diagnosis not present

## 2020-04-03 DIAGNOSIS — Z803 Family history of malignant neoplasm of breast: Secondary | ICD-10-CM | POA: Diagnosis not present

## 2020-04-03 DIAGNOSIS — C50812 Malignant neoplasm of overlapping sites of left female breast: Secondary | ICD-10-CM | POA: Diagnosis not present

## 2020-04-03 DIAGNOSIS — N6091 Unspecified benign mammary dysplasia of right breast: Secondary | ICD-10-CM | POA: Diagnosis not present

## 2020-04-03 DIAGNOSIS — N6092 Unspecified benign mammary dysplasia of left breast: Secondary | ICD-10-CM | POA: Diagnosis not present

## 2020-04-06 DIAGNOSIS — C50912 Malignant neoplasm of unspecified site of left female breast: Secondary | ICD-10-CM | POA: Diagnosis not present

## 2020-04-06 DIAGNOSIS — Z803 Family history of malignant neoplasm of breast: Secondary | ICD-10-CM | POA: Diagnosis not present

## 2020-04-06 DIAGNOSIS — N6092 Unspecified benign mammary dysplasia of left breast: Secondary | ICD-10-CM | POA: Diagnosis not present

## 2020-04-06 DIAGNOSIS — Z87891 Personal history of nicotine dependence: Secondary | ICD-10-CM | POA: Diagnosis not present

## 2020-04-06 DIAGNOSIS — N6091 Unspecified benign mammary dysplasia of right breast: Secondary | ICD-10-CM | POA: Diagnosis not present

## 2020-04-06 DIAGNOSIS — C50812 Malignant neoplasm of overlapping sites of left female breast: Secondary | ICD-10-CM | POA: Diagnosis not present

## 2020-04-06 DIAGNOSIS — Z9884 Bariatric surgery status: Secondary | ICD-10-CM | POA: Diagnosis not present

## 2020-04-06 DIAGNOSIS — Z170 Estrogen receptor positive status [ER+]: Secondary | ICD-10-CM | POA: Diagnosis not present

## 2020-04-07 DIAGNOSIS — C50912 Malignant neoplasm of unspecified site of left female breast: Secondary | ICD-10-CM | POA: Diagnosis not present

## 2020-04-07 DIAGNOSIS — Z170 Estrogen receptor positive status [ER+]: Secondary | ICD-10-CM | POA: Diagnosis not present

## 2020-04-07 DIAGNOSIS — Z9884 Bariatric surgery status: Secondary | ICD-10-CM | POA: Diagnosis not present

## 2020-04-07 DIAGNOSIS — Z87891 Personal history of nicotine dependence: Secondary | ICD-10-CM | POA: Diagnosis not present

## 2020-04-07 DIAGNOSIS — Z803 Family history of malignant neoplasm of breast: Secondary | ICD-10-CM | POA: Diagnosis not present

## 2020-04-07 DIAGNOSIS — N6091 Unspecified benign mammary dysplasia of right breast: Secondary | ICD-10-CM | POA: Diagnosis not present

## 2020-04-07 DIAGNOSIS — N6092 Unspecified benign mammary dysplasia of left breast: Secondary | ICD-10-CM | POA: Diagnosis not present

## 2020-04-07 DIAGNOSIS — C50812 Malignant neoplasm of overlapping sites of left female breast: Secondary | ICD-10-CM | POA: Diagnosis not present

## 2020-04-08 DIAGNOSIS — C50912 Malignant neoplasm of unspecified site of left female breast: Secondary | ICD-10-CM | POA: Diagnosis not present

## 2020-04-08 DIAGNOSIS — Z9884 Bariatric surgery status: Secondary | ICD-10-CM | POA: Diagnosis not present

## 2020-04-08 DIAGNOSIS — Z170 Estrogen receptor positive status [ER+]: Secondary | ICD-10-CM | POA: Diagnosis not present

## 2020-04-08 DIAGNOSIS — Z803 Family history of malignant neoplasm of breast: Secondary | ICD-10-CM | POA: Diagnosis not present

## 2020-04-08 DIAGNOSIS — N6092 Unspecified benign mammary dysplasia of left breast: Secondary | ICD-10-CM | POA: Diagnosis not present

## 2020-04-08 DIAGNOSIS — C50812 Malignant neoplasm of overlapping sites of left female breast: Secondary | ICD-10-CM | POA: Diagnosis not present

## 2020-04-08 DIAGNOSIS — Z87891 Personal history of nicotine dependence: Secondary | ICD-10-CM | POA: Diagnosis not present

## 2020-04-08 DIAGNOSIS — N6091 Unspecified benign mammary dysplasia of right breast: Secondary | ICD-10-CM | POA: Diagnosis not present

## 2020-04-09 DIAGNOSIS — N6091 Unspecified benign mammary dysplasia of right breast: Secondary | ICD-10-CM | POA: Diagnosis not present

## 2020-04-09 DIAGNOSIS — C50812 Malignant neoplasm of overlapping sites of left female breast: Secondary | ICD-10-CM | POA: Diagnosis not present

## 2020-04-09 DIAGNOSIS — C50912 Malignant neoplasm of unspecified site of left female breast: Secondary | ICD-10-CM | POA: Diagnosis not present

## 2020-04-09 DIAGNOSIS — Z803 Family history of malignant neoplasm of breast: Secondary | ICD-10-CM | POA: Diagnosis not present

## 2020-04-09 DIAGNOSIS — Z170 Estrogen receptor positive status [ER+]: Secondary | ICD-10-CM | POA: Diagnosis not present

## 2020-04-09 DIAGNOSIS — Z87891 Personal history of nicotine dependence: Secondary | ICD-10-CM | POA: Diagnosis not present

## 2020-04-09 DIAGNOSIS — Z9884 Bariatric surgery status: Secondary | ICD-10-CM | POA: Diagnosis not present

## 2020-04-09 DIAGNOSIS — N6092 Unspecified benign mammary dysplasia of left breast: Secondary | ICD-10-CM | POA: Diagnosis not present

## 2020-04-10 DIAGNOSIS — N6091 Unspecified benign mammary dysplasia of right breast: Secondary | ICD-10-CM | POA: Diagnosis not present

## 2020-04-10 DIAGNOSIS — Z87891 Personal history of nicotine dependence: Secondary | ICD-10-CM | POA: Diagnosis not present

## 2020-04-10 DIAGNOSIS — N6092 Unspecified benign mammary dysplasia of left breast: Secondary | ICD-10-CM | POA: Diagnosis not present

## 2020-04-10 DIAGNOSIS — Z803 Family history of malignant neoplasm of breast: Secondary | ICD-10-CM | POA: Diagnosis not present

## 2020-04-10 DIAGNOSIS — C50812 Malignant neoplasm of overlapping sites of left female breast: Secondary | ICD-10-CM | POA: Diagnosis not present

## 2020-04-10 DIAGNOSIS — C50912 Malignant neoplasm of unspecified site of left female breast: Secondary | ICD-10-CM | POA: Diagnosis not present

## 2020-04-10 DIAGNOSIS — Z9884 Bariatric surgery status: Secondary | ICD-10-CM | POA: Diagnosis not present

## 2020-04-10 DIAGNOSIS — Z170 Estrogen receptor positive status [ER+]: Secondary | ICD-10-CM | POA: Diagnosis not present

## 2020-04-13 DIAGNOSIS — Z9884 Bariatric surgery status: Secondary | ICD-10-CM | POA: Diagnosis not present

## 2020-04-13 DIAGNOSIS — Z170 Estrogen receptor positive status [ER+]: Secondary | ICD-10-CM | POA: Diagnosis not present

## 2020-04-13 DIAGNOSIS — C50912 Malignant neoplasm of unspecified site of left female breast: Secondary | ICD-10-CM | POA: Diagnosis not present

## 2020-04-13 DIAGNOSIS — Z803 Family history of malignant neoplasm of breast: Secondary | ICD-10-CM | POA: Diagnosis not present

## 2020-04-13 DIAGNOSIS — Z87891 Personal history of nicotine dependence: Secondary | ICD-10-CM | POA: Diagnosis not present

## 2020-04-13 DIAGNOSIS — N6091 Unspecified benign mammary dysplasia of right breast: Secondary | ICD-10-CM | POA: Diagnosis not present

## 2020-04-13 DIAGNOSIS — N6092 Unspecified benign mammary dysplasia of left breast: Secondary | ICD-10-CM | POA: Diagnosis not present

## 2020-04-13 DIAGNOSIS — C50812 Malignant neoplasm of overlapping sites of left female breast: Secondary | ICD-10-CM | POA: Diagnosis not present

## 2020-04-14 DIAGNOSIS — Z803 Family history of malignant neoplasm of breast: Secondary | ICD-10-CM | POA: Diagnosis not present

## 2020-04-14 DIAGNOSIS — N6091 Unspecified benign mammary dysplasia of right breast: Secondary | ICD-10-CM | POA: Diagnosis not present

## 2020-04-14 DIAGNOSIS — C50812 Malignant neoplasm of overlapping sites of left female breast: Secondary | ICD-10-CM | POA: Diagnosis not present

## 2020-04-14 DIAGNOSIS — Z9884 Bariatric surgery status: Secondary | ICD-10-CM | POA: Diagnosis not present

## 2020-04-14 DIAGNOSIS — C50912 Malignant neoplasm of unspecified site of left female breast: Secondary | ICD-10-CM | POA: Diagnosis not present

## 2020-04-14 DIAGNOSIS — Z87891 Personal history of nicotine dependence: Secondary | ICD-10-CM | POA: Diagnosis not present

## 2020-04-14 DIAGNOSIS — N6092 Unspecified benign mammary dysplasia of left breast: Secondary | ICD-10-CM | POA: Diagnosis not present

## 2020-04-14 DIAGNOSIS — Z170 Estrogen receptor positive status [ER+]: Secondary | ICD-10-CM | POA: Diagnosis not present

## 2020-04-15 DIAGNOSIS — C50912 Malignant neoplasm of unspecified site of left female breast: Secondary | ICD-10-CM | POA: Diagnosis not present

## 2020-04-15 DIAGNOSIS — Z803 Family history of malignant neoplasm of breast: Secondary | ICD-10-CM | POA: Diagnosis not present

## 2020-04-15 DIAGNOSIS — N6091 Unspecified benign mammary dysplasia of right breast: Secondary | ICD-10-CM | POA: Diagnosis not present

## 2020-04-15 DIAGNOSIS — N6092 Unspecified benign mammary dysplasia of left breast: Secondary | ICD-10-CM | POA: Diagnosis not present

## 2020-04-15 DIAGNOSIS — Z87891 Personal history of nicotine dependence: Secondary | ICD-10-CM | POA: Diagnosis not present

## 2020-04-15 DIAGNOSIS — Z9884 Bariatric surgery status: Secondary | ICD-10-CM | POA: Diagnosis not present

## 2020-04-15 DIAGNOSIS — Z170 Estrogen receptor positive status [ER+]: Secondary | ICD-10-CM | POA: Diagnosis not present

## 2020-04-15 DIAGNOSIS — C50812 Malignant neoplasm of overlapping sites of left female breast: Secondary | ICD-10-CM | POA: Diagnosis not present

## 2020-04-16 DIAGNOSIS — N6092 Unspecified benign mammary dysplasia of left breast: Secondary | ICD-10-CM | POA: Diagnosis not present

## 2020-04-16 DIAGNOSIS — Z87891 Personal history of nicotine dependence: Secondary | ICD-10-CM | POA: Diagnosis not present

## 2020-04-16 DIAGNOSIS — C50812 Malignant neoplasm of overlapping sites of left female breast: Secondary | ICD-10-CM | POA: Diagnosis not present

## 2020-04-16 DIAGNOSIS — Z803 Family history of malignant neoplasm of breast: Secondary | ICD-10-CM | POA: Diagnosis not present

## 2020-04-16 DIAGNOSIS — Z9884 Bariatric surgery status: Secondary | ICD-10-CM | POA: Diagnosis not present

## 2020-04-16 DIAGNOSIS — N6091 Unspecified benign mammary dysplasia of right breast: Secondary | ICD-10-CM | POA: Diagnosis not present

## 2020-04-16 DIAGNOSIS — C50912 Malignant neoplasm of unspecified site of left female breast: Secondary | ICD-10-CM | POA: Diagnosis not present

## 2020-04-16 DIAGNOSIS — Z170 Estrogen receptor positive status [ER+]: Secondary | ICD-10-CM | POA: Diagnosis not present

## 2020-04-17 DIAGNOSIS — Z170 Estrogen receptor positive status [ER+]: Secondary | ICD-10-CM | POA: Diagnosis not present

## 2020-04-17 DIAGNOSIS — Z9884 Bariatric surgery status: Secondary | ICD-10-CM | POA: Diagnosis not present

## 2020-04-17 DIAGNOSIS — Z87891 Personal history of nicotine dependence: Secondary | ICD-10-CM | POA: Diagnosis not present

## 2020-04-17 DIAGNOSIS — Z803 Family history of malignant neoplasm of breast: Secondary | ICD-10-CM | POA: Diagnosis not present

## 2020-04-17 DIAGNOSIS — C50812 Malignant neoplasm of overlapping sites of left female breast: Secondary | ICD-10-CM | POA: Diagnosis not present

## 2020-04-17 DIAGNOSIS — N6092 Unspecified benign mammary dysplasia of left breast: Secondary | ICD-10-CM | POA: Diagnosis not present

## 2020-04-17 DIAGNOSIS — C50912 Malignant neoplasm of unspecified site of left female breast: Secondary | ICD-10-CM | POA: Diagnosis not present

## 2020-04-17 DIAGNOSIS — N6091 Unspecified benign mammary dysplasia of right breast: Secondary | ICD-10-CM | POA: Diagnosis not present

## 2020-04-20 DIAGNOSIS — N6091 Unspecified benign mammary dysplasia of right breast: Secondary | ICD-10-CM | POA: Diagnosis not present

## 2020-04-20 DIAGNOSIS — Z170 Estrogen receptor positive status [ER+]: Secondary | ICD-10-CM | POA: Diagnosis not present

## 2020-04-20 DIAGNOSIS — Z9884 Bariatric surgery status: Secondary | ICD-10-CM | POA: Diagnosis not present

## 2020-04-20 DIAGNOSIS — C50812 Malignant neoplasm of overlapping sites of left female breast: Secondary | ICD-10-CM | POA: Diagnosis not present

## 2020-04-20 DIAGNOSIS — Z87891 Personal history of nicotine dependence: Secondary | ICD-10-CM | POA: Diagnosis not present

## 2020-04-20 DIAGNOSIS — C50912 Malignant neoplasm of unspecified site of left female breast: Secondary | ICD-10-CM | POA: Diagnosis not present

## 2020-04-20 DIAGNOSIS — N6092 Unspecified benign mammary dysplasia of left breast: Secondary | ICD-10-CM | POA: Diagnosis not present

## 2020-04-20 DIAGNOSIS — Z803 Family history of malignant neoplasm of breast: Secondary | ICD-10-CM | POA: Diagnosis not present

## 2020-04-21 DIAGNOSIS — C50812 Malignant neoplasm of overlapping sites of left female breast: Secondary | ICD-10-CM | POA: Diagnosis not present

## 2020-04-21 DIAGNOSIS — N6092 Unspecified benign mammary dysplasia of left breast: Secondary | ICD-10-CM | POA: Diagnosis not present

## 2020-04-21 DIAGNOSIS — N6091 Unspecified benign mammary dysplasia of right breast: Secondary | ICD-10-CM | POA: Diagnosis not present

## 2020-04-21 DIAGNOSIS — Z170 Estrogen receptor positive status [ER+]: Secondary | ICD-10-CM | POA: Diagnosis not present

## 2020-04-21 DIAGNOSIS — Z803 Family history of malignant neoplasm of breast: Secondary | ICD-10-CM | POA: Diagnosis not present

## 2020-04-21 DIAGNOSIS — Z9884 Bariatric surgery status: Secondary | ICD-10-CM | POA: Diagnosis not present

## 2020-04-21 DIAGNOSIS — Z87891 Personal history of nicotine dependence: Secondary | ICD-10-CM | POA: Diagnosis not present

## 2020-04-21 DIAGNOSIS — C50912 Malignant neoplasm of unspecified site of left female breast: Secondary | ICD-10-CM | POA: Diagnosis not present

## 2020-04-22 DIAGNOSIS — C50812 Malignant neoplasm of overlapping sites of left female breast: Secondary | ICD-10-CM | POA: Diagnosis not present

## 2020-04-22 DIAGNOSIS — Z87891 Personal history of nicotine dependence: Secondary | ICD-10-CM | POA: Diagnosis not present

## 2020-04-22 DIAGNOSIS — Z9884 Bariatric surgery status: Secondary | ICD-10-CM | POA: Diagnosis not present

## 2020-04-22 DIAGNOSIS — N6092 Unspecified benign mammary dysplasia of left breast: Secondary | ICD-10-CM | POA: Diagnosis not present

## 2020-04-22 DIAGNOSIS — Z803 Family history of malignant neoplasm of breast: Secondary | ICD-10-CM | POA: Diagnosis not present

## 2020-04-22 DIAGNOSIS — C50912 Malignant neoplasm of unspecified site of left female breast: Secondary | ICD-10-CM | POA: Diagnosis not present

## 2020-04-22 DIAGNOSIS — N6091 Unspecified benign mammary dysplasia of right breast: Secondary | ICD-10-CM | POA: Diagnosis not present

## 2020-04-22 DIAGNOSIS — Z170 Estrogen receptor positive status [ER+]: Secondary | ICD-10-CM | POA: Diagnosis not present

## 2020-04-23 DIAGNOSIS — Z9884 Bariatric surgery status: Secondary | ICD-10-CM | POA: Diagnosis not present

## 2020-04-23 DIAGNOSIS — N6092 Unspecified benign mammary dysplasia of left breast: Secondary | ICD-10-CM | POA: Diagnosis not present

## 2020-04-23 DIAGNOSIS — Z713 Dietary counseling and surveillance: Secondary | ICD-10-CM | POA: Diagnosis not present

## 2020-04-23 DIAGNOSIS — Z803 Family history of malignant neoplasm of breast: Secondary | ICD-10-CM | POA: Diagnosis not present

## 2020-04-23 DIAGNOSIS — Z170 Estrogen receptor positive status [ER+]: Secondary | ICD-10-CM | POA: Diagnosis not present

## 2020-04-23 DIAGNOSIS — Z6841 Body Mass Index (BMI) 40.0 and over, adult: Secondary | ICD-10-CM | POA: Diagnosis not present

## 2020-04-23 DIAGNOSIS — C50812 Malignant neoplasm of overlapping sites of left female breast: Secondary | ICD-10-CM | POA: Diagnosis not present

## 2020-04-23 DIAGNOSIS — Z87891 Personal history of nicotine dependence: Secondary | ICD-10-CM | POA: Diagnosis not present

## 2020-04-23 DIAGNOSIS — C50912 Malignant neoplasm of unspecified site of left female breast: Secondary | ICD-10-CM | POA: Diagnosis not present

## 2020-04-23 DIAGNOSIS — N6091 Unspecified benign mammary dysplasia of right breast: Secondary | ICD-10-CM | POA: Diagnosis not present

## 2020-04-24 DIAGNOSIS — C50812 Malignant neoplasm of overlapping sites of left female breast: Secondary | ICD-10-CM | POA: Diagnosis not present

## 2020-04-24 DIAGNOSIS — Z170 Estrogen receptor positive status [ER+]: Secondary | ICD-10-CM | POA: Diagnosis not present

## 2020-04-24 DIAGNOSIS — N6092 Unspecified benign mammary dysplasia of left breast: Secondary | ICD-10-CM | POA: Diagnosis not present

## 2020-04-24 DIAGNOSIS — Z9884 Bariatric surgery status: Secondary | ICD-10-CM | POA: Diagnosis not present

## 2020-04-24 DIAGNOSIS — Z803 Family history of malignant neoplasm of breast: Secondary | ICD-10-CM | POA: Diagnosis not present

## 2020-04-24 DIAGNOSIS — C50912 Malignant neoplasm of unspecified site of left female breast: Secondary | ICD-10-CM | POA: Diagnosis not present

## 2020-04-24 DIAGNOSIS — Z87891 Personal history of nicotine dependence: Secondary | ICD-10-CM | POA: Diagnosis not present

## 2020-04-24 DIAGNOSIS — N6091 Unspecified benign mammary dysplasia of right breast: Secondary | ICD-10-CM | POA: Diagnosis not present

## 2020-04-27 DIAGNOSIS — Z9884 Bariatric surgery status: Secondary | ICD-10-CM | POA: Diagnosis not present

## 2020-04-27 DIAGNOSIS — N6092 Unspecified benign mammary dysplasia of left breast: Secondary | ICD-10-CM | POA: Diagnosis not present

## 2020-04-27 DIAGNOSIS — C50812 Malignant neoplasm of overlapping sites of left female breast: Secondary | ICD-10-CM | POA: Diagnosis not present

## 2020-04-27 DIAGNOSIS — N6091 Unspecified benign mammary dysplasia of right breast: Secondary | ICD-10-CM | POA: Diagnosis not present

## 2020-04-27 DIAGNOSIS — C50912 Malignant neoplasm of unspecified site of left female breast: Secondary | ICD-10-CM | POA: Diagnosis not present

## 2020-04-27 DIAGNOSIS — Z170 Estrogen receptor positive status [ER+]: Secondary | ICD-10-CM | POA: Diagnosis not present

## 2020-04-27 DIAGNOSIS — Z87891 Personal history of nicotine dependence: Secondary | ICD-10-CM | POA: Diagnosis not present

## 2020-04-27 DIAGNOSIS — Z803 Family history of malignant neoplasm of breast: Secondary | ICD-10-CM | POA: Diagnosis not present

## 2020-04-28 DIAGNOSIS — Z87891 Personal history of nicotine dependence: Secondary | ICD-10-CM | POA: Diagnosis not present

## 2020-04-28 DIAGNOSIS — Z170 Estrogen receptor positive status [ER+]: Secondary | ICD-10-CM | POA: Diagnosis not present

## 2020-04-28 DIAGNOSIS — Z803 Family history of malignant neoplasm of breast: Secondary | ICD-10-CM | POA: Diagnosis not present

## 2020-04-28 DIAGNOSIS — Z9884 Bariatric surgery status: Secondary | ICD-10-CM | POA: Diagnosis not present

## 2020-04-28 DIAGNOSIS — N6092 Unspecified benign mammary dysplasia of left breast: Secondary | ICD-10-CM | POA: Diagnosis not present

## 2020-04-28 DIAGNOSIS — N6091 Unspecified benign mammary dysplasia of right breast: Secondary | ICD-10-CM | POA: Diagnosis not present

## 2020-04-28 DIAGNOSIS — C50912 Malignant neoplasm of unspecified site of left female breast: Secondary | ICD-10-CM | POA: Diagnosis not present

## 2020-04-28 DIAGNOSIS — C50812 Malignant neoplasm of overlapping sites of left female breast: Secondary | ICD-10-CM | POA: Diagnosis not present

## 2020-05-13 DIAGNOSIS — Z68.41 Body mass index (BMI) pediatric, greater than or equal to 95th percentile for age: Secondary | ICD-10-CM | POA: Diagnosis not present

## 2020-05-26 DIAGNOSIS — Z713 Dietary counseling and surveillance: Secondary | ICD-10-CM | POA: Diagnosis not present

## 2020-05-26 DIAGNOSIS — Z48815 Encounter for surgical aftercare following surgery on the digestive system: Secondary | ICD-10-CM | POA: Diagnosis not present

## 2020-05-26 DIAGNOSIS — K912 Postsurgical malabsorption, not elsewhere classified: Secondary | ICD-10-CM | POA: Diagnosis not present

## 2020-05-26 DIAGNOSIS — Z9884 Bariatric surgery status: Secondary | ICD-10-CM | POA: Diagnosis not present

## 2020-06-18 DIAGNOSIS — Z170 Estrogen receptor positive status [ER+]: Secondary | ICD-10-CM | POA: Diagnosis not present

## 2020-06-18 DIAGNOSIS — I10 Essential (primary) hypertension: Secondary | ICD-10-CM | POA: Diagnosis not present

## 2020-06-18 DIAGNOSIS — Z9889 Other specified postprocedural states: Secondary | ICD-10-CM | POA: Diagnosis not present

## 2020-06-18 DIAGNOSIS — Z6841 Body Mass Index (BMI) 40.0 and over, adult: Secondary | ICD-10-CM | POA: Diagnosis not present

## 2020-06-18 DIAGNOSIS — Z9884 Bariatric surgery status: Secondary | ICD-10-CM | POA: Diagnosis not present

## 2020-06-18 DIAGNOSIS — Z923 Personal history of irradiation: Secondary | ICD-10-CM | POA: Diagnosis not present

## 2020-06-18 DIAGNOSIS — Z8616 Personal history of COVID-19: Secondary | ICD-10-CM | POA: Diagnosis not present

## 2020-06-18 DIAGNOSIS — Z9289 Personal history of other medical treatment: Secondary | ICD-10-CM | POA: Diagnosis not present

## 2020-06-18 DIAGNOSIS — I872 Venous insufficiency (chronic) (peripheral): Secondary | ICD-10-CM | POA: Diagnosis not present

## 2020-06-18 DIAGNOSIS — C50912 Malignant neoplasm of unspecified site of left female breast: Secondary | ICD-10-CM | POA: Diagnosis not present

## 2020-08-03 DIAGNOSIS — Z6841 Body Mass Index (BMI) 40.0 and over, adult: Secondary | ICD-10-CM | POA: Diagnosis not present

## 2020-08-03 DIAGNOSIS — Z9884 Bariatric surgery status: Secondary | ICD-10-CM | POA: Diagnosis not present

## 2020-08-03 DIAGNOSIS — Z713 Dietary counseling and surveillance: Secondary | ICD-10-CM | POA: Diagnosis not present

## 2020-09-02 DIAGNOSIS — Z9884 Bariatric surgery status: Secondary | ICD-10-CM | POA: Diagnosis not present

## 2020-09-02 DIAGNOSIS — Z48815 Encounter for surgical aftercare following surgery on the digestive system: Secondary | ICD-10-CM | POA: Diagnosis not present

## 2020-09-02 DIAGNOSIS — K912 Postsurgical malabsorption, not elsewhere classified: Secondary | ICD-10-CM | POA: Diagnosis not present

## 2020-09-02 DIAGNOSIS — Z713 Dietary counseling and surveillance: Secondary | ICD-10-CM | POA: Diagnosis not present

## 2020-09-30 DIAGNOSIS — R922 Inconclusive mammogram: Secondary | ICD-10-CM | POA: Diagnosis not present

## 2020-09-30 DIAGNOSIS — Z853 Personal history of malignant neoplasm of breast: Secondary | ICD-10-CM | POA: Diagnosis not present

## 2020-09-30 DIAGNOSIS — C50812 Malignant neoplasm of overlapping sites of left female breast: Secondary | ICD-10-CM | POA: Diagnosis not present

## 2020-09-30 DIAGNOSIS — Z170 Estrogen receptor positive status [ER+]: Secondary | ICD-10-CM | POA: Diagnosis not present

## 2020-10-01 DIAGNOSIS — Z170 Estrogen receptor positive status [ER+]: Secondary | ICD-10-CM | POA: Diagnosis not present

## 2020-10-01 DIAGNOSIS — Z87891 Personal history of nicotine dependence: Secondary | ICD-10-CM | POA: Diagnosis not present

## 2020-10-01 DIAGNOSIS — C50912 Malignant neoplasm of unspecified site of left female breast: Secondary | ICD-10-CM | POA: Diagnosis not present

## 2020-10-01 DIAGNOSIS — N6092 Unspecified benign mammary dysplasia of left breast: Secondary | ICD-10-CM | POA: Diagnosis not present

## 2020-10-01 DIAGNOSIS — C50812 Malignant neoplasm of overlapping sites of left female breast: Secondary | ICD-10-CM | POA: Diagnosis not present

## 2020-10-01 DIAGNOSIS — Z9884 Bariatric surgery status: Secondary | ICD-10-CM | POA: Diagnosis not present

## 2020-10-01 DIAGNOSIS — Z6841 Body Mass Index (BMI) 40.0 and over, adult: Secondary | ICD-10-CM | POA: Diagnosis not present

## 2020-10-01 DIAGNOSIS — Z803 Family history of malignant neoplasm of breast: Secondary | ICD-10-CM | POA: Diagnosis not present

## 2020-10-01 DIAGNOSIS — N6091 Unspecified benign mammary dysplasia of right breast: Secondary | ICD-10-CM | POA: Diagnosis not present

## 2020-10-05 DIAGNOSIS — D225 Melanocytic nevi of trunk: Secondary | ICD-10-CM | POA: Diagnosis not present

## 2020-10-05 DIAGNOSIS — D2262 Melanocytic nevi of left upper limb, including shoulder: Secondary | ICD-10-CM | POA: Diagnosis not present

## 2020-10-05 DIAGNOSIS — L853 Xerosis cutis: Secondary | ICD-10-CM | POA: Diagnosis not present

## 2020-10-05 DIAGNOSIS — D2261 Melanocytic nevi of right upper limb, including shoulder: Secondary | ICD-10-CM | POA: Diagnosis not present

## 2020-11-16 DIAGNOSIS — Z713 Dietary counseling and surveillance: Secondary | ICD-10-CM | POA: Diagnosis not present

## 2020-11-16 DIAGNOSIS — Z9884 Bariatric surgery status: Secondary | ICD-10-CM | POA: Diagnosis not present

## 2020-11-16 DIAGNOSIS — Z6841 Body Mass Index (BMI) 40.0 and over, adult: Secondary | ICD-10-CM | POA: Diagnosis not present

## 2020-11-16 DIAGNOSIS — K912 Postsurgical malabsorption, not elsewhere classified: Secondary | ICD-10-CM | POA: Diagnosis not present

## 2020-11-16 DIAGNOSIS — Z48815 Encounter for surgical aftercare following surgery on the digestive system: Secondary | ICD-10-CM | POA: Diagnosis not present

## 2020-11-24 DIAGNOSIS — M25612 Stiffness of left shoulder, not elsewhere classified: Secondary | ICD-10-CM | POA: Diagnosis not present

## 2020-11-24 DIAGNOSIS — Z170 Estrogen receptor positive status [ER+]: Secondary | ICD-10-CM | POA: Diagnosis not present

## 2020-11-24 DIAGNOSIS — C50812 Malignant neoplasm of overlapping sites of left female breast: Secondary | ICD-10-CM | POA: Diagnosis not present

## 2020-11-24 DIAGNOSIS — I890 Lymphedema, not elsewhere classified: Secondary | ICD-10-CM | POA: Diagnosis not present

## 2020-12-01 DIAGNOSIS — Z170 Estrogen receptor positive status [ER+]: Secondary | ICD-10-CM | POA: Diagnosis not present

## 2020-12-01 DIAGNOSIS — I890 Lymphedema, not elsewhere classified: Secondary | ICD-10-CM | POA: Diagnosis not present

## 2020-12-01 DIAGNOSIS — C50812 Malignant neoplasm of overlapping sites of left female breast: Secondary | ICD-10-CM | POA: Diagnosis not present

## 2020-12-01 DIAGNOSIS — M25612 Stiffness of left shoulder, not elsewhere classified: Secondary | ICD-10-CM | POA: Diagnosis not present

## 2020-12-03 DIAGNOSIS — Z170 Estrogen receptor positive status [ER+]: Secondary | ICD-10-CM | POA: Diagnosis not present

## 2020-12-03 DIAGNOSIS — C50812 Malignant neoplasm of overlapping sites of left female breast: Secondary | ICD-10-CM | POA: Diagnosis not present

## 2020-12-03 DIAGNOSIS — M25612 Stiffness of left shoulder, not elsewhere classified: Secondary | ICD-10-CM | POA: Diagnosis not present

## 2020-12-03 DIAGNOSIS — I890 Lymphedema, not elsewhere classified: Secondary | ICD-10-CM | POA: Diagnosis not present

## 2020-12-17 DIAGNOSIS — Z4432 Encounter for fitting and adjustment of external left breast prosthesis: Secondary | ICD-10-CM | POA: Diagnosis not present

## 2020-12-17 DIAGNOSIS — C50112 Malignant neoplasm of central portion of left female breast: Secondary | ICD-10-CM | POA: Diagnosis not present

## 2020-12-17 DIAGNOSIS — I890 Lymphedema, not elsewhere classified: Secondary | ICD-10-CM | POA: Diagnosis not present

## 2021-01-01 ENCOUNTER — Other Ambulatory Visit: Payer: BC Managed Care – PPO

## 2021-02-11 DIAGNOSIS — I872 Venous insufficiency (chronic) (peripheral): Secondary | ICD-10-CM | POA: Diagnosis not present

## 2021-02-11 DIAGNOSIS — G2581 Restless legs syndrome: Secondary | ICD-10-CM | POA: Diagnosis not present

## 2021-02-11 DIAGNOSIS — Z6839 Body mass index (BMI) 39.0-39.9, adult: Secondary | ICD-10-CM | POA: Diagnosis not present

## 2021-02-11 DIAGNOSIS — I10 Essential (primary) hypertension: Secondary | ICD-10-CM | POA: Diagnosis not present

## 2021-02-11 DIAGNOSIS — Z9884 Bariatric surgery status: Secondary | ICD-10-CM | POA: Diagnosis not present

## 2021-02-11 DIAGNOSIS — Z170 Estrogen receptor positive status [ER+]: Secondary | ICD-10-CM | POA: Diagnosis not present

## 2021-02-11 DIAGNOSIS — Z9012 Acquired absence of left breast and nipple: Secondary | ICD-10-CM | POA: Diagnosis not present

## 2021-02-11 DIAGNOSIS — C50912 Malignant neoplasm of unspecified site of left female breast: Secondary | ICD-10-CM | POA: Diagnosis not present

## 2021-02-16 ENCOUNTER — Other Ambulatory Visit: Payer: Self-pay | Admitting: Obstetrics and Gynecology

## 2021-02-16 ENCOUNTER — Telehealth: Payer: Self-pay | Admitting: Obstetrics and Gynecology

## 2021-02-16 NOTE — Telephone Encounter (Signed)
Appt made for 3/2 at 215. Pt aware. Advised pt that if she is soaking a pad every hour for the last 2-3 hours she may want to go to ED.

## 2021-02-16 NOTE — Telephone Encounter (Signed)
Pt called this morning about menstrual period started Saturday- states severe bleeding using both tampons and pads- states soaking every hour. Pt states a lot of discomfort in lower pelvic area. Pt states states history of breast cancer- currently taking tamoxifen that causing irregular menstrual cycles- pt is questioning when she needs to go to go ER, pt has apt with [facility] on 02-23-2021. Please Advise.

## 2021-02-16 NOTE — Telephone Encounter (Signed)
Mailbox full- Will send my chart message.

## 2021-02-17 ENCOUNTER — Ambulatory Visit (INDEPENDENT_AMBULATORY_CARE_PROVIDER_SITE_OTHER): Payer: BC Managed Care – PPO | Admitting: Obstetrics and Gynecology

## 2021-02-17 ENCOUNTER — Encounter: Payer: Self-pay | Admitting: Obstetrics and Gynecology

## 2021-02-17 ENCOUNTER — Other Ambulatory Visit: Payer: Self-pay

## 2021-02-17 VITALS — BP 116/69 | HR 93 | Ht 65.0 in | Wt 237.2 lb

## 2021-02-17 DIAGNOSIS — Z853 Personal history of malignant neoplasm of breast: Secondary | ICD-10-CM

## 2021-02-17 DIAGNOSIS — N921 Excessive and frequent menstruation with irregular cycle: Secondary | ICD-10-CM | POA: Diagnosis not present

## 2021-02-17 MED ORDER — TRANEXAMIC ACID 650 MG PO TABS: 1300.0000 mg | ORAL_TABLET | Freq: Three times a day (TID) | ORAL | 2 refills | Status: DC

## 2021-02-17 NOTE — Progress Notes (Signed)
HPI:      [name] is a 50 y.o. [id number] who LMP was No LMP recorded (lmp unknown).  Subjective:   She presents today with complaint of very heavy menstrual bleeding since Sunday.  She states that she has become lightheaded a couple times.  She states that her bleeding remains heavy today. Of significant note, patient has recently been diagnosed with breast cancer and has been on tamoxifen approximately 6 months after lumpectomy and radiation. She was having regular cycles her last year her cycles became somewhat irregular.  She is not sure if she is in menopause. Breast cancer was ER/PR positive.    Hx: The following portions of the patient's history were reviewed and updated as appropriate:             She  has a past medical history of Blood in urine (2014), Breast mass (2013), BV (bacterial vaginosis), Cancer ([place]), Edema, Hypertension, and Thyroid disease. She does not have any pertinent problems on file. She  has a past surgical history that includes Cesarean section (2007); Dilation and curettage of uterus; Roux-en-y procedure (03/04/2014); Breast biopsy (Right, 2013); Breast biopsy (Left, 06-12-13); Breast biopsy (Right, 06-17-13); Foot surgery (Left); and Tubal ligation. Her family history includes Arthritis in her mother; Cancer (age of onset: 48) in her maternal grandmother; Diabetes in her father; Hypertension in her father; Hypothyroidism in her sister; Polycystic ovary syndrome in her sister. She  reports that she has quit smoking. She has never used smokeless tobacco. She reports current alcohol use. She reports that she does not use drugs. She has a current medication list which includes the following prescription(s): hydrochlorothiazide, multivitamins with iron, tamoxifen, tranexamic acid, and wegovy. She is allergic to tape and avelox [moxifloxacin hcl in nacl].       Review of Systems:  Review of Systems  Constitutional: Denied constitutional symptoms, night sweats,  recent illness, fatigue, fever, insomnia and weight loss.  Eyes: Denied eye symptoms, eye pain, photophobia, vision change and visual disturbance.  Ears/Nose/Throat/Neck: Denied ear, nose, throat or neck symptoms, hearing loss, nasal discharge, sinus congestion and sore throat.  Cardiovascular: Denied cardiovascular symptoms, arrhythmia, chest pain/pressure, edema, exercise intolerance, orthopnea and palpitations.  Respiratory: Denied pulmonary symptoms, asthma, pleuritic pain, productive sputum, cough, dyspnea and wheezing.  Gastrointestinal: Denied, gastro-esophageal reflux, melena, nausea and vomiting.  Genitourinary: See HPI for additional information.  Musculoskeletal: Denied musculoskeletal symptoms, stiffness, swelling, muscle weakness and myalgia.  Dermatologic: Denied dermatology symptoms, rash and scar.  Neurologic: Denied neurology symptoms, dizziness, headache, neck pain and syncope.  Psychiatric: Denied psychiatric symptoms, anxiety and depression.  Endocrine: Denied endocrine symptoms including hot flashes and night sweats.   Meds:   Current Outpatient Medications on File Prior to Visit  Medication Sig Dispense Refill  . hydrochlorothiazide (HYDRODIURIL) 25 MG tablet Take 1 tablet (25 mg total) by mouth daily. 90 tablet 3  . Multiple Vitamins-Iron (MULTIVITAMINS WITH IRON) TABS tablet Take 1 tablet by mouth daily.    . tamoxifen (NOLVADEX) 20 MG tablet Take 20 mg by mouth daily.    . WEGOVY 1.7 MG/0.75ML SOAJ INJECT 0.75ML (1.7MG ) SQ ONCE A WEEK     No current facility-administered medications on file prior to visit.          Objective:     Vitals:   02/17/21 1438  BP: 116/69  Pulse: 93   Filed Weights   02/17/21 1438  Weight: 237 lb 4 oz (107.6 kg)  Assessment:    [id number] Patient Active Problem List   Diagnosis Date Noted  . SOB (shortness of breath) 09/04/2019  . Otitis media 08/30/2019  . Suspected COVID-19 virus infection 08/29/2019   . Leg swelling 09/03/2018  . Acute sinusitis 06/15/2017  . Climacteric 03/30/2017  . Varicose veins 11/17/2015  . Hearing loss 05/25/2015  . Hair loss 11/17/2014  . Status post tubal ligation 09/22/2014  . Obstructive sleep apnea 06/27/2013  . Routine general medical examination at a health care facility 05/15/2013  . Goiter, nontoxic, multinodular 02/25/2013  . Obesity 11/12/2012  . Hypertension 05/09/2012     1. Menorrhagia with irregular cycle   2. History of breast cancer     Does not know where the patient is pre or postmenopausal.  Tamoxifen is a small risk for endometrial polyps and hyperplasia.  Do not want to give systemic progesterone because of the ER/PR positive nature of breast cancer.   Plan:            1.  Because the patient is currently symptomatic and heavily bleeding plan use of TXA  2.  [place] today  3.  Follow-up next week for endometrial biopsy and Mirena IUD.  Orders Orders Placed This Encounter  Procedures  . Follicle stimulating hormone     Meds ordered this encounter  Medications  . tranexamic acid (LYSTEDA) 650 MG TABS tablet    Sig: Take 2 tablets (1,300 mg total) by mouth 3 (three) times daily. Take during menses for a maximum of five days    Dispense:  30 tablet    Refill:  2      F/U  Return in about 1 week (around 02/24/2021). I spent 31 minutes involved in the care of this patient preparing to see the patient by obtaining and reviewing her medical history (including labs, imaging tests and prior procedures), documenting clinical information in the electronic health record (EHR), counseling and coordinating care plans, writing and sending prescriptions, ordering tests or procedures and directly communicating with the patient by discussing pertinent items from her history and physical exam as well as detailing my assessment and plan as noted above so that she has an informed understanding.  All of her questions were answered.  Finis Bud,  M.D. 02/17/2021 3:07 PM

## 2021-02-18 LAB — FOLLICLE STIMULATING HORMONE: FSH: 17.1 m[IU]/mL

## 2021-02-18 LAB — CBC
Hematocrit: 33.8 % — ABNORMAL LOW (ref 34.0–46.6)
Hemoglobin: 11.3 g/dL (ref 11.1–15.9)
MCH: 30 pg (ref 26.6–33.0)
MCHC: 33.4 g/dL (ref 31.5–35.7)
MCV: 90 fL (ref 79–97)
Platelets: 239 10*3/uL (ref 150–450)
RBC: 3.77 x10E6/uL (ref 3.77–5.28)
RDW: 12.7 % (ref 11.7–15.4)
WBC: 6.3 10*3/uL (ref 3.4–10.8)

## 2021-02-23 ENCOUNTER — Ambulatory Visit (INDEPENDENT_AMBULATORY_CARE_PROVIDER_SITE_OTHER): Payer: BC Managed Care – PPO | Admitting: Obstetrics and Gynecology

## 2021-02-23 ENCOUNTER — Encounter: Payer: Self-pay | Admitting: Obstetrics and Gynecology

## 2021-02-23 ENCOUNTER — Other Ambulatory Visit: Payer: Self-pay

## 2021-02-23 ENCOUNTER — Other Ambulatory Visit (HOSPITAL_COMMUNITY)
Admission: RE | Admit: 2021-02-23 | Discharge: 2021-02-23 | Disposition: A | Discharge status: Home / Self Care | Payer: BC Managed Care – PPO | Source: Ambulatory Visit | Attending: Obstetrics and Gynecology | Admitting: Obstetrics and Gynecology

## 2021-02-23 VITALS — BP 129/72 | HR 85 | Ht 65.0 in | Wt 238.4 lb

## 2021-02-23 DIAGNOSIS — Z853 Personal history of malignant neoplasm of breast: Secondary | ICD-10-CM

## 2021-02-23 DIAGNOSIS — N938 Other specified abnormal uterine and vaginal bleeding: Secondary | ICD-10-CM | POA: Diagnosis not present

## 2021-02-23 NOTE — Progress Notes (Signed)
HPI:      [name] is a 50 y.o. [id number] who LMP was Patient's last menstrual period was 02/13/2021.  Subjective:   She presents today for endometrial biopsy as patient has had irregular heavy bleeding on tamoxifen.  She has a history of ER/PR positive breast cancer.  She has had several episodes in the last year of heavy bleeding.  She was given TXA and her bleeding stopped quickly.  She is no longer taking the TXA. After long discussion she has declined IUD use today.  She states she wants to get a Pap smear and get the results of the endometrial biopsy before considering this.    Hx: The following portions of the patient's history were reviewed and updated as appropriate:             She  has a past medical history of Blood in urine (2014), Breast mass (2013), BV (bacterial vaginosis), Cancer ([place]), Edema, Hypertension, and Thyroid disease. She does not have any pertinent problems on file. She  has a past surgical history that includes Cesarean section (2007); Dilation and curettage of uterus; Roux-en-y procedure (03/04/2014); Breast biopsy (Right, 2013); Breast biopsy (Left, 06-12-13); Breast biopsy (Right, 06-17-13); Foot surgery (Left); and Tubal ligation. Her family history includes Arthritis in her mother; Cancer (age of onset: 75) in her maternal grandmother; Diabetes in her father; Hypertension in her father; Hypothyroidism in her sister; Polycystic ovary syndrome in her sister. She  reports that she has quit smoking. She has never used smokeless tobacco. She reports current alcohol use. She reports that she does not use drugs. She has a current medication list which includes the following prescription(s): hydrochlorothiazide, multivitamins with iron, tamoxifen, wegovy, and tranexamic acid. She is allergic to tape and avelox [moxifloxacin hcl in nacl].       Review of Systems:  Review of Systems  Constitutional: Denied constitutional symptoms, night sweats, recent illness,  fatigue, fever, insomnia and weight loss.  Eyes: Denied eye symptoms, eye pain, photophobia, vision change and visual disturbance.  Ears/Nose/Throat/Neck: Denied ear, nose, throat or neck symptoms, hearing loss, nasal discharge, sinus congestion and sore throat.  Cardiovascular: Denied cardiovascular symptoms, arrhythmia, chest pain/pressure, edema, exercise intolerance, orthopnea and palpitations.  Respiratory: Denied pulmonary symptoms, asthma, pleuritic pain, productive sputum, cough, dyspnea and wheezing.  Gastrointestinal: Denied, gastro-esophageal reflux, melena, nausea and vomiting.  Genitourinary: Denied genitourinary symptoms including symptomatic vaginal discharge, pelvic relaxation issues, and urinary complaints.  Musculoskeletal: Denied musculoskeletal symptoms, stiffness, swelling, muscle weakness and myalgia.  Dermatologic: Denied dermatology symptoms, rash and scar.  Neurologic: Denied neurology symptoms, dizziness, headache, neck pain and syncope.  Psychiatric: Denied psychiatric symptoms, anxiety and depression.  Endocrine: Denied endocrine symptoms including hot flashes and night sweats.   Meds:   Current Outpatient Medications on File Prior to Visit  Medication Sig Dispense Refill  . hydrochlorothiazide (HYDRODIURIL) 25 MG tablet Take 1 tablet (25 mg total) by mouth daily. 90 tablet 3  . Multiple Vitamins-Iron (MULTIVITAMINS WITH IRON) TABS tablet Take 1 tablet by mouth daily.    . tamoxifen (NOLVADEX) 20 MG tablet Take 20 mg by mouth daily.    . WEGOVY 1.7 MG/0.75ML SOAJ INJECT 0.75ML (1.7MG ) SQ ONCE A WEEK    . tranexamic acid (LYSTEDA) 650 MG TABS tablet Take 2 tablets (1,300 mg total) by mouth 3 (three) times daily. Take during menses for a maximum of five days (Patient not taking: Reported on 02/23/2021) 30 tablet 2   No current facility-administered medications on file  prior to visit.          Objective:     Vitals:   02/23/21 0806  BP: 129/72  Pulse: 85    Filed Weights   02/23/21 0806  Weight: 238 lb 6.4 oz (108.1 kg)              Physical examination   Pelvic:   Vulva: Normal appearance.  No lesions.  Vagina: No lesions or abnormalities noted.  Support:  Second-degree pelvic relaxation second-degree cystocele second-degree rectocele  Urethra No masses tenderness or scarring.  Meatus Normal size without lesions or prolapse.  Cervix: Normal appearance.  No lesions.  Anus: Normal exam.  No lesions.  Perineum: Normal exam.  No lesions.        Bimanual   Uterus: Normal size.  Non-tender.  Mobile.  AV.  Adnexae: No masses.  Non-tender to palpation.  Cul-de-sac: Negative for abnormality.   Endometrial Biopsy After discussion with the patient regarding her abnormal uterine bleeding I recommended that she proceed with an endometrial biopsy for further diagnosis. The risks, benefits, alternatives, and indications for an endometrial biopsy were discussed with the patient in detail. She understood the risks including infection, bleeding, cervical laceration and uterine perforation.  Verbal consent was obtained.   PROCEDURE NOTE:  Vacurette endometrial biopsy was performed using aseptic technique with iodine preparation.  The uterus was sounded to a length of 8 cm.  Adequate sampling was obtained with minimal blood loss.  Very scant amount of tissue obtained the patient tolerated the procedure well.  Disposition will be pending pathology   Assessment:    [id number] Patient Active Problem List   Diagnosis Date Noted  . SOB (shortness of breath) 09/04/2019  . Otitis media 08/30/2019  . Suspected COVID-19 virus infection 08/29/2019  . Leg swelling 09/03/2018  . Acute sinusitis 06/15/2017  . Climacteric 03/30/2017  . Varicose veins 11/17/2015  . Hearing loss 05/25/2015  . Hair loss 11/17/2014  . Status post tubal ligation 09/22/2014  . Obstructive sleep apnea 06/27/2013  . Routine general medical examination at a health care facility  05/15/2013  . Goiter, nontoxic, multinodular 02/25/2013  . Obesity 11/12/2012  . Hypertension 05/09/2012     1. DUB (dysfunctional uterine bleeding)   2. History of breast cancer     Patient on tamoxifen   Plan:            1.  Endometrial biopsy performed will contact patient with any abnormal results  2.  Follow-up in 1 month for annual examination Pap smear  3.  After long discussion regarding IUD risks and benefits and usefulness with tamoxifen and irregular bleeding patient has declined.  She will consider this for the future.  Orders No orders of the defined types were placed in this encounter.   No orders of the defined types were placed in this encounter.     F/U  Return in about 1 month (around 03/26/2021) for Annual Physical. I spent 31 minutes involved in the care of this patient preparing to see the patient by obtaining and reviewing her medical history (including labs, imaging tests and prior procedures), documenting clinical information in the electronic health record (EHR), counseling and coordinating care plans, writing and sending prescriptions, ordering tests or procedures and directly communicating with the patient by discussing pertinent items from her history and physical exam as well as detailing my assessment and plan as noted above so that she has an informed understanding.  All of her questions were answered.  Finis Bud, M.D. 02/23/2021 8:56 AM

## 2021-02-23 NOTE — Addendum Note (Signed)
Addended by: Durwin Glaze on: 02/23/2021 10:57 AM   Modules accepted: Orders

## 2021-02-24 LAB — SURGICAL PATHOLOGY

## 2021-03-02 DIAGNOSIS — Z6839 Body mass index (BMI) 39.0-39.9, adult: Secondary | ICD-10-CM | POA: Diagnosis not present

## 2021-03-02 DIAGNOSIS — Z170 Estrogen receptor positive status [ER+]: Secondary | ICD-10-CM | POA: Diagnosis not present

## 2021-03-02 DIAGNOSIS — C50912 Malignant neoplasm of unspecified site of left female breast: Secondary | ICD-10-CM | POA: Diagnosis not present

## 2021-03-10 DIAGNOSIS — Z9884 Bariatric surgery status: Secondary | ICD-10-CM | POA: Diagnosis not present

## 2021-03-30 ENCOUNTER — Other Ambulatory Visit: Payer: Self-pay

## 2021-03-30 ENCOUNTER — Ambulatory Visit (INDEPENDENT_AMBULATORY_CARE_PROVIDER_SITE_OTHER): Payer: BC Managed Care – PPO | Admitting: Obstetrics and Gynecology

## 2021-03-30 ENCOUNTER — Other Ambulatory Visit (HOSPITAL_COMMUNITY)
Admission: RE | Admit: 2021-03-30 | Discharge: 2021-03-30 | Disposition: A | Discharge status: Home / Self Care | Payer: BC Managed Care – PPO | Source: Ambulatory Visit | Attending: Obstetrics and Gynecology | Admitting: Obstetrics and Gynecology

## 2021-03-30 ENCOUNTER — Encounter: Payer: Self-pay | Admitting: Obstetrics and Gynecology

## 2021-03-30 VITALS — BP 111/67 | HR 89 | Ht 65.0 in | Wt 231.5 lb

## 2021-03-30 DIAGNOSIS — Z124 Encounter for screening for malignant neoplasm of cervix: Secondary | ICD-10-CM | POA: Diagnosis not present

## 2021-03-30 DIAGNOSIS — Z853 Personal history of malignant neoplasm of breast: Secondary | ICD-10-CM

## 2021-03-30 DIAGNOSIS — Z01419 Encounter for gynecological examination (general) (routine) without abnormal findings: Secondary | ICD-10-CM | POA: Diagnosis not present

## 2021-03-30 DIAGNOSIS — L538 Other specified erythematous conditions: Secondary | ICD-10-CM | POA: Diagnosis not present

## 2021-03-30 DIAGNOSIS — L820 Inflamed seborrheic keratosis: Secondary | ICD-10-CM | POA: Diagnosis not present

## 2021-03-30 DIAGNOSIS — R208 Other disturbances of skin sensation: Secondary | ICD-10-CM | POA: Diagnosis not present

## 2021-03-30 LAB — POCT URINALYSIS DIPSTICK
Bilirubin, UA: NEGATIVE
Blood, UA: NEGATIVE
Glucose, UA: NEGATIVE
Ketones, UA: NEGATIVE
Leukocytes, UA: NEGATIVE
Nitrite, UA: NEGATIVE
Protein, UA: NEGATIVE
Spec Grav, UA: 1.01 (ref 1.010–1.025)
Urobilinogen, UA: 0.2 E.U./dL
pH, UA: 6 (ref 5.0–8.0)

## 2021-03-30 NOTE — Addendum Note (Signed)
Addended by: Durwin Glaze on: 03/30/2021 01:22 PM   Modules accepted: Orders

## 2021-03-30 NOTE — Progress Notes (Signed)
HPI:      [name] is a 50 y.o. [id number] who LMP was Patient's last menstrual period was 02/17/2021.  Subjective:   She presents today for her annual examination.  She has no complaints.  She recently underwent an endometrial biopsy which showed no hyperplasia or malignancy.  She has had no problems with bleeding since that time.  Prior to that she had significant bleeding that required TXA for resolution. Of significant note patient is [facility] at [facility] for history of breast cancer. (ER/PR positive) most recent mammogram negative.    Hx: The following portions of the patient's history were reviewed and updated as appropriate:             She  has a past medical history of Blood in urine (2014), Breast mass (2013), BV (bacterial vaginosis), Cancer ([place]), Edema, Hypertension, and Thyroid disease. She does not have any pertinent problems on file. She  has a past surgical history that includes Cesarean section (2007); Dilation and curettage of uterus; Roux-en-y procedure (03/04/2014); Breast biopsy (Right, 2013); Breast biopsy (Left, 06-12-13); Breast biopsy (Right, 06-17-13); Foot surgery (Left); and Tubal ligation. Her family history includes Arthritis in her mother; Cancer (age of onset: 46) in her maternal grandmother; Diabetes in her father; Hypertension in her father; Hypothyroidism in her sister; Polycystic ovary syndrome in her sister. She  reports that she has quit smoking. She has never used smokeless tobacco. She reports current alcohol use. She reports that she does not use drugs. She has a current medication list which includes the following prescription(s): hydrochlorothiazide, multivitamins with iron, tamoxifen, wegovy, and tranexamic acid. She is allergic to tape and avelox [moxifloxacin hcl in nacl].       Review of Systems:  Review of Systems  Constitutional: Denied constitutional symptoms, night sweats, recent illness, fatigue, fever, insomnia and weight loss.  Eyes:  Denied eye symptoms, eye pain, photophobia, vision change and visual disturbance.  Ears/Nose/Throat/Neck: Denied ear, nose, throat or neck symptoms, hearing loss, nasal discharge, sinus congestion and sore throat.  Cardiovascular: Denied cardiovascular symptoms, arrhythmia, chest pain/pressure, edema, exercise intolerance, orthopnea and palpitations.  Respiratory: Denied pulmonary symptoms, asthma, pleuritic pain, productive sputum, cough, dyspnea and wheezing.  Gastrointestinal: Denied, gastro-esophageal reflux, melena, nausea and vomiting.  Genitourinary: Denied genitourinary symptoms including symptomatic vaginal discharge, pelvic relaxation issues, and urinary complaints.  Musculoskeletal: Denied musculoskeletal symptoms, stiffness, swelling, muscle weakness and myalgia.  Dermatologic: Denied dermatology symptoms, rash and scar.  Neurologic: Denied neurology symptoms, dizziness, headache, neck pain and syncope.  Psychiatric: Denied psychiatric symptoms, anxiety and depression.  Endocrine: Denied endocrine symptoms including hot flashes and night sweats.   Meds:   Current Outpatient Medications on File Prior to Visit  Medication Sig Dispense Refill  . hydrochlorothiazide (HYDRODIURIL) 25 MG tablet Take 1 tablet (25 mg total) by mouth daily. 90 tablet 3  . Multiple Vitamins-Iron (MULTIVITAMINS WITH IRON) TABS tablet Take 1 tablet by mouth daily.    . tamoxifen (NOLVADEX) 20 MG tablet Take 20 mg by mouth daily.    . WEGOVY 1.7 MG/0.75ML SOAJ INJECT 0.75ML (1.7MG ) SQ ONCE A WEEK    . tranexamic acid (LYSTEDA) 650 MG TABS tablet Take 2 tablets (1,300 mg total) by mouth 3 (three) times daily. Take during menses for a maximum of five days (Patient not taking: No sig reported) 30 tablet 2   No current facility-administered medications on file prior to visit.          Objective:     Vitals:  03/30/21 1011  BP: 111/67  Pulse: 89    Filed Weights   03/30/21 1011  Weight: 231 lb 8 oz  (105 kg)              Physical examination General NAD, Conversant  HEENT Atraumatic; Op clear with mmm.  Normo-cephalic. Pupils reactive. Anicteric sclerae  Thyroid/Neck Smooth without nodularity or enlargement. Normal ROM.  Neck Supple.  Skin No rashes, lesions or ulceration. Normal palpated skin turgor. No nodularity.  Breasts: No masses or discharge.  Symmetric.  Left with some noticeable radiation change.  No axillary adenopathy.  Lungs: Clear to auscultation.No rales or wheezes. Normal Respiratory effort, no retractions.  Heart: NSR.  No murmurs or rubs appreciated. No periferal edema  Abdomen: Soft.  Non-tender.  No masses.  No HSM. No hernia  Extremities: Moves all appropriately.  Normal ROM for age. No lymphadenopathy.  Neuro: Oriented to PPT.  Normal mood. Normal affect.     Pelvic:   Vulva: Normal appearance.  No lesions.  Vagina: No lesions or abnormalities noted.  Support: Normal pelvic support.  Second to third-degree cystocele  Urethra No masses tenderness or scarring.  Meatus Normal size without lesions or prolapse.  Cervix: Normal appearance.  No lesions.  Anus: Normal exam.  No lesions.  Perineum: Normal exam.  No lesions.        Bimanual   Uterus: Normal size.  Non-tender.  Mobile.  AV.  Adnexae: No masses.  Non-tender to palpation.  Cul-de-sac: Negative for abnormality.      Assessment:    [id number] Patient Active Problem List   Diagnosis Date Noted  . SOB (shortness of breath) 09/04/2019  . Otitis media 08/30/2019  . Suspected COVID-19 virus infection 08/29/2019  . Leg swelling 09/03/2018  . Acute sinusitis 06/15/2017  . Climacteric 03/30/2017  . Varicose veins 11/17/2015  . Hearing loss 05/25/2015  . Hair loss 11/17/2014  . Status post tubal ligation 09/22/2014  . Obstructive sleep apnea 06/27/2013  . Routine general medical examination at a health care facility 05/15/2013  . Goiter, nontoxic, multinodular 02/25/2013  . Obesity 11/12/2012  .  Hypertension 05/09/2012     1. Well woman exam with routine gynecological exam   2. History of breast cancer     Patient having no issues with bleeding at this time.   Plan:            1.  Basic Screening Recommendations The basic screening recommendations for asymptomatic women were discussed with the patient during her visit.  The age-appropriate recommendations were discussed with her and the rational for the tests reviewed.  When I am informed by the patient that another primary care physician has previously obtained the age-appropriate tests and they are up-to-date, only outstanding tests are ordered and referrals given as necessary.  Abnormal results of tests will be discussed with her when all of her results are completed.  Routine preventative health maintenance measures emphasized: Exercise/Diet/Weight control, Tobacco Warnings, Alcohol/Substance use risks and Stress Management Lab work ordered patient to return fasting -Pap performed -mammogram done in March through [facility]. Orders Orders Placed This Encounter  Procedures  . CBC  . Glucose, random  . Hemoglobin A1c  . TSH  . Lipid panel  . Basic metabolic panel  . POCT urinalysis dipstick    No orders of the defined types were placed in this encounter.         F/U  Return in about 1 year (around 03/30/2022) for Annual Physical.  Shanon Brow  Tawni Millers, M.D. 03/30/2021 10:36 AM

## 2021-03-31 ENCOUNTER — Other Ambulatory Visit: Payer: BC Managed Care – PPO

## 2021-04-02 LAB — CYTOLOGY - PAP
Comment: NEGATIVE
Diagnosis: NEGATIVE
Diagnosis: REACTIVE
High risk HPV: NEGATIVE

## 2021-04-07 ENCOUNTER — Other Ambulatory Visit: Payer: BC Managed Care – PPO

## 2021-04-07 ENCOUNTER — Other Ambulatory Visit: Payer: Self-pay

## 2021-04-07 DIAGNOSIS — Z01419 Encounter for gynecological examination (general) (routine) without abnormal findings: Secondary | ICD-10-CM | POA: Diagnosis not present

## 2021-04-08 LAB — CBC
Hematocrit: 37.2 % (ref 34.0–46.6)
Hemoglobin: 12.1 g/dL (ref 11.1–15.9)
MCH: 29.2 pg (ref 26.6–33.0)
MCHC: 32.5 g/dL (ref 31.5–35.7)
MCV: 90 fL (ref 79–97)
Platelets: 237 10*3/uL (ref 150–450)
RBC: 4.15 x10E6/uL (ref 3.77–5.28)
RDW: 12.7 % (ref 11.7–15.4)
WBC: 6.7 10*3/uL (ref 3.4–10.8)

## 2021-04-08 LAB — LIPID PANEL
Chol/HDL Ratio: 2.7 ratio (ref 0.0–4.4)
Cholesterol, Total: 129 mg/dL (ref 100–199)
HDL: 48 mg/dL (ref >39)
LDL Chol Calc (NIH): 64 mg/dL (ref 0–99)
Triglycerides: 91 mg/dL (ref 0–149)
VLDL Cholesterol Cal: 17 mg/dL (ref 5–40)

## 2021-04-08 LAB — BASIC METABOLIC PANEL
BUN/Creatinine Ratio: 18 (ref 9–23)
BUN: 12 mg/dL (ref 6–24)
CO2: 24 mmol/L (ref 20–29)
Calcium: 8.8 mg/dL (ref 8.7–10.2)
Chloride: 105 mmol/L (ref 96–106)
Creatinine, Ser: 0.66 mg/dL (ref 0.57–1.00)
Glucose: 87 mg/dL (ref 65–99)
Potassium: 4 mmol/L (ref 3.5–5.2)
Sodium: 143 mmol/L (ref 134–144)
eGFR: 107 mL/min/{1.73_m2} (ref >59)

## 2021-04-08 LAB — HEMOGLOBIN A1C
Est. average glucose Bld gHb Est-mCnc: 94 mg/dL
Hgb A1c MFr Bld: 4.9 % (ref 4.8–5.6)

## 2021-04-08 LAB — TSH: TSH: 1.13 u[IU]/mL (ref 0.450–4.500)

## 2021-04-22 DIAGNOSIS — H5213 Myopia, bilateral: Secondary | ICD-10-CM | POA: Diagnosis not present

## 2021-04-22 DIAGNOSIS — H2513 Age-related nuclear cataract, bilateral: Secondary | ICD-10-CM | POA: Diagnosis not present

## 2021-04-22 DIAGNOSIS — H353131 Nonexudative age-related macular degeneration, bilateral, early dry stage: Secondary | ICD-10-CM | POA: Diagnosis not present

## 2021-04-22 DIAGNOSIS — H52223 Regular astigmatism, bilateral: Secondary | ICD-10-CM | POA: Diagnosis not present

## 2021-05-13 DIAGNOSIS — Z713 Dietary counseling and surveillance: Secondary | ICD-10-CM | POA: Diagnosis not present

## 2021-05-13 DIAGNOSIS — Z9884 Bariatric surgery status: Secondary | ICD-10-CM | POA: Diagnosis not present

## 2021-05-13 DIAGNOSIS — E669 Obesity, unspecified: Secondary | ICD-10-CM | POA: Diagnosis not present

## 2021-06-15 DIAGNOSIS — K912 Postsurgical malabsorption, not elsewhere classified: Secondary | ICD-10-CM | POA: Diagnosis not present

## 2021-06-15 DIAGNOSIS — Z713 Dietary counseling and surveillance: Secondary | ICD-10-CM | POA: Diagnosis not present

## 2021-06-15 DIAGNOSIS — Z48815 Encounter for surgical aftercare following surgery on the digestive system: Secondary | ICD-10-CM | POA: Diagnosis not present

## 2021-06-15 DIAGNOSIS — Z9884 Bariatric surgery status: Secondary | ICD-10-CM | POA: Diagnosis not present

## 2021-06-17 DIAGNOSIS — Z6836 Body mass index (BMI) 36.0-36.9, adult: Secondary | ICD-10-CM | POA: Diagnosis not present

## 2021-06-17 DIAGNOSIS — R4189 Other symptoms and signs involving cognitive functions and awareness: Secondary | ICD-10-CM | POA: Diagnosis not present

## 2021-06-17 DIAGNOSIS — I10 Essential (primary) hypertension: Secondary | ICD-10-CM | POA: Diagnosis not present

## 2021-06-17 DIAGNOSIS — Z923 Personal history of irradiation: Secondary | ICD-10-CM | POA: Diagnosis not present

## 2021-06-17 DIAGNOSIS — Z8616 Personal history of COVID-19: Secondary | ICD-10-CM | POA: Diagnosis not present

## 2021-06-17 DIAGNOSIS — Z79810 Long term (current) use of selective estrogen receptor modulators (SERMs): Secondary | ICD-10-CM | POA: Diagnosis not present

## 2021-06-17 DIAGNOSIS — C50912 Malignant neoplasm of unspecified site of left female breast: Secondary | ICD-10-CM | POA: Diagnosis not present

## 2021-06-17 DIAGNOSIS — Z9012 Acquired absence of left breast and nipple: Secondary | ICD-10-CM | POA: Diagnosis not present

## 2021-06-17 DIAGNOSIS — Z170 Estrogen receptor positive status [ER+]: Secondary | ICD-10-CM | POA: Diagnosis not present

## 2021-07-05 DIAGNOSIS — K912 Postsurgical malabsorption, not elsewhere classified: Secondary | ICD-10-CM | POA: Diagnosis not present

## 2021-07-14 ENCOUNTER — Encounter: Payer: Self-pay | Admitting: Family

## 2021-07-14 ENCOUNTER — Telehealth: Payer: Self-pay | Admitting: Family

## 2021-07-14 ENCOUNTER — Ambulatory Visit (INDEPENDENT_AMBULATORY_CARE_PROVIDER_SITE_OTHER): Payer: BC Managed Care – PPO | Admitting: Family

## 2021-07-14 ENCOUNTER — Other Ambulatory Visit: Payer: Self-pay

## 2021-07-14 VITALS — BP 120/80 | HR 90 | Temp 98.4°F | Ht 65.0 in | Wt 219.6 lb

## 2021-07-14 DIAGNOSIS — I10 Essential (primary) hypertension: Secondary | ICD-10-CM

## 2021-07-14 DIAGNOSIS — I8393 Asymptomatic varicose veins of bilateral lower extremities: Secondary | ICD-10-CM

## 2021-07-14 DIAGNOSIS — R41840 Attention and concentration deficit: Secondary | ICD-10-CM | POA: Insufficient documentation

## 2021-07-14 DIAGNOSIS — R0602 Shortness of breath: Secondary | ICD-10-CM

## 2021-07-14 LAB — TSH: TSH: 0.89 u[IU]/mL (ref 0.35–5.50)

## 2021-07-14 MED ORDER — HYDROCHLOROTHIAZIDE 25 MG PO TABS: 25.0000 mg | ORAL_TABLET | Freq: Every day | ORAL | 3 refills | Status: DC

## 2021-07-14 NOTE — Assessment & Plan Note (Addendum)
New. Onset correlates to COVID 19.  We discussed that agree that her health this past year has likely been very overwhelming in the setting of diagnosis of breast cancer a couple months after contracting COVID-19.  No overt depressive symptoms. Interaction with tamoxifen and wellbutrin and consulted with Dr Jeanell Sparrow, oncology. Advised patient. We will monitor. Close follow up.

## 2021-07-14 NOTE — Telephone Encounter (Signed)
Cat, RN from Dr Ray's office called back & stated that Wellbutrin was not reccommended due to it lessening the concentration of the Tamoxifen. Same antidepressants would be Lexapro, Effexor and Remeron. Two others due to low risk side effects that are not recommended are Celexa & Zoloft. I explained Wellbutrin was actually being prescribed for concentration, but this was good info for future reference.

## 2021-07-14 NOTE — Progress Notes (Signed)
Subjective:    Patient ID: [name], female    DOB: 1971/06/12, 50 y.o.   MRN: [id number]  CC: [name] is a 50 y.o. female who presents today for follow up.   HPI: Multiple complaints.  Patient here for follow-up she has not been seen in 2 years.  Hypertension-compliant with hydrochlorothiazide 25 mg.  Complains of being in 'fog' for almost 2 years, unchanged.   Onset 08/2019 after contracting covid 19. Breast cancer diagnosis of January 2021.  She describes trouble remembering events at first and then will recall with prompting. She will forget the name of medication such as tamoxifen. She hadnt gotten lost, forgotten loved ones name.  She is working in [employer] in [employer] from home. Reports very stressful. Decreased concentration.   She doesn't feel she is depressed. She is sleeping well. She doesn't feel particularly anxious.   She is holding tamoxifen for 2 weeks to see if helpful for memory.   No h/o seizure, anorexia,bulimia. No h/o alcohol abuse. She drinks 1-2 drinks socially.   B12 665, 07/05/21  Complains of left sided numbness only when laying in bed, started 'months ago', She doesn't recall initial onset however thinks may be related to radiation on left side which she started 03/2020. This is occasional and doesn't occur when sitting, walking.   Numbness occurs in left arm and left leg. No saddle anesthesia, or left thigh numbness,   She isnt sure if anxiety related.   She also complains of episodic waking up feeling ' she couldn't take a breath' almost that she would 'pass out'.  Started 3-4 months ago. Lasted 30 seconds or less. She was able to take deep breaths and calm down, symptom resolved. This has occurred 3 times in total. Associated with palpitations.   h/o OSA- no longer wearing Cipap after gastric bypass.  Feels restored the next day.  No unusual dreams.  Symptom not associated CP, diaphoresis, syncope, vision changes, HA.   She also complains  of bilateral calf varicosities which are painful. No leg swelling, pain in calves when walking, erythema.      TSH 3 months ago 1.130  Had been following with [name] , GYN, for menorrhagia suspected from tamoxifen, completed course of [name] and vaginal bleeding has stopped. Endometrial biopsy normal 02/23/21    She is following with [name] weight loss, she is considering bariatric surgery.  She is compliant with wegovy as prescribed by [name].   History of left partial mastectomy 01/24/2020.  Continue surveillance with [name] oncology, Shelby. Jeanell Jimenez. She follows with general surgeon, Shelby Kern Reap .  Followed by adjuvant radiation, adjuvant chemotherapy was deferred  [name] is prescribing tamoxifen, started 05/2020.   Continues to follow with radiation oncology, [name] with follow up 09/02/21 She has left breast diagnostic mammogram due September 2022   HISTORY:  Past Medical History:  Diagnosis Date   Blood in urine 2014   Breast mass 2013   BV (bacterial vaginosis)    Cancer [facility])    Breast Jan 2021   Edema    legs   Hypertension    Thyroid disease    Past Surgical History:  Procedure Laterality Date   BREAST BIOPSY Right 2013   benign - fatty tissue   BREAST BIOPSY Left 06-12-13   fibroadenoma /core biopsy   BREAST BIOPSY Right 06-17-13   fibrocystic /stereo biopsy   CESAREAN SECTION  2007   DILATION AND CURETTAGE OF UTERUS  FOOT SURGERY Left    01/2017   ROUX-EN-Y PROCEDURE  03/04/2014   [place]helby. [place]   TUBAL LIGATION     Family History  Problem Relation Age of Onset   Arthritis Mother    Diabetes Father    Hypertension Father    Polycystic ovary syndrome Sister    Hypothyroidism Sister    Cancer Maternal Grandmother 49       breast   Ovarian cancer Neg Hx    Colon cancer Neg Hx     Allergies: Tape, Avelox [moxifloxacin hcl in nacl], and Chlorhexidine Current Outpatient Medications on File Prior to Visit  Medication Sig Dispense Refill   Multiple  Vitamins-Iron (MULTIVITAMINS WITH IRON) TABS tablet Take 1 tablet by mouth daily.     Semaglutide-Weight Management (WEGOVY) 2.4 MG/0.75ML SOAJ Inject 2.4 mg into the skin once a week.     tamoxifen (NOLVADEX) 20 MG tablet Take 20 mg by mouth daily.     No current facility-administered medications on file prior to visit.    Social History   Tobacco Use   Smoking status: Former   Smokeless tobacco: Never   Tobacco comments:    quit 2006  Vaping Use   Vaping Use: Never used  Substance Use Topics   Alcohol use: Yes    Comment: rare   Drug use: No    Review of Systems  Constitutional:  Negative for chills, fever and unexpected weight change.  HENT:  Negative for congestion.   Eyes:  Negative for visual disturbance.  Respiratory:  Positive for shortness of breath. Negative for cough.   Cardiovascular:  Positive for leg swelling. Negative for chest pain and palpitations.  Gastrointestinal:  Negative for nausea and vomiting.  Musculoskeletal:  Negative for neck pain.  Skin:  Negative for rash.  Neurological:  Positive for numbness. Negative for headaches.  Hematological:  Negative for adenopathy.  Psychiatric/Behavioral:  Negative for confusion, sleep disturbance and suicidal ideas.      Objective:    BP 120/80 (BP Location: Left Arm, Patient Position: Sitting, Cuff Size: Large)   Pulse 90   Temp 98.4 F (36.9 C) (Oral)   Ht '5\' 5"'$  (1.651 m)   Wt 219 lb 9.6 oz (99.6 kg)   SpO2 97%   BMI 36.54 kg/m  BP Readings from Last 3 Encounters:  07/14/21 120/80  03/30/21 111/67  02/23/21 129/72   Wt Readings from Last 3 Encounters:  07/14/21 219 lb 9.6 oz (99.6 kg)  03/30/21 231 lb 8 oz (105 kg)  02/23/21 238 lb 6.4 oz (108.1 kg)    Physical Exam Vitals reviewed.  Constitutional:      Appearance: She is well-developed.  Eyes:     Conjunctiva/sclera: Conjunctivae normal.  Cardiovascular:     Rate and Rhythm: Normal rate and regular rhythm.     Pulses: Normal pulses.      Heart sounds: Normal heart sounds.     Comments: No LE edema, palpable cords or masses. No erythema or increased warmth. No asymmetry in calf size when compared bilaterally LE hair growth symmetric and present. No discoloration.  varicosities noted. LE warm and palpable pedal pulses.  Pulmonary:     Effort: Pulmonary effort is normal.     Breath sounds: Normal breath sounds. No wheezing, rhonchi or rales.  Chest:     Chest wall: No tenderness.  Musculoskeletal:     Comments: Negative SLR bilaterally.  Sensation intact bilateral lower extremities.  Strength 5/5 bilateral lower extremities.   Skin:  General: Skin is warm and dry.  Neurological:     Mental Status: She is alert.  Psychiatric:        Speech: Speech normal.        Behavior: Behavior normal.        Thought Content: Thought content normal.       Assessment & Plan:   Problem List Items Addressed This Visit       Cardiovascular and Mediastinum   Hypertension - Primary    Excellent control, continue hydrochlorothiazide 25 mg.       Relevant Medications   hydrochlorothiazide (HYDRODIURIL) 25 MG tablet   Varicosities of leg    Chronic. Painful. Referral to vascular placed.        Relevant Medications   hydrochlorothiazide (HYDRODIURIL) 25 MG tablet   Other Relevant Orders   Ambulatory referral to Vascular Surgery     Other   Concentration deficit    New. Onset correlates to COVID 19.  We discussed that agree that her health this past year has likely been very overwhelming in the setting of diagnosis of breast cancer a couple months after contracting COVID-19.  No overt depressive symptoms. Interaction with tamoxifen and wellbutrin and consulted with [name], oncology. Advised patient. We will monitor. Close follow up.        Relevant Orders   TSH (Completed)   SOB (shortness of breath)    Episodic, no shortness of breath today.  No acute respiratory distress.  No adventitious lung sounds.  EKG shows  NSR.  No acute changes when compared to prior EKG done September 04, 2019.  Etiology nonspecific at this time.  Referral to cardiology for evaluation of cardiomyopathy after left breast radiation.   We also discussed if episodes related to anxiety.  We jointly agreed to defer starting any anxiety medication at this time due to infrequent episodes. Will discuss at follow-up.  We also discussed her history of sleep apnea.  Question whether this could be an apneic episode.  We may pursue reevaluation for OSA with pulmonology at follow-up.       Relevant Orders   EKG 12-Lead (Completed)   Ambulatory referral to Cardiology     I have discontinued Billiejo Henrich. Stemen's tranexamic acid. I am also having her maintain her tamoxifen, multivitamins with iron, Wegovy, and hydrochlorothiazide.   Meds ordered this encounter  Medications   hydrochlorothiazide (HYDRODIURIL) 25 MG tablet    Sig: Take 1 tablet (25 mg total) by mouth daily.    Dispense:  90 tablet    Refill:  3    Order Specific Question:   Supervising Provider    Answer:   Crecencio Mc [[id number]]     Return precautions given.   Risks, benefits, and alternatives of the medications and treatment plan prescribed today were discussed, and patient expressed understanding.   Education regarding symptom management and diagnosis given to patient on AVS.  Continue to follow with Burnard Hawthorne, FNP for routine health maintenance.   [name] and I agreed with plan.   Mable Paris, FNP  I have spent 35 minutes with a patient including precharting, exam, reviewing medical records, and discussion plan of care.

## 2021-07-14 NOTE — Assessment & Plan Note (Signed)
Excellent control, continue hydrochlorothiazide 25 mg.

## 2021-07-14 NOTE — Patient Instructions (Addendum)
We are going to call Dr. Jeanell Sparrow, oncology as it relates to starting Wellbutrin.  If she is okay with starting Wellbutrin, we will send in medication due to his interaction with tamoxifen.   We discussed today starting medication called Wellbutrin.  As also discussed, you must limit alcohol on this medication as alcohol and Wellbutrin together may increase your risk for seizure.  You may drink no more than 1 alcoholic beverage on this medication.  A standard drink is 12 ounces of regular beer, which is usually about 5% alcohol OR 5 ounces of wine, which is typically about 12% alcohol OR   1.5 ounces of distilled spirits, which is about 40% alcohol   Referral to cardiology and vascular  Let us know if you dont hear back within a week in regards to an appointment being scheduled.

## 2021-07-14 NOTE — Assessment & Plan Note (Signed)
Chronic. Painful. Referral to vascular placed.

## 2021-07-14 NOTE — Telephone Encounter (Signed)
Call pt Please advise her  Dr. Maretta Bees office advised against Wellbutrin which I can understand as it does interact with tamoxifen.  As were using Wellbutrin not for depression but more for decreased concentration, I do not really have another viable option for concentration.  If she has had longstanding concerns of ADHD, we could refer her for formal testing.  I cannot prescribe anything like Adderall without a formal diagnosis.  Please let me know her  thoughts.

## 2021-07-14 NOTE — Assessment & Plan Note (Addendum)
Episodic, no shortness of breath today.  No acute respiratory distress.  No adventitious lung sounds.  EKG shows NSR.  No acute changes when compared to prior EKG done September 04, 2019.  Etiology nonspecific at this time.  Referral to cardiology for evaluation of cardiomyopathy after left breast radiation.   We also discussed if episodes related to anxiety.  We jointly agreed to defer starting any anxiety medication at this time due to infrequent episodes. Will discuss at follow-up.  We also discussed her history of sleep apnea.  Question whether this could be an apneic episode.  We may pursue reevaluation for OSA with pulmonology at follow-up.

## 2021-07-14 NOTE — Telephone Encounter (Signed)
[facility] Nurse Triage from Dr. Maretta Bees office called back & she will consult their Pharm D at clinic to see if any drug interaction. She will back to let us officially know if okay to prescribe for patient.

## 2021-07-14 NOTE — Telephone Encounter (Signed)
Call patient's [facility] oncologist:  Alvis Lemmings, MD   [address]   [id number], [place] [address] [place], [place] [address]   [phone] (Work)    We discussed starting wellbutrin '150mg'$  XL for decreased concentration  Concerned with interaction with tamoxifen.  Is she okay with low dose?

## 2021-07-15 NOTE — Telephone Encounter (Signed)
I explained the reasoning why she could not take the Wellbutrin & Tamoxifen together. I called patient & she would like to be formally tested. She stated that her daughter at one point was going to [facility] in [place] & she prefers to not be tested there. She is not found of that office. She would like to be referred elsewhere & I advised there was a place in [place], but I couldn't remember the name. She ws fine with going to [facility]. I told her once referral is placed she should hear in 7-10 days in regards to an appointment & to let us know if she does not.

## 2021-07-16 NOTE — Telephone Encounter (Signed)
Ref placed.

## 2021-07-26 ENCOUNTER — Ambulatory Visit (INDEPENDENT_AMBULATORY_CARE_PROVIDER_SITE_OTHER): Payer: BC Managed Care – PPO | Admitting: Cardiology

## 2021-07-26 ENCOUNTER — Other Ambulatory Visit: Payer: Self-pay

## 2021-07-26 ENCOUNTER — Encounter: Payer: Self-pay | Admitting: Cardiology

## 2021-07-26 VITALS — BP 108/68 | HR 88 | Ht 65.0 in | Wt 220.4 lb

## 2021-07-26 DIAGNOSIS — R002 Palpitations: Secondary | ICD-10-CM | POA: Diagnosis not present

## 2021-07-26 DIAGNOSIS — I8393 Asymptomatic varicose veins of bilateral lower extremities: Secondary | ICD-10-CM

## 2021-07-26 DIAGNOSIS — R5383 Other fatigue: Secondary | ICD-10-CM | POA: Diagnosis not present

## 2021-07-26 DIAGNOSIS — R0600 Dyspnea, unspecified: Secondary | ICD-10-CM

## 2021-07-26 NOTE — Progress Notes (Signed)
Cardiology Office Note:    Date:  07/26/2021   ID:  [name], DOB 1971/09/05, MRN [id number]  PCP:  Burnard Hawthorne, FNP   [facility] Providers Cardiologist:  None     Referring MD: Burnard Hawthorne, FNP   Chief Complaint  Patient presents with   Other    SOB c/o waking up in the middle of sleep unable to breathe and pounding heart beat. Meds reviewed verbally with pt.   [name] is a 50 y.o. female who is being seen today for the evaluation of shortness of breath at the request of Vidal Schwalbe Yvetta Coder, FNP.   History of Present Illness:    [name] is a 50 y.o. female with a hx of obesity, breast cancer s/p radiation therapy 2021, varicose veins, former smoker x12 years who presents due to shortness of breath.  Patient states having symptoms of difficulty breathing usually in her sleep.  This has woken her up about 4 times over the past 2 months.  She denies shortness of breath with ordinary activity.  Used to snore, but lost 200 pounds over the last 5 years with improvement in snoring.  Endorses daytime somnolence and fatigue.  Has history of varicose veins and lower extremity edema, takes HCTZ for this with significant improvement.  She noticed 1 episode of palpitations lasting a few seconds 3 days ago.  Has not had any recurrence.  Past Medical History:  Diagnosis Date   Blood in urine 2014   Breast mass 2013   BV (bacterial vaginosis)    Cancer [facility])    Breast Jan 2021   Edema    legs   Hypertension    Thyroid disease     Past Surgical History:  Procedure Laterality Date   BREAST BIOPSY Right 2013   benign - fatty tissue   BREAST BIOPSY Left 06-12-13   fibroadenoma /core biopsy   BREAST BIOPSY Right 06-17-13   fibrocystic /stereo biopsy   CESAREAN SECTION  2007   DILATION AND CURETTAGE OF UTERUS     FOOT SURGERY Left    01/2017   ROUX-EN-Y PROCEDURE  03/04/2014   Dr. [place]   TUBAL LIGATION      Current Medications: Current Meds   Medication Sig   hydrochlorothiazide (HYDRODIURIL) 25 MG tablet Take 1 tablet (25 mg total) by mouth daily.   Multiple Vitamins-Iron (MULTIVITAMINS WITH IRON) TABS tablet Take 1 tablet by mouth daily.   Semaglutide-Weight Management (WEGOVY) 2.4 MG/0.75ML SOAJ Inject 2.4 mg into the skin once a week.   tamoxifen (NOLVADEX) 20 MG tablet Take 20 mg by mouth daily.     Allergies:   Tape, Avelox [moxifloxacin hcl in nacl], and Chlorhexidine   Social History   Socioeconomic History   Marital status: Married    Spouse name: Not on file   Number of children: Not on file   Years of education: Not on file   Highest education level: Not on file  Occupational History   Not on file  Tobacco Use   Smoking status: Former   Smokeless tobacco: Never   Tobacco comments:    quit 2006  Vaping Use   Vaping Use: Never used  Substance and Sexual Activity   Alcohol use: Yes    Comment: rare   Drug use: No   Sexual activity: Yes    Birth control/protection: Surgical  Other Topics Concern   Not on file  Social History Narrative   Married.  Two children- daughter and son. Daughter at [employer] and thinking about medical profession.       Social Determinants of Health   Financial Resource Strain: Not on file  Food Insecurity: Not on file  Transportation Needs: Not on file  Physical Activity: Not on file  Stress: Not on file  Social Connections: Not on file     Family History: The patient's family history includes Arthritis in her mother; Cancer (age of onset: 72) in her maternal grandmother; Diabetes in her father; Hypertension in her father; Hypothyroidism in her sister; Polycystic ovary syndrome in her sister. There is no history of Ovarian cancer or Colon cancer.  ROS:   Please see the history of present illness.     All other systems reviewed and are negative.  EKGs/Labs/Other Studies Reviewed:    The following studies were reviewed today:   EKG:  EKG is  ordered today.  The ekg  ordered today demonstrates sinus rhythm  Recent Labs: 04/07/2021: BUN 12; Creatinine, Ser 0.66; Hemoglobin 12.1; Platelets 237; Potassium 4.0; Sodium 143 07/14/2021: TSH 0.89  Recent Lipid Panel    Component Value Date/Time   CHOL 129 04/07/2021 0847   TRIG 91 04/07/2021 0847   HDL 48 04/07/2021 0847   CHOLHDL 2.7 04/07/2021 0847   CHOLHDL 3 12/24/2019 0905   VLDL 22.6 12/24/2019 0905   LDLCALC 64 04/07/2021 0847     Risk Assessment/Calculations:          Physical Exam:    VS:  BP 108/68 (BP Location: Right Arm, Patient Position: Sitting, Cuff Size: Large)   Pulse 88   Ht '5\' 5"'$  (1.651 m)   Wt 220 lb 6 oz (100 kg)   SpO2 99%   BMI 36.67 kg/m     Wt Readings from Last 3 Encounters:  07/26/21 220 lb 6 oz (100 kg)  07/14/21 219 lb 9.6 oz (99.6 kg)  03/30/21 231 lb 8 oz (105 kg)     GEN:  Well nourished, well developed in no acute distress HEENT: Normal NECK: No JVD; No carotid bruits LYMPHATICS: No lymphadenopathy CARDIAC: RRR, no murmurs, rubs, gallops RESPIRATORY:  Clear to auscultation without rales, wheezing or rhonchi  ABDOMEN: Soft, non-tender, non-distended MUSCULOSKELETAL: Trace edema, varicose veins noted SKIN: Warm and dry NEUROLOGIC:  Alert and oriented x 3 PSYCHIATRIC:  Normal affect   ASSESSMENT:    1. Dyspnea, unspecified type   2. Palpitation   3. Fatigue, unspecified type   4. Varicose veins of both lower extremities, unspecified whether complicated    PLAN:    In order of problems listed above:  Shortness of breath, obesity could be contributing, underlying sleep apnea also possible.  Get echocardiogram.  Symptoms are not consistent with angina or anginal equivalent. 1 episode of palpitation 3 days ago, no recurrence since.  Continue to monitor.  If symptoms persist, will consider cardiac monitor. Daytime fatigue, somnolence, obese.  Refer to pulmonary medicine for OSA evaluation Varicose veins, leg raising, compression stocking advised.   Currently controlled with HCTZ.  Follow-up after echocardiogram.     Medication Adjustments/Labs and Tests Ordered: Current medicines are reviewed at length with the patient today.  Concerns regarding medicines are outlined above.  Orders Placed This Encounter  Procedures   Ambulatory referral to Pulmonology   EKG 12-Lead   ECHOCARDIOGRAM COMPLETE    No orders of the defined types were placed in this encounter.   Patient Instructions  Medication Instructions:   Your physician recommends that you continue on  your current medications as directed. Please refer to the Current Medication list given to you today.  *If you need a refill on your cardiac medications before your next appointment, please call your pharmacy*   Lab Work: None ordered If you have labs (blood work) drawn today and your tests are completely normal, you will receive your results only by: [place] (if you have MyChart) OR A paper copy in the mail If you have any lab test that is abnormal or we need to change your treatment, we will call you to review the results.   Testing/Procedures:  Your physician has requested that you have an echocardiogram. Echocardiography is a painless test that uses sound waves to create images of your heart. It provides your doctor with information about the size and shape of your heart and how well your heart's chambers and valves are working. This procedure takes approximately one hour. There are no restrictions for this procedure.    Follow-Up: At [facility], you and your health needs are our priority.  As part of our continuing mission to provide you with exceptional heart care, we have created designated Provider Care Teams.  These Care Teams include your primary Cardiologist (physician) and Advanced Practice Providers (APPs -  Physician Assistants and Nurse Practitioners) who all work together to provide you with the care you need, when you need it.  We recommend  signing up for the patient portal called "MyChart".  Sign up information is provided on this After Visit Summary.  MyChart is used to connect with patients for Virtual Visits (Telemedicine).  Patients are able to view lab/test results, encounter notes, upcoming appointments, etc.  Non-urgent messages can be sent to your provider as well.   To learn more about what you can do with MyChart, go to [email or web address].    Your next appointment:   Follow up after Echo   The format for your next appointment:   In Person  Provider:   You may see Dr. Garen Lah or one of the following Advanced Practice Providers on your designated Care Team:   Murray Hodgkins, NP Christell Faith, PA-C Marrianne Mood, PA-C Cadence [place], [place]   Other Instructions    Signed, Kate Sable, MD  07/26/2021 4:40 PM    [place]

## 2021-07-26 NOTE — Patient Instructions (Signed)
Medication Instructions:  Your physician recommends that you continue on your current medications as directed. Please refer to the Current Medication list given to you today.  *If you need a refill on your cardiac medications before your next appointment, please call your pharmacy*   Lab Work: None ordered If you have labs (blood work) drawn today and your tests are completely normal, you will receive your results only by: MyChart Message (if you have MyChart) OR A paper copy in the mail If you have any lab test that is abnormal or we need to change your treatment, we will call you to review the results.   Testing/Procedures:  Your physician has requested that you have an echocardiogram. Echocardiography is a painless test that uses sound waves to create images of your heart. It provides your doctor with information about the size and shape of your heart and how well your heart's chambers and valves are working. This procedure takes approximately one hour. There are no restrictions for this procedure.    Follow-Up: At [facility], you and your health needs are our priority.  As part of our continuing mission to provide you with exceptional heart care, we have created designated Provider Care Teams.  These Care Teams include your primary Cardiologist (physician) and Advanced Practice Providers (APPs -  Physician Assistants and Nurse Practitioners) who all work together to provide you with the care you need, when you need it.  We recommend signing up for the patient portal called "MyChart".  Sign up information is provided on this After Visit Summary.  MyChart is used to connect with patients for Virtual Visits (Telemedicine).  Patients are able to view lab/test results, encounter notes, upcoming appointments, etc.  Non-urgent messages can be sent to your provider as well.   To learn more about what you can do with MyChart, go to [email or web address].    Your next appointment:   Follow  up after Echo   The format for your next appointment:   In Person  Provider:   You may see Dr. Agbor-Etang or one of the following Advanced Practice Providers on your designated Care Team:   Christopher Berge, NP Ryan Dunn, PA-C Jacquelyn Visser, PA-C Cadence [place], PA-C   Other Instructions   

## 2021-08-24 ENCOUNTER — Other Ambulatory Visit (INDEPENDENT_AMBULATORY_CARE_PROVIDER_SITE_OTHER): Payer: Self-pay | Admitting: Nurse Practitioner

## 2021-08-24 DIAGNOSIS — I83813 Varicose veins of bilateral lower extremities with pain: Secondary | ICD-10-CM

## 2021-08-25 ENCOUNTER — Encounter (INDEPENDENT_AMBULATORY_CARE_PROVIDER_SITE_OTHER): Payer: Self-pay

## 2021-08-25 ENCOUNTER — Encounter (INDEPENDENT_AMBULATORY_CARE_PROVIDER_SITE_OTHER): Payer: Self-pay | Admitting: Nurse Practitioner

## 2021-08-26 ENCOUNTER — Encounter (INDEPENDENT_AMBULATORY_CARE_PROVIDER_SITE_OTHER): Payer: Self-pay | Admitting: Vascular Surgery

## 2021-08-26 ENCOUNTER — Ambulatory Visit (INDEPENDENT_AMBULATORY_CARE_PROVIDER_SITE_OTHER): Payer: BC Managed Care – PPO | Admitting: Vascular Surgery

## 2021-08-26 ENCOUNTER — Ambulatory Visit (INDEPENDENT_AMBULATORY_CARE_PROVIDER_SITE_OTHER): Payer: BC Managed Care – PPO

## 2021-08-26 ENCOUNTER — Other Ambulatory Visit: Payer: Self-pay

## 2021-08-26 VITALS — BP 104/69 | HR 63 | Ht 65.0 in

## 2021-08-26 DIAGNOSIS — I83819 Varicose veins of unspecified lower extremities with pain: Secondary | ICD-10-CM

## 2021-08-26 DIAGNOSIS — I83813 Varicose veins of bilateral lower extremities with pain: Secondary | ICD-10-CM | POA: Diagnosis not present

## 2021-08-26 DIAGNOSIS — I10 Essential (primary) hypertension: Secondary | ICD-10-CM

## 2021-08-26 DIAGNOSIS — M7989 Other specified soft tissue disorders: Secondary | ICD-10-CM | POA: Diagnosis not present

## 2021-08-26 NOTE — Progress Notes (Signed)
MRN : [id number]  [name] is a 50 y.o. (04-Nov-1971) female who presents with chief complaint of painful varicose.  History of Present Illness:   The patient is seen for evaluation of symptomatic varicose veins. The patient relates burning and stinging which worsened steadily throughout the course of the day, particularly with standing. The patient also notes an aching and throbbing pain over the varicosities, particularly with prolonged dependent positions. The symptoms are significantly improved with elevation.  The patient also notes that during hot weather the symptoms are greatly intensified. The patient states the pain from the varicose veins interferes with work, daily exercise, shopping and household maintenance. At this point, the symptoms are persistent and severe enough that they're having a negative impact on lifestyle and are interfering with daily activities.  There is no history of DVT, PE or superficial thrombophlebitis. There is no history of ulceration or hemorrhage. The patient denies a significant family history of varicose veins.  The patient has worn graduated compression on a daily basis for years but the veins and her symptoms have increased with time. At the present time the patient has been using over-the-counter analgesics. She has also been exercising.  There is no history of prior surgical intervention or sclerotherapy.   Duplex ultrasound shows norma deep venous system with severe reflux in the left GSV.  No outpatient medications have been marked as taking for the 08/26/21 encounter (Appointment) with Delana Meyer, Dolores Lory, MD.    Past Medical History:  Diagnosis Date   Blood in urine 2014   Breast mass 2013   BV (bacterial vaginosis)    Cancer ([place])    Breast Jan 2021   Edema    legs   Hypertension    Thyroid disease     Past Surgical History:  Procedure Laterality Date   BREAST BIOPSY Right 2013   benign - fatty tissue   BREAST BIOPSY Left  06-12-13   fibroadenoma /core biopsy   BREAST BIOPSY Right 06-17-13   fibrocystic /stereo biopsy   CESAREAN SECTION  2007   DILATION AND CURETTAGE OF UTERUS     FOOT SURGERY Left    01/2017   ROUX-EN-Y PROCEDURE  03/04/2014   Dr. [place]   TUBAL LIGATION      Social History Social History   Tobacco Use   Smoking status: Former   Smokeless tobacco: Never   Tobacco comments:    quit 2006  Vaping Use   Vaping Use: Never used  Substance Use Topics   Alcohol use: Yes    Comment: rare   Drug use: No    Family History Family History  Problem Relation Age of Onset   Arthritis Mother    Diabetes Father    Hypertension Father    Polycystic ovary syndrome Sister    Hypothyroidism Sister    Cancer Maternal Grandmother 11       breast   Ovarian cancer Neg Hx    Colon cancer Neg Hx     Allergies  Allergen Reactions   Tape Other (See Comments)    Blistering, ok with paper tape   Avelox [Moxifloxacin Hcl In Nacl] Palpitations   Chlorhexidine Itching    Chg wipes cause severe itching     REVIEW OF SYSTEMS (Negative unless checked)  Constitutional: '[]'$ Weight loss  '[]'$ Fever  '[]'$ Chills Cardiac: '[]'$ Chest pain   '[]'$ Chest pressure   '[]'$ Palpitations   '[]'$ Shortness of breath when laying flat   '[]'$ Shortness of breath with exertion. Vascular:  '[]'$   Pain in legs with walking   '[x]'$ Pain in legs at rest  '[]'$ History of DVT   '[]'$ Phlebitis   '[x]'$ Swelling in legs   '[x]'$ Varicose veins   '[]'$ Non-healing ulcers Pulmonary:   '[]'$ Uses home oxygen   '[]'$ Productive cough   '[]'$ Hemoptysis   '[]'$ Wheeze  '[]'$ COPD   '[]'$ Asthma Neurologic:  '[]'$ Dizziness   '[]'$ Seizures   '[]'$ History of stroke   '[]'$ History of TIA  '[]'$ Aphasia   '[]'$ Vissual changes   '[]'$ Weakness or numbness in arm   '[]'$ Weakness or numbness in leg Musculoskeletal:   '[]'$ Joint swelling   '[]'$ Joint pain   '[]'$ Low back pain Hematologic:  '[]'$ Easy bruising  '[]'$ Easy bleeding   '[]'$ Hypercoagulable state   '[]'$ Anemic Gastrointestinal:  '[]'$ Diarrhea   '[]'$ Vomiting  '[]'$ Gastroesophageal reflux/heartburn    '[]'$ Difficulty swallowing. Genitourinary:  '[]'$ Chronic kidney disease   '[]'$ Difficult urination  '[]'$ Frequent urination   '[]'$ Blood in urine Skin:  '[]'$ Rashes   '[]'$ Ulcers  Psychological:  '[]'$ History of anxiety   '[]'$  History of major depression.  Physical Examination  There were no vitals filed for this visit. There is no height or weight on file to calculate BMI. Gen: WD/WN, NAD Head: [place]/AT, No temporalis wasting.  Ear/Nose/Throat: Hearing grossly intact, nares w/o erythema or drainage, pinna without lesions Eyes: PER, EOMI, sclera nonicteric.  Neck: Supple, no gross masses.  No JVD.  Pulmonary:  Good air movement, no audible wheezing, no use of accessory muscles.  Cardiac: RRR, precordium not hyperdynamic. Vascular:  large varicosities present left greater than right.  Mild venous stasis changes to the legs bilaterally.  2+ soft pitting edema  Vessel Right Left  Radial Palpable Palpable  Gastrointestinal: soft, non-distended. No guarding/no peritoneal signs.  Musculoskeletal: M/S 5/5 throughout.  No deformity.  Neurologic: CN 2-12 intact. Pain and light touch intact in extremities.  Symmetrical.  Speech is fluent. Motor exam as listed above. Psychiatric: Judgment intact, Mood & affect appropriate for pt's clinical situation. Dermatologic: Venous rashes no ulcers noted.  No changes consistent with cellulitis. Lymph : No lichenification or skin changes of chronic lymphedema.  CBC Lab Results  Component Value Date   WBC 6.7 04/07/2021   HGB 12.1 04/07/2021   HCT 37.2 04/07/2021   MCV 90 04/07/2021   PLT 237 04/07/2021    BMET    Component Value Date/Time   NA 143 04/07/2021 0847   K 4.0 04/07/2021 0847   CL 105 04/07/2021 0847   CO2 24 04/07/2021 0847   GLUCOSE 87 04/07/2021 0847   GLUCOSE 91 12/24/2019 0905   BUN 12 04/07/2021 0847   CREATININE 0.66 04/07/2021 0847   CALCIUM 8.8 04/07/2021 0847   GFRNONAA >60 09/04/2019 2159   GFRAA >60 09/04/2019 2159   CrCl cannot be calculated  (Patient's most recent lab result is older than the maximum 21 days allowed.).  COAG No results found for: INR, PROTIME  Radiology No results found.   Assessment/Plan 1. Varicose veins with pain Recommend  I have reviewed my previous  discussion with the patient regarding  varicose veins and why they cause symptoms. Patient will continue  wearing graduated compression stockings class 1 on a daily basis, beginning first thing in the morning and removing them in the evening.    In addition, behavioral modification including elevation during the day was again discussed and this will continue.  The patient has utilized over the counter pain medications and has been exercising.  However, at this time conservative therapy has not alleviated the patient's symptoms of leg pain and swelling  Recommend: laser ablation  of the left great saphenous veins to eliminate the symptoms of pain and swelling of the lower extremities caused by the severe superficial venous reflux disease.    2. Primary hypertension Continue antihypertensive medications as already ordered, these medications have been reviewed and there are no changes at this time.   3. Leg swelling No surgery or intervention at this point in time.  I have reviewed my discussion with the patient regarding venous insufficiency and why it causes symptoms. I have discussed with the patient the chronic skin changes that accompany venous insufficiency and the long term sequela such as ulceration. Patient will contnue wearing graduated compression stockings on a daily basis, as this has provided excellent control of his edema. The patient will put the stockings on first thing in the morning and removing them in the evening. The patient is reminded not to sleep in the stockings.  In addition, behavioral modification including elevation during the day will be initiated. Exercise is strongly encouraged.    Hortencia Pilar, MD  08/26/2021 1:00  PM

## 2021-08-29 ENCOUNTER — Encounter (INDEPENDENT_AMBULATORY_CARE_PROVIDER_SITE_OTHER): Payer: Self-pay | Admitting: Vascular Surgery

## 2021-08-31 ENCOUNTER — Ambulatory Visit (INDEPENDENT_AMBULATORY_CARE_PROVIDER_SITE_OTHER): Payer: BC Managed Care – PPO

## 2021-08-31 ENCOUNTER — Other Ambulatory Visit: Payer: Self-pay

## 2021-08-31 DIAGNOSIS — R002 Palpitations: Secondary | ICD-10-CM | POA: Diagnosis not present

## 2021-08-31 LAB — ECHOCARDIOGRAM COMPLETE
AR max vel: 3.07 cm2
AV Area VTI: 3.41 cm2
AV Area mean vel: 3.08 cm2
AV Mean grad: 4 mmHg
AV Peak grad: 8.5 mmHg
Ao pk vel: 1.46 m/s
Area-P 1/2: 2.73 cm2
Calc EF: 66.6 %
S' Lateral: 3.5 cm
Single Plane A2C EF: 66.1 %
Single Plane A4C EF: 66.4 %

## 2021-09-07 DIAGNOSIS — Z08 Encounter for follow-up examination after completed treatment for malignant neoplasm: Secondary | ICD-10-CM | POA: Diagnosis not present

## 2021-09-07 DIAGNOSIS — N939 Abnormal uterine and vaginal bleeding, unspecified: Secondary | ICD-10-CM | POA: Diagnosis not present

## 2021-09-07 DIAGNOSIS — Z79810 Long term (current) use of selective estrogen receptor modulators (SERMs): Secondary | ICD-10-CM | POA: Diagnosis not present

## 2021-09-07 DIAGNOSIS — C50912 Malignant neoplasm of unspecified site of left female breast: Secondary | ICD-10-CM | POA: Diagnosis not present

## 2021-09-07 DIAGNOSIS — Z853 Personal history of malignant neoplasm of breast: Secondary | ICD-10-CM | POA: Diagnosis not present

## 2021-09-07 DIAGNOSIS — Z9012 Acquired absence of left breast and nipple: Secondary | ICD-10-CM | POA: Diagnosis not present

## 2021-09-07 DIAGNOSIS — Z6835 Body mass index (BMI) 35.0-35.9, adult: Secondary | ICD-10-CM | POA: Diagnosis not present

## 2021-09-07 DIAGNOSIS — Z170 Estrogen receptor positive status [ER+]: Secondary | ICD-10-CM | POA: Diagnosis not present

## 2021-09-07 DIAGNOSIS — C50812 Malignant neoplasm of overlapping sites of left female breast: Secondary | ICD-10-CM | POA: Diagnosis not present

## 2021-09-10 ENCOUNTER — Other Ambulatory Visit: Payer: Self-pay

## 2021-09-10 ENCOUNTER — Encounter: Payer: Self-pay | Admitting: Cardiology

## 2021-09-10 ENCOUNTER — Ambulatory Visit (INDEPENDENT_AMBULATORY_CARE_PROVIDER_SITE_OTHER): Payer: BC Managed Care – PPO | Admitting: Cardiology

## 2021-09-10 VITALS — BP 110/64 | HR 80 | Ht 65.0 in | Wt 215.0 lb

## 2021-09-10 DIAGNOSIS — I8393 Asymptomatic varicose veins of bilateral lower extremities: Secondary | ICD-10-CM | POA: Diagnosis not present

## 2021-09-10 DIAGNOSIS — R0600 Dyspnea, unspecified: Secondary | ICD-10-CM | POA: Diagnosis not present

## 2021-09-10 DIAGNOSIS — Z6835 Body mass index (BMI) 35.0-35.9, adult: Secondary | ICD-10-CM | POA: Diagnosis not present

## 2021-09-10 NOTE — Progress Notes (Signed)
Cardiology Office Note:    Date:  09/10/2021   ID:  [name], DOB February 08, 1971, MRN [id number]  PCP:  Burnard Hawthorne, FNP   [facility] Providers Cardiologist:  None     Referring MD: Burnard Hawthorne, FNP   Chief Complaint  Patient presents with   Other    Follow up post ECHO. Meds reviewed verbally with patient.      History of Present Illness:    [name] is a 50 y.o. female with a hx of obesity, breast cancer s/p radiation therapy 2021, varicose veins, former smoker x12 years who presents for follow-up.  She was last seen due to shortness of breath.  Echocardiogram was ordered to evaluate any significant structural abnormalities.  She has been exercising more often, eating healthier, also losing some weight.  She states feeling much better with exercising and working on weight loss.  She was recommended to follow-up with pulmonary medicine for possible OSA eval, she would like to hold off on this for now as she is improving clinically with exercising and weight loss.  She otherwise feels well, has no concerns at this time.   Past Medical History:  Diagnosis Date   Blood in urine 2014   Breast mass 2013   BV (bacterial vaginosis)    Cancer [facility])    Breast Jan 2021   Edema    legs   Hypertension    Thyroid disease     Past Surgical History:  Procedure Laterality Date   BREAST BIOPSY Right 2013   benign - fatty tissue   BREAST BIOPSY Left 06-12-13   fibroadenoma /core biopsy   BREAST BIOPSY Right 06-17-13   fibrocystic /stereo biopsy   CESAREAN SECTION  2007   DILATION AND CURETTAGE OF UTERUS     FOOT SURGERY Left    01/2017   ROUX-EN-Y PROCEDURE  03/04/2014   Dr. [place]   TUBAL LIGATION      Current Medications: Current Meds  Medication Sig   hydrochlorothiazide (HYDRODIURIL) 25 MG tablet Take 1 tablet (25 mg total) by mouth daily.   Multiple Vitamins-Iron (MULTIVITAMINS WITH IRON) TABS tablet Take 1 tablet by mouth daily.    Semaglutide-Weight Management (WEGOVY) 2.4 MG/0.75ML SOAJ Inject 2.4 mg into the skin once a week.   tamoxifen (NOLVADEX) 20 MG tablet Take 20 mg by mouth daily.   valACYclovir (VALTREX) 500 MG tablet Take 500 mg by mouth 2 (two) times daily.     Allergies:   Tape, Avelox [moxifloxacin hcl in nacl], and Chlorhexidine   Social History   Socioeconomic History   Marital status: Married    Spouse name: Not on file   Number of children: Not on file   Years of education: Not on file   Highest education level: Not on file  Occupational History   Not on file  Tobacco Use   Smoking status: Former   Smokeless tobacco: Never   Tobacco comments:    quit 2006  Vaping Use   Vaping Use: Never used  Substance and Sexual Activity   Alcohol use: Yes    Comment: rare   Drug use: No   Sexual activity: Yes    Birth control/protection: Surgical  Other Topics Concern   Not on file  Social History Narrative   Married.   Two children- daughter and son. Daughter at [employer] and thinking about medical profession.       Social Determinants of Health   Financial Resource Strain: Not on  file  Food Insecurity: Not on file  Transportation Needs: Not on file  Physical Activity: Not on file  Stress: Not on file  Social Connections: Not on file     Family History: The patient's family history includes Arthritis in her mother; Cancer (age of onset: 69) in her maternal grandmother; Diabetes in her father; Hypertension in her father; Hypothyroidism in her sister; Polycystic ovary syndrome in her sister. There is no history of Ovarian cancer or Colon cancer.  ROS:   Please see the history of present illness.     All other systems reviewed and are negative.  EKGs/Labs/Other Studies Reviewed:    The following studies were reviewed today:   EKG:  EKG not ordered today.   Recent Labs: 04/07/2021: BUN 12; Creatinine, Ser 0.66; Hemoglobin 12.1; Platelets 237; Potassium 4.0; Sodium 143 07/14/2021: TSH  0.89  Recent Lipid Panel    Component Value Date/Time   CHOL 129 04/07/2021 0847   TRIG 91 04/07/2021 0847   HDL 48 04/07/2021 0847   CHOLHDL 2.7 04/07/2021 0847   CHOLHDL 3 12/24/2019 0905   VLDL 22.6 12/24/2019 0905   LDLCALC 64 04/07/2021 0847     Risk Assessment/Calculations:          Physical Exam:    VS:  BP 110/64 (BP Location: Left Arm, Patient Position: Sitting, Cuff Size: Normal)   Pulse 80   Ht 5\' 5"  (1.651 m)   Wt 215 lb (97.5 kg)   SpO2 98%   BMI 35.78 kg/m     Wt Readings from Last 3 Encounters:  09/10/21 215 lb (97.5 kg)  07/26/21 220 lb 6 oz (100 kg)  07/14/21 219 lb 9.6 oz (99.6 kg)     GEN:  Well nourished, well developed in no acute distress HEENT: Normal NECK: No JVD; No carotid bruits LYMPHATICS: No lymphadenopathy CARDIAC: RRR, no murmurs, rubs, gallops RESPIRATORY:  Clear to auscultation without rales, wheezing or rhonchi  ABDOMEN: Soft, non-tender, non-distended MUSCULOSKELETAL: Trace edema, varicose veins noted SKIN: Warm and dry NEUROLOGIC:  Alert and oriented x 3 PSYCHIATRIC:  Normal affect   ASSESSMENT:    1. Dyspnea, unspecified type   2. BMI 35.0-35.9,adult   3. Varicose veins of both lower extremities, unspecified whether complicated     PLAN:    In order of problems listed above:  Shortness of breath, echocardiogram shows normal systolic and diastolic function.  Obesity could be contributing, underlying sleep apnea also possible.  Patient made aware of results, reassured.  Referral to pulmonary medicine recommended, she would like to hold off for now since her symptoms are improving with exercising and weight loss.  If symptoms persist, she will consider pulmonary medicine.  Had 1 episode of chest discomfort, has not happened since.  If symptoms become more persistent, may consider stress testing. Obesity, weight loss exercise advised. Varicose veins, leg raising, compression stocking advised. controlled with  HCTZ.  Follow-up as needed     Medication Adjustments/Labs and Tests Ordered: Current medicines are reviewed at length with the patient today.  Concerns regarding medicines are outlined above.  No orders of the defined types were placed in this encounter.   No orders of the defined types were placed in this encounter.   There are no Patient Instructions on file for this visit.   Signed, Kate Sable, MD  09/10/2021 12:46 PM    [place]

## 2021-09-15 DIAGNOSIS — Z713 Dietary counseling and surveillance: Secondary | ICD-10-CM | POA: Diagnosis not present

## 2021-09-15 DIAGNOSIS — E669 Obesity, unspecified: Secondary | ICD-10-CM | POA: Diagnosis not present

## 2021-09-15 DIAGNOSIS — Z9884 Bariatric surgery status: Secondary | ICD-10-CM | POA: Diagnosis not present

## 2021-11-30 DIAGNOSIS — Z853 Personal history of malignant neoplasm of breast: Secondary | ICD-10-CM | POA: Diagnosis not present

## 2021-11-30 DIAGNOSIS — Z170 Estrogen receptor positive status [ER+]: Secondary | ICD-10-CM | POA: Diagnosis not present

## 2021-11-30 DIAGNOSIS — Z9221 Personal history of antineoplastic chemotherapy: Secondary | ICD-10-CM | POA: Diagnosis not present

## 2021-11-30 DIAGNOSIS — C50912 Malignant neoplasm of unspecified site of left female breast: Secondary | ICD-10-CM | POA: Diagnosis not present

## 2021-11-30 DIAGNOSIS — Z9012 Acquired absence of left breast and nipple: Secondary | ICD-10-CM | POA: Diagnosis not present

## 2021-11-30 DIAGNOSIS — Z6835 Body mass index (BMI) 35.0-35.9, adult: Secondary | ICD-10-CM | POA: Diagnosis not present

## 2021-11-30 DIAGNOSIS — Z08 Encounter for follow-up examination after completed treatment for malignant neoplasm: Secondary | ICD-10-CM | POA: Diagnosis not present

## 2021-12-09 DIAGNOSIS — E669 Obesity, unspecified: Secondary | ICD-10-CM | POA: Diagnosis not present

## 2021-12-09 DIAGNOSIS — Z9884 Bariatric surgery status: Secondary | ICD-10-CM | POA: Diagnosis not present

## 2021-12-09 DIAGNOSIS — Z713 Dietary counseling and surveillance: Secondary | ICD-10-CM | POA: Diagnosis not present

## 2021-12-09 DIAGNOSIS — K912 Postsurgical malabsorption, not elsewhere classified: Secondary | ICD-10-CM | POA: Diagnosis not present

## 2021-12-31 ENCOUNTER — Encounter (INDEPENDENT_AMBULATORY_CARE_PROVIDER_SITE_OTHER): Payer: Self-pay | Admitting: Vascular Surgery

## 2022-01-03 ENCOUNTER — Telehealth (INDEPENDENT_AMBULATORY_CARE_PROVIDER_SITE_OTHER): Payer: Self-pay

## 2022-01-03 NOTE — Telephone Encounter (Signed)
Pt called and left a VM on the nurses line wanting to know when would her laser ablation be scheduled  please advise.

## 2022-01-03 NOTE — Telephone Encounter (Signed)
Could you please reach out. Thanks

## 2022-01-06 ENCOUNTER — Other Ambulatory Visit (INDEPENDENT_AMBULATORY_CARE_PROVIDER_SITE_OTHER): Payer: Self-pay

## 2022-01-06 DIAGNOSIS — E669 Obesity, unspecified: Secondary | ICD-10-CM | POA: Diagnosis not present

## 2022-01-06 DIAGNOSIS — Z9884 Bariatric surgery status: Secondary | ICD-10-CM | POA: Diagnosis not present

## 2022-01-06 DIAGNOSIS — Z713 Dietary counseling and surveillance: Secondary | ICD-10-CM | POA: Diagnosis not present

## 2022-01-26 ENCOUNTER — Other Ambulatory Visit (INDEPENDENT_AMBULATORY_CARE_PROVIDER_SITE_OTHER): Payer: Self-pay | Admitting: Nurse Practitioner

## 2022-01-26 ENCOUNTER — Telehealth (INDEPENDENT_AMBULATORY_CARE_PROVIDER_SITE_OTHER): Payer: Self-pay | Admitting: *Deleted

## 2022-01-26 MED ORDER — ALPRAZOLAM 0.5 MG PO TABS: ORAL_TABLET | ORAL | 0 refills | Status: DC

## 2022-01-26 NOTE — Telephone Encounter (Signed)
Orders sent with first fill available on 02/16/2022

## 2022-01-26 NOTE — Telephone Encounter (Signed)
Patient is scheduled for her laser procedure and she is aware. Information is being mailed to her.

## 2022-02-21 ENCOUNTER — Telehealth (INDEPENDENT_AMBULATORY_CARE_PROVIDER_SITE_OTHER): Payer: Self-pay

## 2022-02-21 NOTE — Telephone Encounter (Signed)
Spoke with the patient regarding her laser procedure and answered a few questions about pain, medications, traveling and providers. Patient is scheduled for a laser procedure on 02/24/22 with Dr. Delana Meyer and f/u ultrasound on 03/07/22. Patient's prescription was called in and on 01/26/22 and I confirmed with [facility] regarding her prescription. ?

## 2022-02-22 ENCOUNTER — Other Ambulatory Visit (INDEPENDENT_AMBULATORY_CARE_PROVIDER_SITE_OTHER): Payer: BC Managed Care – PPO | Admitting: Vascular Surgery

## 2022-02-24 ENCOUNTER — Encounter (INDEPENDENT_AMBULATORY_CARE_PROVIDER_SITE_OTHER): Payer: Self-pay | Admitting: Vascular Surgery

## 2022-02-24 ENCOUNTER — Other Ambulatory Visit: Payer: Self-pay

## 2022-02-24 ENCOUNTER — Ambulatory Visit (INDEPENDENT_AMBULATORY_CARE_PROVIDER_SITE_OTHER): Payer: BC Managed Care – PPO | Admitting: Vascular Surgery

## 2022-02-24 VITALS — BP 107/69 | HR 86 | Resp 16 | Wt 212.0 lb

## 2022-02-24 DIAGNOSIS — I83812 Varicose veins of left lower extremities with pain: Secondary | ICD-10-CM

## 2022-02-24 DIAGNOSIS — I83819 Varicose veins of unspecified lower extremities with pain: Secondary | ICD-10-CM

## 2022-02-24 NOTE — Progress Notes (Signed)
? ? ?  MRN : [id number] ? ?[name] is a 51 y.o. (09-12-1971) female who presents with chief complaint of painful varicose veins. ? ? ? ?The patient's left lower extremity was sterilely prepped and draped.  The ultrasound machine was used to visualize the left great saphenous vein throughout its course.  A segment below the knee was selected for access.  The saphenous vein was accessed without difficulty using ultrasound guidance with a micropuncture needle.   An 0.018  wire was placed beyond the saphenofemoral junction through the sheath and the microneedle was removed.  The 65 cm sheath was then placed over the wire and the wire and dilator were removed.  The laser fiber was placed through the sheath and its tip was placed approximately 2 cm below the saphenofemoral junction.  Tumescent anesthesia was then created with a dilute lidocaine solution.  Laser energy was then delivered with constant withdrawal of the sheath and laser fiber.  Approximately 1775 Joules of energy were delivered over a length of 44 cm.  Sterile dressings were placed.  The patient tolerated the procedure well without complications.  ?

## 2022-03-01 ENCOUNTER — Encounter (INDEPENDENT_AMBULATORY_CARE_PROVIDER_SITE_OTHER): Payer: BC Managed Care – PPO

## 2022-03-04 ENCOUNTER — Other Ambulatory Visit (INDEPENDENT_AMBULATORY_CARE_PROVIDER_SITE_OTHER): Payer: Self-pay | Admitting: Vascular Surgery

## 2022-03-04 DIAGNOSIS — I83813 Varicose veins of bilateral lower extremities with pain: Secondary | ICD-10-CM

## 2022-03-07 ENCOUNTER — Ambulatory Visit (INDEPENDENT_AMBULATORY_CARE_PROVIDER_SITE_OTHER): Payer: BC Managed Care – PPO

## 2022-03-07 ENCOUNTER — Other Ambulatory Visit: Payer: Self-pay

## 2022-03-07 DIAGNOSIS — M25612 Stiffness of left shoulder, not elsewhere classified: Secondary | ICD-10-CM | POA: Diagnosis not present

## 2022-03-07 DIAGNOSIS — I83813 Varicose veins of bilateral lower extremities with pain: Secondary | ICD-10-CM

## 2022-03-07 DIAGNOSIS — N644 Mastodynia: Secondary | ICD-10-CM | POA: Diagnosis not present

## 2022-03-07 DIAGNOSIS — C50812 Malignant neoplasm of overlapping sites of left female breast: Secondary | ICD-10-CM | POA: Diagnosis not present

## 2022-03-07 DIAGNOSIS — Z170 Estrogen receptor positive status [ER+]: Secondary | ICD-10-CM | POA: Diagnosis not present

## 2022-03-07 DIAGNOSIS — R922 Inconclusive mammogram: Secondary | ICD-10-CM | POA: Diagnosis not present

## 2022-03-21 DIAGNOSIS — M25612 Stiffness of left shoulder, not elsewhere classified: Secondary | ICD-10-CM | POA: Diagnosis not present

## 2022-03-24 DIAGNOSIS — M25612 Stiffness of left shoulder, not elsewhere classified: Secondary | ICD-10-CM | POA: Diagnosis not present

## 2022-03-25 ENCOUNTER — Ambulatory Visit (INDEPENDENT_AMBULATORY_CARE_PROVIDER_SITE_OTHER): Payer: BC Managed Care – PPO | Admitting: Vascular Surgery

## 2022-04-01 ENCOUNTER — Encounter: Payer: BC Managed Care – PPO | Admitting: Obstetrics and Gynecology

## 2022-04-05 ENCOUNTER — Ambulatory Visit (INDEPENDENT_AMBULATORY_CARE_PROVIDER_SITE_OTHER): Payer: BC Managed Care – PPO | Admitting: Obstetrics and Gynecology

## 2022-04-05 ENCOUNTER — Encounter: Payer: Self-pay | Admitting: Obstetrics and Gynecology

## 2022-04-05 VITALS — BP 111/72 | HR 82 | Ht 65.0 in | Wt 206.3 lb

## 2022-04-05 DIAGNOSIS — Z1231 Encounter for screening mammogram for malignant neoplasm of breast: Secondary | ICD-10-CM

## 2022-04-05 DIAGNOSIS — N938 Other specified abnormal uterine and vaginal bleeding: Secondary | ICD-10-CM

## 2022-04-05 DIAGNOSIS — Z01419 Encounter for gynecological examination (general) (routine) without abnormal findings: Secondary | ICD-10-CM

## 2022-04-05 DIAGNOSIS — M25612 Stiffness of left shoulder, not elsewhere classified: Secondary | ICD-10-CM | POA: Diagnosis not present

## 2022-04-05 DIAGNOSIS — Z853 Personal history of malignant neoplasm of breast: Secondary | ICD-10-CM | POA: Diagnosis not present

## 2022-04-05 MED ORDER — NORETHINDRONE ACETATE 5 MG PO TABS: 5.0000 mg | ORAL_TABLET | Freq: Three times a day (TID) | ORAL | 0 refills | Status: DC

## 2022-04-05 NOTE — Progress Notes (Signed)
HPI: ?     [name] is a 51 y.o. [id number] who LMP was Patient's last menstrual period was 03/23/2022 (approximate). ? ?Subjective:  ? ?She presents today stating that her bleeding has been very irregular over the last several months.  She went approximately 4 months without a period and then began having irregular bleeding which consisted of a light period/spotting followed by episodes of heavy menstrual bleeding.  During the heavy menstrual bleeding episodes she used TXA to control her bleeding.  She states that just this morning she began bleeding very heavily again. ?Of significant note, patient has a history of breast cancer is currently taking tamoxifen.  She is approximately 2 years into it. ? ?  Hx: ?The following portions of the patient's history were reviewed and updated as appropriate: ?            She  has a past medical history of Blood in urine (2014), Breast mass (2013), BV (bacterial vaginosis), Cancer ([place]), Edema, Hypertension, and Thyroid disease. ?She does not have any pertinent problems on file. ?She  has a past surgical history that includes Cesarean section (2007); Dilation and curettage of uterus; Roux-en-y procedure (03/04/2014); Breast biopsy (Right, 2013); Breast biopsy (Left, 06-12-13); Breast biopsy (Right, 06-17-13); Foot surgery (Left); and Tubal ligation. ?Her family history includes Arthritis in her mother; Cancer (age of onset: 46) in her maternal grandmother; Diabetes in her father; Hypertension in her father; Hypothyroidism in her sister; Polycystic ovary syndrome in her sister. ?She  reports that she has quit smoking. She has never used smokeless tobacco. She reports current alcohol use. She reports that she does not use drugs. ?She has a current medication list which includes the following prescription(s): hydrochlorothiazide, multivitamins with iron, norethindrone, wegovy, tamoxifen, and valacyclovir. ?She is allergic to tape, avelox [moxifloxacin hcl in nacl], and  chlorhexidine. ?      ?Review of Systems:  ?Review of Systems ? ?Constitutional: Denied constitutional symptoms, night sweats, recent illness, fatigue, fever, insomnia and weight loss.  ?Eyes: Denied eye symptoms, eye pain, photophobia, vision change and visual disturbance.  ?Ears/Nose/Throat/Neck: Denied ear, nose, throat or neck symptoms, hearing loss, nasal discharge, sinus congestion and sore throat.  ?Cardiovascular: Denied cardiovascular symptoms, arrhythmia, chest pain/pressure, edema, exercise intolerance, orthopnea and palpitations.  ?Respiratory: Denied pulmonary symptoms, asthma, pleuritic pain, productive sputum, cough, dyspnea and wheezing.  ?Gastrointestinal: Denied, gastro-esophageal reflux, melena, nausea and vomiting.  ?Genitourinary: See HPI for additional information.  ?Musculoskeletal: Denied musculoskeletal symptoms, stiffness, swelling, muscle weakness and myalgia.  ?Dermatologic: Denied dermatology symptoms, rash and scar.  ?Neurologic: Denied neurology symptoms, dizziness, headache, neck pain and syncope.  ?Psychiatric: Denied psychiatric symptoms, anxiety and depression.  ?Endocrine: Denied endocrine symptoms including hot flashes and night sweats.  ? ?Meds: ?  ?Current Outpatient Medications on File Prior to Visit  ?Medication Sig Dispense Refill  ? hydrochlorothiazide (HYDRODIURIL) 25 MG tablet Take 1 tablet (25 mg total) by mouth daily. 90 tablet 3  ? Multiple Vitamins-Iron (MULTIVITAMINS WITH IRON) TABS tablet Take 1 tablet by mouth daily.    ? Semaglutide-Weight Management (WEGOVY) 2.4 MG/0.75ML SOAJ Inject 2.4 mg into the skin once a week.    ? tamoxifen (NOLVADEX) 20 MG tablet Take 20 mg by mouth daily.    ? valACYclovir (VALTREX) 500 MG tablet Take 500 mg by mouth 2 (two) times daily.    ? ?No current facility-administered medications on file prior to visit.  ? ? ? ? ?Objective:  ?  ? ?Vitals:  ?  04/05/22 1347  ?BP: 111/72  ?Pulse: 82  ? ?Filed Weights  ? 04/05/22 1347  ?Weight: 206  lb 4.8 oz (93.6 kg)  ? ?  ?         Physical examination ?General NAD, Conversant  ?HEENT Atraumatic; Op clear with mmm.  Normo-cephalic. Pupils reactive. Anicteric sclerae  ?Thyroid/Neck Smooth without nodularity or enlargement. Normal ROM.  Neck Supple.  ?Skin No rashes, lesions or ulceration. Normal palpated skin turgor. No nodularity.  ?Breasts: No masses or discharge.  Symmetric.  No axillary adenopathy.  ?Lungs: Clear to auscultation.No rales or wheezes. Normal Respiratory effort, no retractions.  ?Heart: NSR.  No murmurs or rubs appreciated. No periferal edema  ?Abdomen: Soft.  Non-tender.  No masses.  No HSM. No hernia  ?Extremities: Moves all appropriately.  Normal ROM for age. No lymphadenopathy.  ?Neuro: Oriented to PPT.  Normal mood. Normal affect.  ? ? ? ?        ? ?Assessment:  ?  ?[id number] ?Patient Active Problem List  ? Diagnosis Date Noted  ? Concentration deficit 07/14/2021  ? Malignant neoplasm of overlapping sites of left breast in female, estrogen receptor positive ([place]) 02/20/2020  ? SOB (shortness of breath) 09/04/2019  ? Otitis media 08/30/2019  ? Suspected COVID-19 virus infection 08/29/2019  ? Leg swelling 09/03/2018  ? Acute sinusitis 06/15/2017  ? Climacteric 03/30/2017  ? Varicose veins with pain 11/17/2015  ? Hearing loss 05/25/2015  ? Hair loss 11/17/2014  ? Status post tubal ligation 09/22/2014  ? Routine general medical examination at a health care facility 05/15/2013  ? Goiter, nontoxic, multinodular 02/25/2013  ? Pes planus 11/20/2012  ? Obesity 11/12/2012  ? Hypertension 05/09/2012  ? ?  ?1. Well woman exam with routine gynecological exam   ?2. History of breast cancer   ?3. DUB (dysfunctional uterine bleeding)   ? ? Patient with heavy bleeding today not sure if it is premenopausal or postmenopausal as she went several months without a menses prior to bleeding for the last few months. ? ?Patient has been on tamoxifen-May be part of the bleeding picture as well. ? ? ?Plan:  ?  ?        ? 1.  As patient is not fasting today she will need to return for blood work ? 2.  Add FSH to blood work to determine pre or postmenopausal ? 3.  Recommend endometrial biopsy for irregular and dysfunctional bleeding unable to do today because of heavy bleeding.  Progesterone control prior to biopsy ? 4.  Recommend Mirena IUD if no evidence of hyperplasia -an attempt to thwart future episodes of heavy bleeding. ? ?Orders ?Orders Placed This Encounter  ?Procedures  ? Basic metabolic panel  ? CBC  ? TSH  ? Lipid panel  ? Hemoglobin A1c  ? Follicle stimulating hormone  ? ?  ?Meds ordered this encounter  ?Medications  ? norethindrone (AYGESTIN) 5 MG tablet  ?  Sig: Take 1 tablet (5 mg total) by mouth 3 (three) times daily for 21 days. As directed  ?  Dispense:  60 tablet  ?  Refill:  0  ?  ?  F/U ? Return in about 2 weeks (around 04/19/2022).  ? ? ?Finis Bud, M.D. ?04/05/2022 ?2:22 PM ? ? ? ? ?

## 2022-04-05 NOTE — Progress Notes (Signed)
Patients presents for annual exam today. Patient states she is still having trouble with her menstrual cycle. She states she has an ongoing period of light bleeding and then some days will have a heavy flow. Patients annual labs are ordered. Patient is up to date on pap smear.  ?Patient states no other questions or concerns at this time.  ? ?

## 2022-04-06 ENCOUNTER — Other Ambulatory Visit: Payer: BC Managed Care – PPO

## 2022-04-06 DIAGNOSIS — N938 Other specified abnormal uterine and vaginal bleeding: Secondary | ICD-10-CM | POA: Diagnosis not present

## 2022-04-06 DIAGNOSIS — Z01419 Encounter for gynecological examination (general) (routine) without abnormal findings: Secondary | ICD-10-CM | POA: Diagnosis not present

## 2022-04-07 ENCOUNTER — Ambulatory Visit (INDEPENDENT_AMBULATORY_CARE_PROVIDER_SITE_OTHER): Payer: BC Managed Care – PPO | Admitting: Vascular Surgery

## 2022-04-07 ENCOUNTER — Encounter (INDEPENDENT_AMBULATORY_CARE_PROVIDER_SITE_OTHER): Payer: Self-pay | Admitting: Vascular Surgery

## 2022-04-07 VITALS — BP 106/66 | HR 77 | Resp 18 | Ht 65.0 in | Wt 209.6 lb

## 2022-04-07 DIAGNOSIS — I10 Essential (primary) hypertension: Secondary | ICD-10-CM

## 2022-04-07 DIAGNOSIS — I83819 Varicose veins of unspecified lower extremities with pain: Secondary | ICD-10-CM

## 2022-04-07 DIAGNOSIS — M25612 Stiffness of left shoulder, not elsewhere classified: Secondary | ICD-10-CM | POA: Diagnosis not present

## 2022-04-07 LAB — LIPID PANEL
Chol/HDL Ratio: 2.5 ratio (ref 0.0–4.4)
Cholesterol, Total: 143 mg/dL (ref 100–199)
HDL: 57 mg/dL
LDL Chol Calc (NIH): 70 mg/dL (ref 0–99)
Triglycerides: 82 mg/dL (ref 0–149)
VLDL Cholesterol Cal: 16 mg/dL (ref 5–40)

## 2022-04-07 LAB — CBC
Hematocrit: 36.5 % (ref 34.0–46.6)
Hemoglobin: 12 g/dL (ref 11.1–15.9)
MCH: 29.7 pg (ref 26.6–33.0)
MCHC: 32.9 g/dL (ref 31.5–35.7)
MCV: 90 fL (ref 79–97)
Platelets: 210 x10E3/uL (ref 150–450)
RBC: 4.04 x10E6/uL (ref 3.77–5.28)
RDW: 13 % (ref 11.7–15.4)
WBC: 4.7 x10E3/uL (ref 3.4–10.8)

## 2022-04-07 LAB — BASIC METABOLIC PANEL
BUN/Creatinine Ratio: 16 (ref 9–23)
BUN: 11 mg/dL (ref 6–24)
CO2: 27 mmol/L (ref 20–29)
Calcium: 8.9 mg/dL (ref 8.7–10.2)
Chloride: 106 mmol/L (ref 96–106)
Creatinine, Ser: 0.69 mg/dL (ref 0.57–1.00)
Glucose: 83 mg/dL (ref 70–99)
Potassium: 4.1 mmol/L (ref 3.5–5.2)
Sodium: 145 mmol/L — ABNORMAL HIGH (ref 134–144)
eGFR: 106 mL/min/{1.73_m2} (ref >59)

## 2022-04-07 LAB — FOLLICLE STIMULATING HORMONE: FSH: 29.5 m[IU]/mL

## 2022-04-07 LAB — HEMOGLOBIN A1C
Est. average glucose Bld gHb Est-mCnc: 88 mg/dL
Hgb A1c MFr Bld: 4.7 % — ABNORMAL LOW (ref 4.8–5.6)

## 2022-04-07 LAB — TSH: TSH: 0.978 u[IU]/mL (ref 0.450–4.500)

## 2022-04-07 NOTE — Progress Notes (Signed)
? ? ? ? ?MRN : [id number] ? ?[name] is a 51 y.o. (14-Feb-1971) female who presents with chief complaint of varicose veins hurt. ? ?History of Present Illness:  ? ?The patient returns to the office for followup status post laser ablation of the left great saphenous vein on 02/24/2022. The patient notes multiple residual varicosities bilaterally which continued to hurt with dependent positions and remained tender to palpation. The patient's swelling is unchanged from preoperative status. The patient continues to wear graduated compression stockings on a daily basis but these are not eliminating the pain and discomfort. The patient continues to use over-the-counter anti-inflammatory medications to treat the pain and related symptoms but this has not given the patient relief. The patient notes the pain in the lower extremities is causing problems with daily exercise, problems at work and even with household activities such as preparing meals and doing dishes. ? ?Today the patient is also noting increased pain associated with bulging veins of the right lower extremity.  She is concerned about the progression of both pain and the size of the veins. ? ?The patient is otherwise done well and there have been no complications related to the laser procedure or interval changes in the patient's overall  ? ?Venous ultrasound post laser shows successful laser ablation of the left, no DVT identified.  ? ?Current Meds  ?Medication Sig  ? hydrochlorothiazide (HYDRODIURIL) 25 MG tablet Take 1 tablet (25 mg total) by mouth daily.  ? Multiple Vitamins-Iron (MULTIVITAMINS WITH IRON) TABS tablet Take 1 tablet by mouth daily.  ? norethindrone (AYGESTIN) 5 MG tablet Take 1 tablet (5 mg total) by mouth 3 (three) times daily for 21 days. As directed  ? Semaglutide-Weight Management (WEGOVY) 2.4 MG/0.75ML SOAJ Inject 2.4 mg into the skin once a week.  ? tamoxifen (NOLVADEX) 20 MG tablet Take 20 mg by mouth daily.  ? valACYclovir  (VALTREX) 500 MG tablet Take 500 mg by mouth 2 (two) times daily.  ? ? ?Past Medical History:  ?Diagnosis Date  ? Blood in urine 2014  ? Breast mass 2013  ? BV (bacterial vaginosis)   ? Cancer [facility])   ? Breast Jan 2021  ? Edema   ? legs  ? Hypertension   ? Thyroid disease   ? ? ?Past Surgical History:  ?Procedure Laterality Date  ? BREAST BIOPSY Right 2013  ? benign - fatty tissue  ? BREAST BIOPSY Left 06-12-13  ? fibroadenoma /core biopsy  ? BREAST BIOPSY Right 06-17-13  ? fibrocystic /stereo biopsy  ? CESAREAN SECTION  2007  ? DILATION AND CURETTAGE OF UTERUS    ? FOOT SURGERY Left   ? 01/2017  ? ROUX-EN-Y PROCEDURE  03/04/2014  ? Dr. Saint Lucia  ? TUBAL LIGATION    ? ? ?Social History ?Social History  ? ?Tobacco Use  ? Smoking status: Former  ? Smokeless tobacco: Never  ? Tobacco comments:  ?  quit 2006  ?Vaping Use  ? Vaping Use: Never used  ?Substance Use Topics  ? Alcohol use: Yes  ?  Comment: rare  ? Drug use: No  ? ? ?Family History ?Family History  ?Problem Relation Age of Onset  ? Arthritis Mother   ? Diabetes Father   ? Hypertension Father   ? Polycystic ovary syndrome Sister   ? Hypothyroidism Sister   ? Cancer Maternal Grandmother 50  ?     breast  ? Ovarian cancer Neg Hx   ? Colon cancer Neg Hx   ? ? ?  Allergies  ?Allergen Reactions  ? Tape Other (See Comments)  ?  Blistering, ok with paper tape  ? Avelox [Moxifloxacin Hcl In Nacl] Palpitations  ? Chlorhexidine Itching  ?  Chg wipes cause severe itching  ? ? ? ?REVIEW OF SYSTEMS (Negative unless checked) ? ?Constitutional: '[]'$ Weight loss  '[]'$ Fever  '[]'$ Chills ?Cardiac: '[]'$ Chest pain   '[]'$ Chest pressure   '[]'$ Palpitations   '[]'$ Shortness of breath when laying flat   '[]'$ Shortness of breath with exertion. ?Vascular:  '[]'$ Pain in legs with walking   '[x]'$ Pain in legs with standing  '[]'$ History of DVT   '[]'$ Phlebitis   '[]'$ Swelling in legs   '[x]'$ Varicose veins   '[]'$ Non-healing ulcers ?Pulmonary:   '[]'$ Uses home oxygen   '[]'$ Productive cough   '[]'$ Hemoptysis   '[]'$ Wheeze  '[]'$ COPD    '[]'$ Asthma ?Neurologic:  '[]'$ Dizziness   '[]'$ Seizures   '[]'$ History of stroke   '[]'$ History of TIA  '[]'$ Aphasia   '[]'$ Vissual changes   '[]'$ Weakness or numbness in arm   '[]'$ Weakness or numbness in leg ?Musculoskeletal:   '[]'$ Joint swelling   '[]'$ Joint pain   '[]'$ Low back pain ?Hematologic:  '[]'$ Easy bruising  '[]'$ Easy bleeding   '[]'$ Hypercoagulable state   '[]'$ Anemic ?Gastrointestinal:  '[]'$ Diarrhea   '[]'$ Vomiting  '[]'$ Gastroesophageal reflux/heartburn   '[]'$ Difficulty swallowing. ?Genitourinary:  '[]'$ Chronic kidney disease   '[]'$ Difficult urination  '[]'$ Frequent urination   '[]'$ Blood in urine ?Skin:  '[]'$ Rashes   '[]'$ Ulcers  ?Psychological:  '[]'$ History of anxiety   '[]'$  History of major depression. ? ?Physical Examination ? ?Vitals:  ? 04/07/22 0911  ?BP: 106/66  ?Pulse: 77  ?Resp: 18  ?Weight: 209 lb 9.6 oz (95.1 kg)  ?Height: '5\' 5"'$  (1.651 m)  ? ?Body mass index is 34.88 kg/m?. ?Gen: WD/WN, NAD ?Head: [place]/AT, No temporalis wasting.  ?Ear/Nose/Throat: Hearing grossly intact, nares w/o erythema or drainage, pinna without lesions ?Eyes: PER, EOMI, sclera nonicteric.  ?Neck: Supple, no gross masses.  No JVD.  ?Pulmonary:  Good air movement, no audible wheezing, no use of accessory muscles.  ?Cardiac: RRR, precordium not hyperdynamic. ?Vascular:  Large varicosities present, greater than 10 mm left and right legs.  Veins are tender to palpation  Moderate venous stasis changes to the legs bilaterally.  Trace soft pitting edema  ?Vessel Right Left  ?Radial Palpable Palpable  ?Gastrointestinal: soft, non-distended. No guarding/no peritoneal signs.  ?Musculoskeletal: M/S 5/5 throughout.  No deformity.  ?Neurologic: CN 2-12 intact. Pain and light touch intact in extremities.  Symmetrical.  Speech is fluent. Motor exam as listed above. ?Psychiatric: Judgment intact, Mood & affect appropriate for pt's clinical situation. ?Dermatologic: Venous rashes no ulcers noted.  No changes consistent with cellulitis. ?Lymph : No lichenification or skin changes of chronic  lymphedema. ? ?CBC ?Lab Results  ?Component Value Date  ? WBC 4.7 04/06/2022  ? HGB 12.0 04/06/2022  ? HCT 36.5 04/06/2022  ? MCV 90 04/06/2022  ? PLT 210 04/06/2022  ? ? ?BMET ?   ?Component Value Date/Time  ? NA 145 (H) 04/06/2022 0858  ? K 4.1 04/06/2022 0858  ? CL 106 04/06/2022 0858  ? CO2 27 04/06/2022 0858  ? GLUCOSE 83 04/06/2022 0858  ? GLUCOSE 91 12/24/2019 0905  ? BUN 11 04/06/2022 0858  ? CREATININE 0.69 04/06/2022 0858  ? CALCIUM 8.9 04/06/2022 0858  ? GFRNONAA >60 09/04/2019 2159  ? GFRAA >60 09/04/2019 2159  ? ?Estimated Creatinine Clearance: 95.9 mL/min (by C-G formula based on SCr of 0.69 mg/dL). ? ?COAG ?No results found for: INR, PROTIME ? ?Radiology ?No results found. ? ? ?  Assessment/Plan ?1. Varicose veins with pain ?Recommend: ? ?The patient has had successful ablation of the previously incompetent left great saphenous venous system but still has persistent symptoms of pain and swelling that are having a negative impact on daily life and daily activities.  She is now also c/o pain on the right associated with varicose veins.  Ultrasound of the right will be obtained to see if laser ablation is indicated.  ? ?Patient should undergo injection sclerotherapy to treat the residual varicosities. ? ?The risks, benefits and alternative therapies were reviewed in detail with the patient.  All questions were answered.  The patient agrees to proceed with sclerotherapy at their convenience. ? ?The patient will continue wearing the graduated compression stockings and using the over-the-counter pain medications to treat her symptoms.  ? ?- VAS [place] LOWER EXTREMITY VENOUS REFLUX; Future ? ?2. Primary hypertension ?Continue antihypertensive medications as already ordered, these medications have been reviewed and there are no changes at this time.  ? ? ? ?Hortencia Pilar, MD ? ?04/07/2022 ?9:34 AM ? ?  ?

## 2022-04-11 DIAGNOSIS — H52223 Regular astigmatism, bilateral: Secondary | ICD-10-CM | POA: Diagnosis not present

## 2022-04-11 DIAGNOSIS — H5213 Myopia, bilateral: Secondary | ICD-10-CM | POA: Diagnosis not present

## 2022-04-11 DIAGNOSIS — H353131 Nonexudative age-related macular degeneration, bilateral, early dry stage: Secondary | ICD-10-CM | POA: Diagnosis not present

## 2022-04-11 DIAGNOSIS — M25612 Stiffness of left shoulder, not elsewhere classified: Secondary | ICD-10-CM | POA: Diagnosis not present

## 2022-04-11 DIAGNOSIS — H2513 Age-related nuclear cataract, bilateral: Secondary | ICD-10-CM | POA: Diagnosis not present

## 2022-04-14 ENCOUNTER — Encounter: Payer: Self-pay | Admitting: Obstetrics and Gynecology

## 2022-04-14 DIAGNOSIS — Z9884 Bariatric surgery status: Secondary | ICD-10-CM | POA: Diagnosis not present

## 2022-04-14 DIAGNOSIS — K912 Postsurgical malabsorption, not elsewhere classified: Secondary | ICD-10-CM | POA: Diagnosis not present

## 2022-04-14 DIAGNOSIS — Z713 Dietary counseling and surveillance: Secondary | ICD-10-CM | POA: Diagnosis not present

## 2022-04-14 DIAGNOSIS — Z48815 Encounter for surgical aftercare following surgery on the digestive system: Secondary | ICD-10-CM | POA: Diagnosis not present

## 2022-04-18 ENCOUNTER — Ambulatory Visit (INDEPENDENT_AMBULATORY_CARE_PROVIDER_SITE_OTHER): Payer: BC Managed Care – PPO | Admitting: Vascular Surgery

## 2022-04-18 ENCOUNTER — Encounter (INDEPENDENT_AMBULATORY_CARE_PROVIDER_SITE_OTHER): Payer: Self-pay | Admitting: Vascular Surgery

## 2022-04-18 ENCOUNTER — Ambulatory Visit (INDEPENDENT_AMBULATORY_CARE_PROVIDER_SITE_OTHER): Payer: BC Managed Care – PPO

## 2022-04-18 VITALS — BP 111/65 | HR 73 | Resp 16 | Wt 213.0 lb

## 2022-04-18 DIAGNOSIS — M7989 Other specified soft tissue disorders: Secondary | ICD-10-CM

## 2022-04-18 DIAGNOSIS — I10 Essential (primary) hypertension: Secondary | ICD-10-CM | POA: Diagnosis not present

## 2022-04-18 DIAGNOSIS — I83819 Varicose veins of unspecified lower extremities with pain: Secondary | ICD-10-CM

## 2022-04-18 NOTE — Progress Notes (Signed)
? ? ? ? ?MRN : [id number] ? ?[name] is a 51 y.o. ([birth year]) female who presents with chief complaint of varicose veins hurt. ? ?History of Present Illness:  ? ?The patient returns for followup evaluation 3 months after the initial visit. The patient continues to have pain in the lower extremities with dependency. The pain is lessened with elevation. Graduated compression stockings, Class I (20-30 mmHg), have been worn but the stockings do not eliminate the leg pain. Over-the-counter analgesics do not improve the symptoms. The degree of discomfort continues to interfere with daily activities. The patient notes the pain in the legs is causing problems with daily exercise, at the workplace and even with household activities and maintenance such as standing in the kitchen preparing meals and doing dishes.  ? ?Venous ultrasound shows normal deep venous system, no evidence of acute or chronic DVT.  Superficial reflux is present in the right great saphenous vein.  ? ?No outpatient medications have been marked as taking for the 04/18/22 encounter (Appointment) with Delana Meyer, Dolores Lory, MD.  ? ? ?Past Medical History:  ?Diagnosis Date  ? Blood in urine 2014  ? Breast mass 2013  ? BV (bacterial vaginosis)   ? Cancer [facility])   ? Breast Jan 2021  ? Edema   ? legs  ? Hypertension   ? Thyroid disease   ? ? ?Past Surgical History:  ?Procedure Laterality Date  ? BREAST BIOPSY Right 2013  ? benign - fatty tissue  ? BREAST BIOPSY Left 06-12-13  ? fibroadenoma /core biopsy  ? BREAST BIOPSY Right 06-17-13  ? fibrocystic /stereo biopsy  ? CESAREAN SECTION  2007  ? DILATION AND CURETTAGE OF UTERUS    ? FOOT SURGERY Left   ? 01/2017  ? ROUX-EN-Y PROCEDURE  03/04/2014  ? Dr. Saint Lucia  ? TUBAL LIGATION    ? ? ?Social History ?Social History  ? ?Tobacco Use  ? Smoking status: Former  ? Smokeless tobacco: Never  ? Tobacco comments:  ?  quit 2006  ?Vaping Use  ? Vaping Use: Never used  ?Substance Use Topics  ? Alcohol use: Yes  ?  Comment: rare   ? Drug use: No  ? ? ?Family History ?Family History  ?Problem Relation Age of Onset  ? Arthritis Mother   ? Diabetes Father   ? Hypertension Father   ? Polycystic ovary syndrome Sister   ? Hypothyroidism Sister   ? Cancer Maternal Grandmother 70  ?     breast  ? Ovarian cancer Neg Hx   ? Colon cancer Neg Hx   ? ? ?Allergies  ?Allergen Reactions  ? Tape Other (See Comments)  ?  Blistering, ok with paper tape  ? Avelox [Moxifloxacin Hcl In Nacl] Palpitations  ? Chlorhexidine Itching  ?  Chg wipes cause severe itching  ? ? ? ?REVIEW OF SYSTEMS (Negative unless checked) ? ?Constitutional: '[]'$ Weight loss  '[]'$ Fever  '[]'$ Chills ?Cardiac: '[]'$ Chest pain   '[]'$ Chest pressure   '[]'$ Palpitations   '[]'$ Shortness of breath when laying flat   '[]'$ Shortness of breath with exertion. ?Vascular:  '[]'$ Pain in legs with walking   '[x]'$ Pain in legs with standing  '[]'$ History of DVT   '[]'$ Phlebitis   '[]'$ Swelling in legs   '[x]'$ Varicose veins   '[]'$ Non-healing ulcers ?Pulmonary:   '[]'$ Uses home oxygen   '[]'$ Productive cough   '[]'$ Hemoptysis   '[]'$ Wheeze  '[]'$ COPD   '[]'$ Asthma ?Neurologic:  '[]'$ Dizziness   '[]'$ Seizures   '[]'$ History of stroke   '[]'$ History of TIA  '[]'$ Aphasia   '[]'$   Vissual changes   '[]'$ Weakness or numbness in arm   '[]'$ Weakness or numbness in leg ?Musculoskeletal:   '[]'$ Joint swelling   '[]'$ Joint pain   '[]'$ Low back pain ?Hematologic:  '[]'$ Easy bruising  '[]'$ Easy bleeding   '[]'$ Hypercoagulable state   '[]'$ Anemic ?Gastrointestinal:  '[]'$ Diarrhea   '[]'$ Vomiting  '[]'$ Gastroesophageal reflux/heartburn   '[]'$ Difficulty swallowing. ?Genitourinary:  '[]'$ Chronic kidney disease   '[]'$ Difficult urination  '[]'$ Frequent urination   '[]'$ Blood in urine ?Skin:  '[]'$ Rashes   '[]'$ Ulcers  ?Psychological:  '[]'$ History of anxiety   '[]'$  History of major depression. ? ?Physical Examination ? ?There were no vitals filed for this visit. ?There is no height or weight on file to calculate BMI. ?Gen: WD/WN, NAD ?Head: [place]/AT, No temporalis wasting.  ?Ear/Nose/Throat: Hearing grossly intact, nares w/o erythema or drainage, pinna without  lesions ?Eyes: PER, EOMI, sclera nonicteric.  ?Neck: Supple, no gross masses.  No JVD.  ?Pulmonary:  Good air movement, no audible wheezing, no use of accessory muscles.  ?Cardiac: RRR, precordium not hyperdynamic. ?Vascular:  Large varicosities present, greater than 10 mm right.  Veins are tender to palpation  Moderate venous stasis changes to the legs bilaterally.  Trace soft pitting edema  ?Vessel Right Left  ?Radial Palpable Palpable  ?Gastrointestinal: soft, non-distended. No guarding/no peritoneal signs.  ?Musculoskeletal: M/S 5/5 throughout.  No deformity.  ?Neurologic: CN 2-12 intact. Pain and light touch intact in extremities.  Symmetrical.  Speech is fluent. Motor exam as listed above. ?Psychiatric: Judgment intact, Mood & affect appropriate for pt's clinical situation. ?Dermatologic: Venous rashes no ulcers noted.  No changes consistent with cellulitis. ?Lymph : No lichenification or skin changes of chronic lymphedema. ? ?CBC ?Lab Results  ?Component Value Date  ? WBC 4.7 04/06/2022  ? HGB 12.0 04/06/2022  ? HCT 36.5 04/06/2022  ? MCV 90 04/06/2022  ? PLT 210 04/06/2022  ? ? ?BMET ?   ?Component Value Date/Time  ? NA 145 (H) 04/06/2022 0858  ? K 4.1 04/06/2022 0858  ? CL 106 04/06/2022 0858  ? CO2 27 04/06/2022 0858  ? GLUCOSE 83 04/06/2022 0858  ? GLUCOSE 91 12/24/2019 0905  ? BUN 11 04/06/2022 0858  ? CREATININE 0.69 04/06/2022 0858  ? CALCIUM 8.9 04/06/2022 0858  ? GFRNONAA >60 09/04/2019 2159  ? GFRAA >60 09/04/2019 2159  ? ?Estimated Creatinine Clearance: 95.9 mL/min (by C-G formula based on SCr of 0.69 mg/dL). ? ?COAG ?No results found for: INR, PROTIME ? ?Radiology ?No results found. ? ? ?Assessment/Plan ?1. Varicose veins with pain ?Recommend ? ?I have reviewed my previous  discussion with the patient regarding  varicose veins and why they cause symptoms. Patient will continue  wearing graduated compression stockings class 1 on a daily basis, beginning first thing in the morning and removing them  in the evening.   ? ?In addition, behavioral modification including elevation during the day was again discussed and this will continue. ? ?The patient has utilized over the counter pain medications and has been exercising. ? ?However, at this time conservative therapy has not alleviated the patient's symptoms of leg pain and swelling ? ?Recommend: laser ablation of the right great saphenous vein to eliminate the symptoms of pain and swelling of the lower extremities caused by the severe superficial venous reflux disease.   ? ?2. Primary hypertension ?Continue antihypertensive medications as already ordered, these medications have been reviewed and there are no changes at this time.  ? ?3. Leg swelling ?Recommend: ? ?No surgery or intervention at this point in time. ? ?  I have reviewed my discussion with the patient regarding venous insufficiency and why it causes symptoms. I have discussed with the patient the chronic skin changes that accompany venous insufficiency and the long term sequela such as ulceration. Patient will contnue wearing graduated compression stockings on a daily basis, as this has provided excellent control of his edema. The patient will put the stockings on first thing in the morning and removing them in the evening. The patient is reminded not to sleep in the stockings. ? ?In addition, behavioral modification including elevation during the day will be initiated. ?Exercise is strongly encouraged. ? ?Previous duplex ultrasound of the lower extremities shows normal deep system, no significant superficial reflux was identified. ? ?Given the patient's good control and lack of any problems regarding the venous insufficiency and lymphedema a lymph pump in not need at this time.   ? ?  ? ? ? ?Hortencia Pilar, MD ? ?04/18/2022 ?12:29 PM ? ?  ?

## 2022-04-19 ENCOUNTER — Telehealth (INDEPENDENT_AMBULATORY_CARE_PROVIDER_SITE_OTHER): Payer: Self-pay | Admitting: Nurse Practitioner

## 2022-04-19 NOTE — Telephone Encounter (Signed)
LVM for pt TCB and schedule appt ? ?L leg sclero. no auth req. see fb/gs ?

## 2022-04-20 ENCOUNTER — Encounter: Payer: BC Managed Care – PPO | Admitting: Obstetrics and Gynecology

## 2022-04-22 ENCOUNTER — Encounter: Payer: BC Managed Care – PPO | Admitting: Obstetrics and Gynecology

## 2022-04-26 ENCOUNTER — Other Ambulatory Visit (HOSPITAL_COMMUNITY)
Admission: RE | Admit: 2022-04-26 | Discharge: 2022-04-26 | Disposition: A | Discharge status: Home / Self Care | Payer: BC Managed Care – PPO | Source: Ambulatory Visit | Attending: Obstetrics and Gynecology | Admitting: Obstetrics and Gynecology

## 2022-04-26 ENCOUNTER — Ambulatory Visit (INDEPENDENT_AMBULATORY_CARE_PROVIDER_SITE_OTHER): Payer: BC Managed Care – PPO | Admitting: Obstetrics and Gynecology

## 2022-04-26 ENCOUNTER — Encounter: Payer: Self-pay | Admitting: Obstetrics and Gynecology

## 2022-04-26 VITALS — BP 106/71 | HR 88 | Ht 65.0 in | Wt 214.2 lb

## 2022-04-26 DIAGNOSIS — Z124 Encounter for screening for malignant neoplasm of cervix: Secondary | ICD-10-CM | POA: Diagnosis not present

## 2022-04-26 DIAGNOSIS — N950 Postmenopausal bleeding: Secondary | ICD-10-CM | POA: Insufficient documentation

## 2022-04-26 DIAGNOSIS — N841 Polyp of cervix uteri: Secondary | ICD-10-CM | POA: Diagnosis not present

## 2022-04-26 DIAGNOSIS — N921 Excessive and frequent menstruation with irregular cycle: Secondary | ICD-10-CM

## 2022-04-26 DIAGNOSIS — N858 Other specified noninflammatory disorders of uterus: Secondary | ICD-10-CM | POA: Diagnosis not present

## 2022-04-26 DIAGNOSIS — M25612 Stiffness of left shoulder, not elsewhere classified: Secondary | ICD-10-CM | POA: Diagnosis not present

## 2022-04-26 DIAGNOSIS — N938 Other specified abnormal uterine and vaginal bleeding: Secondary | ICD-10-CM

## 2022-04-26 NOTE — Progress Notes (Signed)
Patient presents today for endometrial biopsy and pap smear. She states her bleeding has gotten much better and now mainly spots, also reports passing tissue that looks different than blood clots. Patient has been taking Aygestin, states since taking this she has experienced more cramping. Patient states no other questions or concerns.  ?

## 2022-04-26 NOTE — Progress Notes (Signed)
HPI: ?     [name] is a 51 y.o. [id number] who LMP was No LMP recorded (lmp unknown). ? ?Subjective:  ? ?She presents today for follow-up of postmenopausal bleeding.  She was bleeding very heavily and was given Aygestin.  She reports that she is having occasional spotting but no further heavy bleeding.  She does report occasional cramping. ?Her FSH is menopausal levels. ? ?  Hx: ?The following portions of the patient's history were reviewed and updated as appropriate: ?            She  has a past medical history of Blood in urine (2014), Breast mass (2013), BV (bacterial vaginosis), Cancer ([place]), Edema, Hypertension, and Thyroid disease. ?She does not have any pertinent problems on file. ?She  has a past surgical history that includes Cesarean section (2007); Dilation and curettage of uterus; Roux-en-y procedure (03/04/2014); Breast biopsy (Right, 2013); Breast biopsy (Left, 06-12-13); Breast biopsy (Right, 06-17-13); Foot surgery (Left); and Tubal ligation. ?Her family history includes Arthritis in her mother; Cancer (age of onset: 19) in her maternal grandmother; Diabetes in her father; Hypertension in her father; Hypothyroidism in her sister; Polycystic ovary syndrome in her sister. ?She  reports that she has quit smoking. She has never used smokeless tobacco. She reports current alcohol use. She reports that she does not use drugs. ?She has a current medication list which includes the following prescription(s): hydrochlorothiazide, multivitamins with iron, norethindrone, wegovy, tamoxifen, and valacyclovir. ?She is allergic to tape, avelox [moxifloxacin hcl in nacl], and chlorhexidine. ?      ?Review of Systems:  ?Review of Systems ? ?Constitutional: Denied constitutional symptoms, night sweats, recent illness, fatigue, fever, insomnia and weight loss.  ?Eyes: Denied eye symptoms, eye pain, photophobia, vision change and visual disturbance.  ?Ears/Nose/Throat/Neck: Denied ear, nose, throat or neck  symptoms, hearing loss, nasal discharge, sinus congestion and sore throat.  ?Cardiovascular: Denied cardiovascular symptoms, arrhythmia, chest pain/pressure, edema, exercise intolerance, orthopnea and palpitations.  ?Respiratory: Denied pulmonary symptoms, asthma, pleuritic pain, productive sputum, cough, dyspnea and wheezing.  ?Gastrointestinal: Denied, gastro-esophageal reflux, melena, nausea and vomiting.  ?Genitourinary: See HPI for additional information.  ?Musculoskeletal: Denied musculoskeletal symptoms, stiffness, swelling, muscle weakness and myalgia.  ?Dermatologic: Denied dermatology symptoms, rash and scar.  ?Neurologic: Denied neurology symptoms, dizziness, headache, neck pain and syncope.  ?Psychiatric: Denied psychiatric symptoms, anxiety and depression.  ?Endocrine: Denied endocrine symptoms including hot flashes and night sweats.  ? ?Meds: ?  ?Current Outpatient Medications on File Prior to Visit  ?Medication Sig Dispense Refill  ? hydrochlorothiazide (HYDRODIURIL) 25 MG tablet Take 1 tablet (25 mg total) by mouth daily. 90 tablet 3  ? Multiple Vitamins-Iron (MULTIVITAMINS WITH IRON) TABS tablet Take 1 tablet by mouth daily.    ? norethindrone (AYGESTIN) 5 MG tablet Take 1 tablet (5 mg total) by mouth 3 (three) times daily for 21 days. As directed 60 tablet 0  ? Semaglutide-Weight Management (WEGOVY) 2.4 MG/0.75ML SOAJ Inject 2.4 mg into the skin once a week.    ? tamoxifen (NOLVADEX) 20 MG tablet Take 20 mg by mouth daily.    ? valACYclovir (VALTREX) 500 MG tablet Take 500 mg by mouth 2 (two) times daily.    ? ?No current facility-administered medications on file prior to visit.  ? ? ? ? ?Objective:  ?  ? ?Vitals:  ? 04/26/22 1041  ?BP: 106/71  ?Pulse: 88  ? ?Filed Weights  ? 04/26/22 1041  ?Weight: 214 lb 3.2 oz (97.2 kg)  ? ?  ?  Physical examination ?  Pelvic:   ?Vulva: Normal appearance.  No lesions.  ?Vagina: No lesions or abnormalities noted.  ?Support: Third-degree cystocele  second-degree uterine prolapse second-degree rectocele  ?Urethra No masses tenderness or scarring.  ?Meatus Normal size without lesions or prolapse.  ?Cervix: Normal appearance.  Endocervical polyp  ?Anus: Normal exam.  No lesions.  ?Perineum: Normal exam.  No lesions.  ? ?Endometrial Biopsy ?After discussion with the patient regarding her abnormal uterine bleeding I recommended that she proceed with an endometrial biopsy for further diagnosis. The risks, benefits, alternatives, and indications for an endometrial biopsy were discussed with the patient in detail. She understood the risks including infection, bleeding, cervical laceration and uterine perforation.  Verbal consent was obtained.  ? ?PROCEDURE NOTE:  Vacurette endometrial biopsy was performed using aseptic technique with iodine preparation.  ?The uterus was sounded to a length of 11 cm.  Adequate sampling was obtained with minimal blood loss.  The patient tolerated the procedure well.  Disposition will be pending pathology. ?    Endocervical polypectomy was performed using ring forceps. ?    Monsel solution applied.    ? ?Assessment:  ?  ?[id number] ?Patient Active Problem List  ? Diagnosis Date Noted  ? Concentration deficit 07/14/2021  ? Malignant neoplasm of overlapping sites of left breast in female, estrogen receptor positive ([place]) 02/20/2020  ? SOB (shortness of breath) 09/04/2019  ? Otitis media 08/30/2019  ? Suspected COVID-19 virus infection 08/29/2019  ? Leg swelling 09/03/2018  ? Acute sinusitis 06/15/2017  ? Climacteric 03/30/2017  ? Varicose veins with pain 11/17/2015  ? Hearing loss 05/25/2015  ? Hair loss 11/17/2014  ? Status post tubal ligation 09/22/2014  ? Routine general medical examination at a health care facility 05/15/2013  ? Goiter, nontoxic, multinodular 02/25/2013  ? Pes planus 11/20/2012  ? Obesity 11/12/2012  ? Hypertension 05/09/2012  ? ?  ?1. Postmenopausal bleeding   ?2. Menorrhagia with irregular cycle   ?3. Screening for  cervical cancer   ?4. Endocervical polyp   ? ? Endocervical polyp may well be her source of postmenopausal bleeding. ? ? ?Plan:  ?  ?       ? 1.  Await endometrial biopsy results to decide management. ?  ?Orders ?No orders of the defined types were placed in this encounter. ? ? No orders of the defined types were placed in this encounter. ?  ?  F/U ? Return for We will contact her with any abnormal test results. ?I spent 24 minutes involved in the care of this patient preparing to see the patient by obtaining and reviewing her medical history (including labs, imaging tests and prior procedures), documenting clinical information in the electronic health record (EHR), counseling and coordinating care plans, writing and sending prescriptions, ordering tests or procedures and in direct communicating with the patient and medical staff discussing pertinent items from her history and physical exam. ? ?Finis Bud, M.D. ?04/26/2022 ?12:18 PM ? ? ? ? ?

## 2022-04-27 ENCOUNTER — Telehealth (INDEPENDENT_AMBULATORY_CARE_PROVIDER_SITE_OTHER): Payer: Self-pay | Admitting: Vascular Surgery

## 2022-04-27 DIAGNOSIS — Z6834 Body mass index (BMI) 34.0-34.9, adult: Secondary | ICD-10-CM | POA: Diagnosis not present

## 2022-04-27 DIAGNOSIS — E669 Obesity, unspecified: Secondary | ICD-10-CM | POA: Diagnosis not present

## 2022-04-27 DIAGNOSIS — Z713 Dietary counseling and surveillance: Secondary | ICD-10-CM | POA: Diagnosis not present

## 2022-04-27 DIAGNOSIS — Z9884 Bariatric surgery status: Secondary | ICD-10-CM | POA: Diagnosis not present

## 2022-04-27 LAB — SURGICAL PATHOLOGY

## 2022-04-27 NOTE — Telephone Encounter (Signed)
Could someone please send in a RX of Xanax (for laser procedure for tomorrow, Apr 29, 2022 with Dr. Delana Meyer) to [place]. ?

## 2022-04-28 ENCOUNTER — Ambulatory Visit (INDEPENDENT_AMBULATORY_CARE_PROVIDER_SITE_OTHER): Payer: BC Managed Care – PPO | Admitting: Vascular Surgery

## 2022-04-28 ENCOUNTER — Encounter (INDEPENDENT_AMBULATORY_CARE_PROVIDER_SITE_OTHER): Payer: Self-pay | Admitting: Vascular Surgery

## 2022-04-28 VITALS — BP 107/70 | HR 102 | Resp 16 | Wt 212.0 lb

## 2022-04-28 DIAGNOSIS — I83819 Varicose veins of unspecified lower extremities with pain: Secondary | ICD-10-CM | POA: Diagnosis not present

## 2022-04-28 DIAGNOSIS — I83811 Varicose veins of right lower extremities with pain: Secondary | ICD-10-CM | POA: Diagnosis not present

## 2022-04-28 LAB — CYTOLOGY - PAP
Comment: NEGATIVE
Diagnosis: NEGATIVE
High risk HPV: NEGATIVE

## 2022-04-28 NOTE — Progress Notes (Signed)
? ? ?  MRN : [id number] ? ?[name] is a 51 y.o. (Feb 20, 1971) female who presents with chief complaint of No chief complaint on file. ?. ? ? ? ?The patient's right lower extremity was sterilely prepped and draped.  The ultrasound machine was used to visualize the right great saphenous vein throughout its course.  A segment below the knee was selected for access.  The saphenous vein was accessed without difficulty using ultrasound guidance with a micropuncture needle.   An 0.018  wire was placed beyond the saphenofemoral junction through the sheath and the microneedle was removed.  The 65 cm sheath was then placed over the wire and the wire and dilator were removed.  The laser fiber was placed through the sheath and its tip was placed approximately 2 cm below the saphenofemoral junction.  Tumescent anesthesia was then created with a dilute lidocaine solution.  Laser energy was then delivered with constant withdrawal of the sheath and laser fiber.  Approximately 1976 Joules of energy were delivered over a length of 44 cm.  Sterile dressings were placed.  The patient tolerated the procedure well without complications.  ?

## 2022-04-29 ENCOUNTER — Telehealth: Payer: Self-pay | Admitting: Obstetrics and Gynecology

## 2022-04-29 ENCOUNTER — Encounter: Payer: Self-pay | Admitting: Obstetrics and Gynecology

## 2022-04-29 NOTE — Telephone Encounter (Signed)
Reached out to patient via previous MyChart message ?

## 2022-04-29 NOTE — Telephone Encounter (Signed)
Spoke with patient regarding concerns. Patient reports heavy bleeding starting yesterday. Inquired about any medications taken, states she took tamoxifen, advised to try Aygestin. She stated a pain level that goes from 5-8, advised to take tylenol or ibuprofen as needed. We discussed going to the ED but she takes she is going to try Aygestin and then if the bleeding does not get better she will go to the ED.  ?

## 2022-04-29 NOTE — Telephone Encounter (Signed)
Pt states " she has had bleeding that began at 4:00pm 05/11. Pt states " she is changing her pad every 20 to 30 mins". Pt states "she is in pain".  Pt is taking RX  to stop the bleeding but it hasn't subsided. Please advise ?

## 2022-04-30 ENCOUNTER — Encounter (INDEPENDENT_AMBULATORY_CARE_PROVIDER_SITE_OTHER): Payer: Self-pay | Admitting: Vascular Surgery

## 2022-05-03 ENCOUNTER — Other Ambulatory Visit (INDEPENDENT_AMBULATORY_CARE_PROVIDER_SITE_OTHER): Payer: Self-pay | Admitting: Vascular Surgery

## 2022-05-03 DIAGNOSIS — I83819 Varicose veins of unspecified lower extremities with pain: Secondary | ICD-10-CM

## 2022-05-04 ENCOUNTER — Ambulatory Visit (INDEPENDENT_AMBULATORY_CARE_PROVIDER_SITE_OTHER): Payer: BC Managed Care – PPO | Admitting: Family

## 2022-05-04 ENCOUNTER — Other Ambulatory Visit: Payer: Self-pay

## 2022-05-04 ENCOUNTER — Other Ambulatory Visit
Admission: RE | Admit: 2022-05-04 | Discharge: 2022-05-04 | Disposition: A | Discharge status: Home / Self Care | Payer: BC Managed Care – PPO | Attending: Family | Admitting: Family

## 2022-05-04 ENCOUNTER — Encounter: Payer: Self-pay | Admitting: Family

## 2022-05-04 ENCOUNTER — Telehealth: Payer: Self-pay

## 2022-05-04 VITALS — BP 120/72 | HR 100 | Temp 98.7°F | Ht 65.0 in | Wt 214.0 lb

## 2022-05-04 DIAGNOSIS — Z1211 Encounter for screening for malignant neoplasm of colon: Secondary | ICD-10-CM | POA: Diagnosis not present

## 2022-05-04 DIAGNOSIS — Z23 Encounter for immunization: Secondary | ICD-10-CM

## 2022-05-04 DIAGNOSIS — N939 Abnormal uterine and vaginal bleeding, unspecified: Secondary | ICD-10-CM | POA: Diagnosis not present

## 2022-05-04 DIAGNOSIS — I10 Essential (primary) hypertension: Secondary | ICD-10-CM | POA: Diagnosis not present

## 2022-05-04 LAB — CBC WITH DIFFERENTIAL/PLATELET
Abs Immature Granulocytes: 0.02 10*3/uL (ref 0.00–0.07)
Basophils Absolute: 0.1 10*3/uL (ref 0.0–0.1)
Basophils Relative: 1 %
Eosinophils Absolute: 0.2 10*3/uL (ref 0.0–0.5)
Eosinophils Relative: 3 %
HCT: 31.2 % — ABNORMAL LOW (ref 36.0–46.0)
Hemoglobin: 10.1 g/dL — ABNORMAL LOW (ref 12.0–15.0)
Immature Granulocytes: 0 %
Lymphocytes Relative: 42 %
Lymphs Abs: 2.7 10*3/uL (ref 0.7–4.0)
MCH: 28.9 pg (ref 26.0–34.0)
MCHC: 32.4 g/dL (ref 30.0–36.0)
MCV: 89.4 fL (ref 80.0–100.0)
Monocytes Absolute: 0.4 10*3/uL (ref 0.1–1.0)
Monocytes Relative: 6 %
Neutro Abs: 3.2 10*3/uL (ref 1.7–7.7)
Neutrophils Relative %: 48 %
Platelets: 264 10*3/uL (ref 150–400)
RBC: 3.49 MIL/uL — ABNORMAL LOW (ref 3.87–5.11)
RDW: 13.2 % (ref 11.5–15.5)
WBC: 6.6 10*3/uL (ref 4.0–10.5)
nRBC: 0 % (ref 0.0–0.2)

## 2022-05-04 LAB — IRON AND TIBC
Iron: 23 ug/dL — ABNORMAL LOW (ref 28–170)
Saturation Ratios: 5 % — ABNORMAL LOW (ref 10.4–31.8)
TIBC: 476 ug/dL — ABNORMAL HIGH (ref 250–450)
UIBC: 453 ug/dL

## 2022-05-04 LAB — FERRITIN: Ferritin: 6 ng/mL — ABNORMAL LOW (ref 11–307)

## 2022-05-04 MED ORDER — PEG 3350-KCL-NA BICARB-NACL 420 G PO SOLR: 4000.0000 mL | Freq: Once | ORAL | 0 refills | Status: AC

## 2022-05-04 NOTE — Telephone Encounter (Signed)
-----   Message from Elwin Sleight sent at 05/04/2022  2:18 PM EDT ----- ?Please return call [phone]  ? ?

## 2022-05-04 NOTE — Assessment & Plan Note (Addendum)
Heavy bleeding has resumed. She will continue aygestin.   I was able to reach out to Dr. Amalia Hailey office, and Lovena Le ( [place])  scheduled appointment for her to be seen tomorrow morning to discuss treatment/ IUD. Pending stat CBC, ferritin. Advised her if she were to feel persistently lightheaded or develop SOB ahead of GYN appointment, she would need to go to go ED for in person evaluation. She verbalized understanding.  ?

## 2022-05-04 NOTE — Progress Notes (Signed)
Gastroenterology Pre-Procedure Review ? ?Request Date:06/16/2022 ?Requesting Physician: Dr. Marius Ditch ? ?PATIENT REVIEW QUESTIONS: The patient responded to the following health history questions as indicated:   ? ?1. Are you having any GI issues? no ?2. Do you have a personal history of Polyps? no ?3. Do you have a family history of Colon Cancer or Polyps? no ?4. Diabetes Mellitus? no ?5. Joint replacements in the past 12 months?no ?6. Major health problems in the past 3 months?yes (gynocology ) ?7. Any artificial heart valves, MVP, or defibrillator?no ?   ?MEDICATIONS & ALLERGIES:    ?Patient reports the following regarding taking any anticoagulation/antiplatelet therapy:   ?Plavix, Coumadin, Eliquis, Xarelto, Lovenox, Pradaxa, Brilinta, or Effient? no ?Aspirin? no ? ?Patient confirms/reports the following medications:  ?Current Outpatient Medications  ?Medication Sig Dispense Refill  ? hydrochlorothiazide (HYDRODIURIL) 25 MG tablet Take 1 tablet (25 mg total) by mouth daily. 90 tablet 3  ? Multiple Vitamins-Iron (MULTIVITAMINS WITH IRON) TABS tablet Take 1 tablet by mouth daily.    ? norethindrone (AYGESTIN) 5 MG tablet Take 1 tablet (5 mg total) by mouth 3 (three) times daily for 21 days. As directed 60 tablet 0  ? Semaglutide-Weight Management (WEGOVY) 2.4 MG/0.75ML SOAJ Inject 2.4 mg into the skin once a week.    ? tamoxifen (NOLVADEX) 20 MG tablet Take 20 mg by mouth daily.    ? valACYclovir (VALTREX) 500 MG tablet Take 500 mg by mouth 2 (two) times daily.    ? ?No current facility-administered medications for this visit.  ? ? ?Patient confirms/reports the following allergies:  ?Allergies  ?Allergen Reactions  ? Tape Other (See Comments)  ?  Blistering, ok with paper tape  ? Avelox [Moxifloxacin Hcl In Nacl] Palpitations  ? Chlorhexidine Itching  ?  Chg wipes cause severe itching  ? ? ?No orders of the defined types were placed in this encounter. ? ? ?AUTHORIZATION INFORMATION ?Primary Insurance: ?1D#: ?Group  #: ? ?Secondary Insurance: ?1D#: ?Group #: ? ?SCHEDULE INFORMATION: ?Date: 06/16/2022 ?Time: ?Location:[facility] ? ?

## 2022-05-04 NOTE — Patient Instructions (Addendum)
Please let me know how it goes with Dr Amalia Hailey tomorrow morning.  ? ?Please go directly to [facility] today.  ? ? ?I will certainly call you today if hemoglobin warrants emergency room visit, blood  transfusion as discussed. ? ?Referral for colonoscopy has been placed ? ?Let us know if you dont hear back within a week in regards to an appointment being scheduled. ' ?

## 2022-05-04 NOTE — Telephone Encounter (Signed)
Spoke with patient after discussion with Dr. Amalia Hailey. Advised patient to come in for appointment and discussions will be had regarding IUD or other options regarding her bleeding. Patient stated understanding.  ?

## 2022-05-04 NOTE — Assessment & Plan Note (Signed)
Excellent control.  Currently holding hydrochlorothiazide with heavy menses at this time.  I agree with this.  Advised her not to resume hydrochlorothiazide until dizziness resolved and she has been seen by GYN exam as concerned that hydrochlorothiazide could further exacerbate her symptoms. ?

## 2022-05-04 NOTE — Progress Notes (Signed)
? ?Subjective:  ? ? Patient ID: [name], female    DOB: 23-Feb-1971, 51 y.o.   MRN: [id number] ? ?CC: SHEALYN Jimenez is a 51 y.o. female who presents today for follow up.  ? ?HPI: She continues to have vaginal bleeding ? ?She is compliant with iron ( elemental iron) '45mg'$  ?She is compliant with aygestin ?Following with Dr Amalia Hailey for heavy menses.  Previously discussed IUD. She is compliant with She states last night heavy bleeding resumed with large clots.  She does feel dizzy.  No syncopal episodes. ? ?Endometrial biopsy negative for malignancy 04/26/2022. ?[place] [facility] indicated postmenopause ? ?Obesity- she has lost 100lbs since 2015. She is compliant with wegovy 2.'4mg'$ .  Very pleased with medication ? ?Hypertension-compliant with hydrochlorothiazide 25 mg however she has taken hctz '25mg'$  since heavy bleeding resumed.  ? ?HISTORY:  ?Past Medical History:  ?Diagnosis Date  ? Blood in urine 2014  ? Breast mass 2013  ? BV (bacterial vaginosis)   ? Cancer [facility])   ? Breast Jan 2021  ? Edema   ? legs  ? Hypertension   ? Thyroid disease   ? ?Past Surgical History:  ?Procedure Laterality Date  ? BREAST BIOPSY Right 2013  ? benign - fatty tissue  ? BREAST BIOPSY Left 06-12-13  ? fibroadenoma /core biopsy  ? BREAST BIOPSY Right 06-17-13  ? fibrocystic /stereo biopsy  ? CESAREAN SECTION  2007  ? DILATION AND CURETTAGE OF UTERUS    ? FOOT SURGERY Left   ? 01/2017  ? ROUX-EN-Y PROCEDURE  03/04/2014  ? Dr. [place]  ? TUBAL LIGATION    ? ?Family History  ?Problem Relation Age of Onset  ? Arthritis Mother   ? Diabetes Father   ? Hypertension Father   ? Polycystic ovary syndrome Sister   ? Hypothyroidism Sister   ? Cancer Maternal Grandmother 2  ?     breast  ? Ovarian cancer Neg Hx   ? Colon cancer Neg Hx   ? ? ?Allergies: Tape, Avelox [moxifloxacin hcl in nacl], and Chlorhexidine ?Current Outpatient Medications on File Prior to Visit  ?Medication Sig Dispense Refill  ? hydrochlorothiazide (HYDRODIURIL) 25 MG tablet Take 1 tablet (25 mg  total) by mouth daily. 90 tablet 3  ? Multiple Vitamins-Iron (MULTIVITAMINS WITH IRON) TABS tablet Take 1 tablet by mouth daily.    ? Semaglutide-Weight Management (WEGOVY) 2.4 MG/0.75ML SOAJ Inject 2.4 mg into the skin once a week.    ? tamoxifen (NOLVADEX) 20 MG tablet Take 20 mg by mouth daily.    ? valACYclovir (VALTREX) 500 MG tablet Take 500 mg by mouth 2 (two) times daily.    ? norethindrone (AYGESTIN) 5 MG tablet Take 1 tablet (5 mg total) by mouth 3 (three) times daily for 21 days. As directed 60 tablet 0  ? ?No current facility-administered medications on file prior to visit.  ? ? ?Social History  ? ?Tobacco Use  ? Smoking status: Former  ? Smokeless tobacco: Never  ? Tobacco comments:  ?  quit 2006  ?Vaping Use  ? Vaping Use: Never used  ?Substance Use Topics  ? Alcohol use: Yes  ?  Comment: rare  ? Drug use: No  ? ? ?Review of Systems  ?Constitutional:  Negative for chills and fever.  ?Respiratory:  Negative for cough.   ?Cardiovascular:  Negative for chest pain and palpitations.  ?Gastrointestinal:  Negative for nausea and vomiting.  ?Genitourinary:  Positive for vaginal bleeding.  ?Neurological:  Positive for  dizziness. Negative for syncope.  ?   ?Objective:  ?  ?BP 120/72 (BP Location: Right Arm, Patient Position: Sitting, Cuff Size: Normal)   Pulse 100   Temp 98.7 ?F (37.1 ?C) (Oral)   Ht '5\' 5"'$  (1.651 m)   Wt 214 lb (97.1 kg)   LMP  (LMP Unknown) Comment: bleeding since beginning early 2023  SpO2 98%   BMI 35.61 kg/m?  ?BP Readings from Last 3 Encounters:  ?05/04/22 120/72  ?04/28/22 107/70  ?04/26/22 106/71  ? ?Wt Readings from Last 3 Encounters:  ?05/04/22 214 lb (97.1 kg)  ?04/28/22 212 lb (96.2 kg)  ?04/26/22 214 lb 3.2 oz (97.2 kg)  ? ? ?Physical Exam ?Vitals reviewed.  ?Constitutional:   ?   Appearance: She is well-developed.  ?Eyes:  ?   Conjunctiva/sclera: Conjunctivae normal.  ?Cardiovascular:  ?   Rate and Rhythm: Normal rate and regular rhythm.  ?   Pulses: Normal pulses.  ?   Heart  sounds: Normal heart sounds.  ?Pulmonary:  ?   Effort: Pulmonary effort is normal.  ?   Breath sounds: Normal breath sounds. No wheezing, rhonchi or rales.  ?Skin: ?   General: Skin is warm and dry.  ?Neurological:  ?   Mental Status: She is alert.  ?Psychiatric:     ?   Speech: Speech normal.     ?   Behavior: Behavior normal.     ?   Thought Content: Thought content normal.  ? ? ?   ?Assessment & Plan:  ? ?Problem List Items Addressed This Visit   ? ?  ? Cardiovascular and Mediastinum  ? Hypertension  ?  Excellent control.  Currently holding hydrochlorothiazide with heavy menses at this time.  I agree with this.  Advised her not to resume hydrochlorothiazide until dizziness resolved and she has been seen by GYN exam as concerned that hydrochlorothiazide could further exacerbate her symptoms. ? ?  ?  ?  ? Genitourinary  ? Abnormal uterine bleeding (AUB) - Primary  ?  Heavy bleeding has resumed. She will continue aygestin.   I was able to reach out to Dr. Amalia Hailey office, and Lovena Le ( [place])  scheduled appointment for her to be seen tomorrow morning to discuss treatment/ IUD. Pending stat CBC, ferritin. Advised her if she were to feel persistently lightheaded or develop SOB ahead of GYN appointment, she would need to go to go ED for in person evaluation. She verbalized understanding.  ? ?  ?  ? Relevant Orders  ? CBC with Differential/Platelet  ? Ferritin  ? Iron and Iron Binding Capacity ([facility] only)  ? ?Other Visit Diagnoses   ? ? Screen for colon cancer      ? Relevant Orders  ? Ambulatory referral to Gastroenterology  ? ?  ? ? ? ?I am having Tamaya Pun. Ivancic maintain her tamoxifen, multivitamins with iron, Wegovy, hydrochlorothiazide, valACYclovir, and norethindrone. ? ? ?No orders of the defined types were placed in this encounter. ? ? ?Return precautions given.  ? ?Risks, benefits, and alternatives of the medications and treatment plan prescribed today were discussed, and patient expressed understanding.   ? ?Education regarding symptom management and diagnosis given to patient on AVS. ? ?Continue to follow with Burnard Hawthorne, FNP for routine health maintenance.  ? ?Ladoris Gene and I agreed with plan.  ? ?Mable Paris, FNP ? ? ?

## 2022-05-05 ENCOUNTER — Ambulatory Visit: Payer: BC Managed Care – PPO | Admitting: Obstetrics and Gynecology

## 2022-05-05 ENCOUNTER — Encounter: Payer: Self-pay | Admitting: Obstetrics and Gynecology

## 2022-05-05 ENCOUNTER — Ambulatory Visit (INDEPENDENT_AMBULATORY_CARE_PROVIDER_SITE_OTHER): Payer: BC Managed Care – PPO

## 2022-05-05 ENCOUNTER — Other Ambulatory Visit: Payer: Self-pay

## 2022-05-05 ENCOUNTER — Encounter: Payer: Self-pay | Admitting: Family

## 2022-05-05 DIAGNOSIS — I83819 Varicose veins of unspecified lower extremities with pain: Secondary | ICD-10-CM | POA: Diagnosis not present

## 2022-05-05 MED ORDER — VALACYCLOVIR HCL 500 MG PO TABS: 500.0000 mg | ORAL_TABLET | Freq: Two times a day (BID) | ORAL | 3 refills | Status: DC

## 2022-05-05 NOTE — Progress Notes (Unsigned)
Patient presents today for abnormal bleeding. She states heavy bleeding with constant clotting since 5/11 after having polyp removed. She states taking Aygestin but bleeding in persisting. Patient states curiosity of IUD vs hysterectomy at this time due to prolapse. No other concerns at this time.  Patient left prior to being seen by MD due to emergent surgery

## 2022-05-10 ENCOUNTER — Ambulatory Visit (INDEPENDENT_AMBULATORY_CARE_PROVIDER_SITE_OTHER): Payer: BC Managed Care – PPO | Admitting: Obstetrics and Gynecology

## 2022-05-10 ENCOUNTER — Encounter: Payer: Self-pay | Admitting: Obstetrics and Gynecology

## 2022-05-10 VITALS — BP 109/73 | HR 83 | Ht 65.0 in | Wt 215.2 lb

## 2022-05-10 DIAGNOSIS — N950 Postmenopausal bleeding: Secondary | ICD-10-CM | POA: Diagnosis not present

## 2022-05-10 DIAGNOSIS — N939 Abnormal uterine and vaginal bleeding, unspecified: Secondary | ICD-10-CM | POA: Diagnosis not present

## 2022-05-10 MED ORDER — TRANEXAMIC ACID 650 MG PO TABS: 1300.0000 mg | ORAL_TABLET | Freq: Three times a day (TID) | ORAL | 2 refills | Status: DC

## 2022-05-10 NOTE — Progress Notes (Unsigned)
Patient presents today for lengthy abnormal bleeding. She recently had an endometrial biopsy and polyp removal, has been bleeding since. She reports heavy bleeding with a lot of clots. Patient has been taking Aygestin with no ease. She states concerns of IUD stopping bleeding vs. Hysterectomy. No other questions or concerns at this time.

## 2022-05-11 ENCOUNTER — Encounter: Payer: Self-pay | Admitting: Obstetrics and Gynecology

## 2022-05-11 ENCOUNTER — Other Ambulatory Visit: Payer: Self-pay | Admitting: Obstetrics and Gynecology

## 2022-05-11 NOTE — Progress Notes (Signed)
HPI:      [name] is a 51 y.o. [id number] who LMP was No LMP recorded (lmp unknown).  Subjective:   She presents today continuing to have vaginal bleeding that is rather heavy.  This has continued since her last visit when she underwent endocervical polypectomy. Of significant note patient has a history of breast cancer and has been on tamoxifen.    Hx: The following portions of the patient's history were reviewed and updated as appropriate:             She  has a past medical history of Blood in urine (2014), Breast mass (2013), BV (bacterial vaginosis), Cancer ([place]), Edema, Hypertension, and Thyroid disease. She does not have any pertinent problems on file. She  has a past surgical history that includes Cesarean section (2007); Dilation and curettage of uterus; Roux-en-y procedure (03/04/2014); Breast biopsy (Right, 2013); Breast biopsy (Left, 06-12-13); Breast biopsy (Right, 06-17-13); Foot surgery (Left); and Tubal ligation. Her family history includes Arthritis in her mother; Cancer (age of onset: 18) in her maternal grandmother; Diabetes in her father; Hypertension in her father; Hypothyroidism in her sister; Polycystic ovary syndrome in her sister. She  reports that she has quit smoking. She has never used smokeless tobacco. She reports current alcohol use. She reports that she does not use drugs. She has a current medication list which includes the following prescription(s): hydrochlorothiazide, multivitamins with iron, wegovy, tamoxifen, tranexamic acid, valacyclovir, and norethindrone. She is allergic to tape, avelox [moxifloxacin hcl in nacl], and chlorhexidine.       Review of Systems:  Review of Systems  Constitutional: Denied constitutional symptoms, night sweats, recent illness, fatigue, fever, insomnia and weight loss.  Eyes: Denied eye symptoms, eye pain, photophobia, vision change and visual disturbance.  Ears/Nose/Throat/Neck: Denied ear, nose, throat or neck  symptoms, hearing loss, nasal discharge, sinus congestion and sore throat.  Cardiovascular: Denied cardiovascular symptoms, arrhythmia, chest pain/pressure, edema, exercise intolerance, orthopnea and palpitations.  Respiratory: Denied pulmonary symptoms, asthma, pleuritic pain, productive sputum, cough, dyspnea and wheezing.  Gastrointestinal: Denied, gastro-esophageal reflux, melena, nausea and vomiting.  Genitourinary: See HPI for additional information.  Musculoskeletal: Denied musculoskeletal symptoms, stiffness, swelling, muscle weakness and myalgia.  Dermatologic: Denied dermatology symptoms, rash and scar.  Neurologic: Denied neurology symptoms, dizziness, headache, neck pain and syncope.  Psychiatric: Denied psychiatric symptoms, anxiety and depression.  Endocrine: Denied endocrine symptoms including hot flashes and night sweats.   Meds:   Current Outpatient Medications on File Prior to Visit  Medication Sig Dispense Refill   hydrochlorothiazide (HYDRODIURIL) 25 MG tablet Take 1 tablet (25 mg total) by mouth daily. 90 tablet 3   Multiple Vitamins-Iron (MULTIVITAMINS WITH IRON) TABS tablet Take 1 tablet by mouth daily.     Semaglutide-Weight Management (WEGOVY) 2.4 MG/0.75ML SOAJ Inject 2.4 mg into the skin once a week.     tamoxifen (NOLVADEX) 20 MG tablet Take 20 mg by mouth daily.     valACYclovir (VALTREX) 500 MG tablet Take 1 tablet (500 mg total) by mouth 2 (two) times daily. 60 tablet 3   norethindrone (AYGESTIN) 5 MG tablet Take 1 tablet (5 mg total) by mouth 3 (three) times daily for 21 days. As directed 60 tablet 0   No current facility-administered medications on file prior to visit.      Objective:     Vitals:   05/10/22 1144  BP: 109/73  Pulse: 83   Filed Weights   05/10/22 1144  Weight: 215 lb 3.2 oz (  97.6 kg)              Physical examination   Pelvic:   Vulva: Normal appearance.  No lesions.  Vagina: No lesions or abnormalities noted.  Support:  Normal pelvic support.  Urethra No masses tenderness or scarring.  Meatus Normal size without lesions or prolapse.  Cervix: Normal appearance.  No lesions.  Anus: Normal exam.  No lesions.  Perineum: Normal exam.  No lesions.        Bimanual   Uterus: Normal size.  Non-tender.  Mobile.  AV.  Adnexae: No masses.  Non-tender to palpation.  Cul-de-sac: Negative for abnormality.   No evidence of bleeding from the polyp site.  Bleeding appears to be coming from the endometrial cavity.          Assessment:    [id number] Patient Active Problem List   Diagnosis Date Noted   Concentration deficit 07/14/2021   Malignant neoplasm of overlapping sites of left breast in female, estrogen receptor positive ([place]) 02/20/2020   SOB (shortness of breath) 09/04/2019   Otitis media 08/30/2019   Suspected COVID-19 virus infection 08/29/2019   Leg swelling 09/03/2018   Acute sinusitis 06/15/2017   Climacteric 03/30/2017   Abnormal uterine bleeding (AUB) 03/30/2017   Varicose veins with pain 11/17/2015   Hearing loss 05/25/2015   Hair loss 11/17/2014   Status post tubal ligation 09/22/2014   Routine general medical examination at a health care facility 05/15/2013   Goiter, nontoxic, multinodular 02/25/2013   Pes planus 11/20/2012   Obesity 11/12/2012   Hypertension 05/09/2012     1. Abnormal uterine bleeding (AUB)   2. Postmenopausal bleeding     Continued vaginal bleeding.  Bleeding is somewhat of a concern for endometrial polyp or hyperplasia because the patient has been on tamoxifen. Unable to give estrogen compounds at this time to control bleeding.   Plan:            1.  Increase Aygestin  2.  Add TXA  3.  Ultrasound for endometrial thickness and check for polyps  4.  Consider future endometrial biopsy Orders Orders Placed This Encounter  Procedures   US PELVIS (TRANSABDOMINAL ONLY)   US PELVIS TRANSVAGINAL NON-OB (TV ONLY)     Meds ordered this encounter  Medications    tranexamic acid (LYSTEDA) 650 MG TABS tablet    Sig: Take 2 tablets (1,300 mg total) by mouth 3 (three) times daily for 21 days. Take during menses for a maximum of five days    Dispense:  30 tablet    Refill:  2      F/U  Return for We will contact her with any abnormal test results. I spent 25 minutes involved in the care of this patient preparing to see the patient by obtaining and reviewing her medical history (including labs, imaging tests and prior procedures), documenting clinical information in the electronic health record (EHR), counseling and coordinating care plans, writing and sending prescriptions, ordering tests or procedures and in direct communicating with the patient and medical staff discussing pertinent items from her history and physical exam.  Finis Bud, M.D. 05/11/2022 4:54 PM

## 2022-05-13 ENCOUNTER — Other Ambulatory Visit: Payer: Self-pay | Admitting: Obstetrics and Gynecology

## 2022-05-13 DIAGNOSIS — N938 Other specified abnormal uterine and vaginal bleeding: Secondary | ICD-10-CM

## 2022-05-13 MED ORDER — NORETHINDRONE ACETATE 5 MG PO TABS: 5.0000 mg | ORAL_TABLET | Freq: Three times a day (TID) | ORAL | 0 refills | Status: DC

## 2022-05-15 ENCOUNTER — Emergency Department: Payer: BC Managed Care – PPO

## 2022-05-15 ENCOUNTER — Encounter: Payer: Self-pay | Admitting: Intensive Care

## 2022-05-15 ENCOUNTER — Emergency Department: Payer: BC Managed Care – PPO | Admitting: Anesthesiology

## 2022-05-15 ENCOUNTER — Encounter
Admission: EM | Disposition: A | Discharge status: Home / Self Care | Payer: Self-pay | Source: Home / Self Care | Attending: Emergency Medicine

## 2022-05-15 ENCOUNTER — Other Ambulatory Visit: Payer: Self-pay

## 2022-05-15 ENCOUNTER — Ambulatory Visit
Admission: EM | Admit: 2022-05-15 | Discharge: 2022-05-15 | Disposition: A | Discharge status: Home / Self Care | Payer: BC Managed Care – PPO | Attending: Emergency Medicine | Admitting: Emergency Medicine

## 2022-05-15 DIAGNOSIS — Z87891 Personal history of nicotine dependence: Secondary | ICD-10-CM | POA: Diagnosis not present

## 2022-05-15 DIAGNOSIS — R9389 Abnormal findings on diagnostic imaging of other specified body structures: Secondary | ICD-10-CM | POA: Diagnosis not present

## 2022-05-15 DIAGNOSIS — N939 Abnormal uterine and vaginal bleeding, unspecified: Secondary | ICD-10-CM | POA: Diagnosis not present

## 2022-05-15 DIAGNOSIS — Z780 Asymptomatic menopausal state: Secondary | ICD-10-CM | POA: Diagnosis not present

## 2022-05-15 DIAGNOSIS — N840 Polyp of corpus uteri: Secondary | ICD-10-CM | POA: Diagnosis not present

## 2022-05-15 DIAGNOSIS — N921 Excessive and frequent menstruation with irregular cycle: Secondary | ICD-10-CM

## 2022-05-15 DIAGNOSIS — N950 Postmenopausal bleeding: Secondary | ICD-10-CM | POA: Diagnosis not present

## 2022-05-15 DIAGNOSIS — N8500 Endometrial hyperplasia, unspecified: Secondary | ICD-10-CM | POA: Diagnosis not present

## 2022-05-15 DIAGNOSIS — Z90710 Acquired absence of both cervix and uterus: Secondary | ICD-10-CM

## 2022-05-15 HISTORY — PX: DILATION AND CURETTAGE OF UTERUS: SHX78

## 2022-05-15 LAB — CBC
HCT: 29.9 % — ABNORMAL LOW (ref 36.0–46.0)
HCT: 28.1 % — ABNORMAL LOW (ref 36.0–46.0)
Hemoglobin: 9.4 g/dL — ABNORMAL LOW (ref 12.0–15.0)
Hemoglobin: 8.8 g/dL — ABNORMAL LOW (ref 12.0–15.0)
MCH: 28.5 pg (ref 26.0–34.0)
MCH: 28.6 pg (ref 26.0–34.0)
MCHC: 31.4 g/dL (ref 30.0–36.0)
MCHC: 31.3 g/dL (ref 30.0–36.0)
MCV: 90.6 fL (ref 80.0–100.0)
MCV: 91.2 fL (ref 80.0–100.0)
Platelets: 335 10*3/uL (ref 150–400)
Platelets: 300 10*3/uL (ref 150–400)
RBC: 3.3 MIL/uL — ABNORMAL LOW (ref 3.87–5.11)
RBC: 3.08 MIL/uL — ABNORMAL LOW (ref 3.87–5.11)
RDW: 12.5 % (ref 11.5–15.5)
RDW: 12.6 % (ref 11.5–15.5)
WBC: 6.5 10*3/uL (ref 4.0–10.5)
WBC: 7 10*3/uL (ref 4.0–10.5)
nRBC: 0 % (ref 0.0–0.2)
nRBC: 0 % (ref 0.0–0.2)

## 2022-05-15 LAB — TYPE AND SCREEN
ABO/RH(D): O POS
Antibody Screen: NEGATIVE

## 2022-05-15 LAB — URINALYSIS, ROUTINE W REFLEX MICROSCOPIC
Bacteria, UA: NONE SEEN
RBC / HPF: >50 RBC/hpf — ABNORMAL HIGH (ref 0–5)
Specific Gravity, Urine: 1.036 — ABNORMAL HIGH (ref 1.005–1.030)
Squamous Epithelial / HPF: NONE SEEN (ref 0–5)
WBC, UA: >50 WBC/hpf — ABNORMAL HIGH (ref 0–5)

## 2022-05-15 LAB — COMPREHENSIVE METABOLIC PANEL
ALT: 20 U/L (ref 0–44)
AST: 26 U/L (ref 15–41)
Albumin: 3.4 g/dL — ABNORMAL LOW (ref 3.5–5.0)
Alkaline Phosphatase: 61 U/L (ref 38–126)
Anion gap: 5 (ref 5–15)
BUN: 18 mg/dL (ref 6–20)
CO2: 25 mmol/L (ref 22–32)
Calcium: 8.4 mg/dL — ABNORMAL LOW (ref 8.9–10.3)
Chloride: 111 mmol/L (ref 98–111)
Creatinine, Ser: 0.76 mg/dL (ref 0.44–1.00)
GFR, Estimated: >60 mL/min (ref >60)
Glucose, Bld: 81 mg/dL (ref 70–99)
Potassium: 3.8 mmol/L (ref 3.5–5.1)
Sodium: 141 mmol/L (ref 135–145)
Total Bilirubin: 0.6 mg/dL (ref 0.3–1.2)
Total Protein: 6.3 g/dL — ABNORMAL LOW (ref 6.5–8.1)

## 2022-05-15 LAB — LIPASE, BLOOD: Lipase: 51 U/L (ref 11–51)

## 2022-05-15 SURGERY — DILATION AND CURETTAGE
Anesthesia: General

## 2022-05-15 MED ORDER — ONDANSETRON HCL 4 MG/2ML IJ SOLN
INTRAMUSCULAR | Status: DC | PRN
  Administered 2022-05-15: 4 mg via INTRAVENOUS

## 2022-05-15 MED ORDER — PHENYLEPHRINE HCL (PRESSORS) 10 MG/ML IV SOLN
INTRAVENOUS | Status: DC | PRN
  Administered 2022-05-15 (×2): 200 ug via INTRAVENOUS
  Administered 2022-05-15 (×2): 100 ug via INTRAVENOUS

## 2022-05-15 MED ORDER — FENTANYL CITRATE (PF) 100 MCG/2ML IJ SOLN
INTRAMUSCULAR | Status: AC
  Filled 2022-05-15: qty 2

## 2022-05-15 MED ORDER — DEXAMETHASONE SODIUM PHOSPHATE 10 MG/ML IJ SOLN
INTRAMUSCULAR | Status: DC | PRN
  Administered 2022-05-15: 10 mg via INTRAVENOUS

## 2022-05-15 MED ORDER — KETOROLAC TROMETHAMINE 30 MG/ML IJ SOLN
INTRAMUSCULAR | Status: AC
  Filled 2022-05-15: qty 1

## 2022-05-15 MED ORDER — PROPOFOL 1000 MG/100ML IV EMUL
INTRAVENOUS | Status: AC
  Filled 2022-05-15: qty 100

## 2022-05-15 MED ORDER — FENTANYL CITRATE (PF) 100 MCG/2ML IJ SOLN
25.0000 ug | INTRAMUSCULAR | Status: DC | PRN
  Administered 2022-05-15: 25 ug via INTRAVENOUS

## 2022-05-15 MED ORDER — LACTATED RINGERS IV SOLN: INTRAVENOUS | Status: DC | PRN

## 2022-05-15 MED ORDER — POVIDONE-IODINE 10 % EX SWAB
2.0000 "application " | Freq: Once | CUTANEOUS | Status: AC
  Administered 2022-05-15: 2 via TOPICAL
  Filled 2022-05-15: qty 2

## 2022-05-15 MED ORDER — LIDOCAINE HCL (CARDIAC) PF 100 MG/5ML IV SOSY
PREFILLED_SYRINGE | INTRAVENOUS | Status: DC | PRN
  Administered 2022-05-15: 100 mg via INTRAVENOUS

## 2022-05-15 MED ORDER — ONDANSETRON HCL 4 MG/2ML IJ SOLN
INTRAMUSCULAR | Status: AC
  Filled 2022-05-15: qty 2

## 2022-05-15 MED ORDER — KETOROLAC TROMETHAMINE 30 MG/ML IJ SOLN
INTRAMUSCULAR | Status: DC | PRN
  Administered 2022-05-15: 30 mg via INTRAVENOUS

## 2022-05-15 MED ORDER — MIDAZOLAM HCL 2 MG/2ML IJ SOLN
INTRAMUSCULAR | Status: DC | PRN
  Administered 2022-05-15: 2 mg via INTRAVENOUS

## 2022-05-15 MED ORDER — LIDOCAINE HCL (PF) 2 % IJ SOLN
INTRAMUSCULAR | Status: AC
  Filled 2022-05-15: qty 5

## 2022-05-15 MED ORDER — PROMETHAZINE HCL 25 MG/ML IJ SOLN: 6.2500 mg | INTRAMUSCULAR | Status: DC | PRN

## 2022-05-15 MED ORDER — SUCCINYLCHOLINE CHLORIDE 200 MG/10ML IV SOSY
PREFILLED_SYRINGE | INTRAVENOUS | Status: AC
  Filled 2022-05-15: qty 20

## 2022-05-15 MED ORDER — DOXYCYCLINE HYCLATE 100 MG PO CAPS: 100.0000 mg | ORAL_CAPSULE | Freq: Two times a day (BID) | ORAL | 0 refills | Status: AC

## 2022-05-15 MED ORDER — MIDAZOLAM HCL 2 MG/2ML IJ SOLN
INTRAMUSCULAR | Status: AC
  Filled 2022-05-15: qty 2

## 2022-05-15 MED ORDER — DEXMEDETOMIDINE HCL IN NACL 80 MCG/20ML IV SOLN
INTRAVENOUS | Status: AC
  Filled 2022-05-15: qty 20

## 2022-05-15 MED ORDER — SUCCINYLCHOLINE CHLORIDE 200 MG/10ML IV SOSY
PREFILLED_SYRINGE | INTRAVENOUS | Status: DC | PRN
  Administered 2022-05-15: 120 mg via INTRAVENOUS

## 2022-05-15 MED ORDER — LACTATED RINGERS IV SOLN: INTRAVENOUS | Status: DC

## 2022-05-15 MED ORDER — SILVER NITRATE-POT NITRATE 75-25 % EX MISC
CUTANEOUS | Status: AC
  Filled 2022-05-15: qty 10

## 2022-05-15 MED ORDER — FENTANYL CITRATE (PF) 100 MCG/2ML IJ SOLN
INTRAMUSCULAR | Status: DC | PRN
  Administered 2022-05-15 (×2): 50 ug via INTRAVENOUS

## 2022-05-15 MED ORDER — PROPOFOL 10 MG/ML IV BOLUS
INTRAVENOUS | Status: DC | PRN
  Administered 2022-05-15: 150 mg via INTRAVENOUS

## 2022-05-15 MED ORDER — DEXAMETHASONE SODIUM PHOSPHATE 10 MG/ML IJ SOLN
INTRAMUSCULAR | Status: AC
  Filled 2022-05-15: qty 1

## 2022-05-15 SURGICAL SUPPLY — 19 items
BACTOSHIELD CHG 4% 4OZ (MISCELLANEOUS) ×1
DEVICE MYOSURE LITE (MISCELLANEOUS) ×1 IMPLANT
DRSG TELFA 3X8 NADH (GAUZE/BANDAGES/DRESSINGS) ×2 IMPLANT
GLOVE BIO SURGEON STRL SZ7 (GLOVE) ×2 IMPLANT
GLOVE SURG UNDER LTX SZ7.5 (GLOVE) ×2 IMPLANT
GOWN STRL REUS W/ TWL LRG LVL3 (GOWN DISPOSABLE) ×2 IMPLANT
GOWN STRL REUS W/TWL LRG LVL3 (GOWN DISPOSABLE) ×4
KIT PROCEDURE FLUENT (KITS) ×1 IMPLANT
KIT TURNOVER CYSTO (KITS) ×2 IMPLANT
MANIFOLD NEPTUNE II (INSTRUMENTS) ×2 IMPLANT
PACK DNC HYST (MISCELLANEOUS) ×2 IMPLANT
PAD DRESSING TELFA 3X8 NADH (GAUZE/BANDAGES/DRESSINGS) IMPLANT
PAD OB MATERNITY 4.3X12.25 (PERSONAL CARE ITEMS) ×2 IMPLANT
PAD PREP 24X41 OB/GYN DISP (PERSONAL CARE ITEMS) ×2 IMPLANT
SCRUB CHG 4% DYNA-HEX 4OZ (MISCELLANEOUS) ×1 IMPLANT
SET CYSTO W/LG BORE CLAMP LF (SET/KITS/TRAYS/PACK) IMPLANT
SOL PREP PVP 2OZ (MISCELLANEOUS) ×2
SOLUTION PREP PVP 2OZ (MISCELLANEOUS) ×1 IMPLANT
WATER STERILE IRR 500ML POUR (IV SOLUTION) ×2 IMPLANT

## 2022-05-15 NOTE — Anesthesia Preprocedure Evaluation (Signed)
Anesthesia Evaluation  Patient identified by MRN, date of birth, ID band Patient awake    Reviewed: Allergy & Precautions, H&P , NPO status , Patient's Chart, lab work & pertinent test results, reviewed documented beta blocker date and time   History of Anesthesia Complications (+) PROLONGED EMERGENCE and history of anesthetic complications  Airway Mallampati: I  TM Distance: >3 FB Neck ROM: full    Dental  (+) Dental Advidsory Given, Implants, Caps, Teeth Intact   Pulmonary neg pulmonary ROS, former smoker,    Pulmonary exam normal breath sounds clear to auscultation       Cardiovascular Exercise Tolerance: Good hypertension, (-) angina(-) Past MI and (-) Cardiac Stents Normal cardiovascular exam(-) dysrhythmias (-) Valvular Problems/Murmurs Rhythm:regular Rate:Normal     Neuro/Psych negative neurological ROS  negative psych ROS   GI/Hepatic negative GI ROS, Neg liver ROS,   Endo/Other  negative endocrine ROS  Renal/GU negative Renal ROS  negative genitourinary   Musculoskeletal   Abdominal   Peds  Hematology negative hematology ROS (+)   Anesthesia Other Findings Past Medical History: 2014: Blood in urine 2013: Breast mass No date: BV (bacterial vaginosis) No date: Cancer ([place])     Comment:  Breast Jan 2021 No date: Edema     Comment:  legs No date: Hypertension No date: Thyroid disease   Reproductive/Obstetrics negative OB ROS                             Anesthesia Physical Anesthesia Plan  ASA: 2  Anesthesia Plan: General   Post-op Pain Management:    Induction: Intravenous  PONV Risk Score and Plan: 3 and Ondansetron, Dexamethasone, Midazolam and Treatment may vary due to age or medical condition  Airway Management Planned: Oral ETT  Additional Equipment:   Intra-op Plan:   Post-operative Plan: Extubation in OR  Informed Consent: I have reviewed the patients  History and Physical, chart, labs and discussed the procedure including the risks, benefits and alternatives for the proposed anesthesia with the patient or authorized representative who has indicated his/her understanding and acceptance.     Dental Advisory Given  Plan Discussed with: Anesthesiologist, CRNA and Surgeon  Anesthesia Plan Comments:         Anesthesia Quick Evaluation

## 2022-05-15 NOTE — Transfer of Care (Signed)
Immediate Anesthesia Transfer of Care Note  Patient: [name]  Procedure(s) Performed: DILATATION AND CURETTAGE WITH HYSTEROSCOPY POSSIBLE POLYPECTOMY POSSIBLE [place]  Patient Location: PACU  Anesthesia Type:General  Level of Consciousness: awake, alert  and patient cooperative  Airway & Oxygen Therapy: Patient Spontanous Breathing and Patient connected to nasal cannula oxygen  Post-op Assessment: Report given to RN, Post -op Vital signs reviewed and stable and Patient moving all extremities X 4  Post vital signs: stable  Last Vitals:  Vitals Value Taken Time  BP 103/56 05/15/22 2109  Temp 97.41F   Pulse 82 05/15/22 2112  Resp 14 05/15/22 2112  SpO2 100 % 05/15/22 2112  Vitals shown include unvalidated device data.  Last Pain:  Vitals:   05/15/22 1906  TempSrc:   PainSc: 0-No pain         Complications: No notable events documented.

## 2022-05-15 NOTE — Discharge Instructions (Addendum)
Discharge instructions after a hysteroscopy with dilation and curettage  Signs and Symptoms to Report  Call our office at [phone] if you have any of the following:    Fever over 100.4 degrees or higher  Severe stomach pain not relieved with pain medications  Bright red bleeding that's heavier than a period that does not slow with rest after the first 24 hours  To go the bathroom a lot (frequency), you can't hold your urine (urgency), or it hurts when you empty your bladder (urinate)  Chest pain  Shortness of breath  Pain in the calves of your legs  Severe nausea and vomiting not relieved with anti-nausea medications  Any concerns  What You Can Expect after Surgery  You may see some pink tinged, bloody fluid. This is normal. You may also have cramping for several days.   Activities after Your Discharge Follow these guidelines to help speed your recovery at home:  Don't drive if you are in pain or taking narcotic pain medicine. You may drive when you can safely slam on the brakes, turn the wheel forcefully, and rotate your torso comfortably. This is typically 4-7 days. Practice in a parking lot or side street prior to attempting to drive regularly.   Ask others to help with household chores for 4 weeks.  Don't do strenuous activities, exercises, or sports like vacuuming, tennis, squash, etc. until your doctor says it is safe to do so.  Walk as you feel able. Rest often since it may take a week or two for your energy level to return to normal.   You may climb stairs  Avoid constipation:   -Eat fruits, vegetables, and whole grains. Eat small meals as your appetite will take time to return to normal.   -Drink 6 to 8 glasses of water each day unless your doctor has told you to limit your fluids.   -Use a laxative or stool softener as needed if constipation becomes a problem. You may take Miralax, metamucil, Citrucil, Colace, Senekot, FiberCon, etc. If this does not relieve the  constipation, try two tablespoons of Milk Of Magnesia every 8 hours until your bowels move.   You may shower.   Do not get in a hot tub, swimming pool, etc. until your doctor agrees.  Do not douche, use tampons, or have sex until your doctor says it is okay, usually about 2 weeks.  Take your pain medicine when you need it. The medicine may not work as well if the pain is bad.  Take the medicines you were taking before surgery. Other medications you might need are pain medications (ibuprofen), medications for constipation (Colace) and nausea medications (Zofran). I'm also sending in an antibiotic, because your cervix is open to the blood out but I worry that bacteria could ascend as well.       AMBULATORY SURGERY  DISCHARGE INSTRUCTIONS   The drugs that you were given will stay in your system until tomorrow so for the next 24 hours you should not: Drive an automobile Make any legal decisions Drink any alcoholic beverage  You may resume regular meals tomorrow.  Today it is better to start with liquids and gradually work up to solid foods. You may eat anything you prefer, but it is better to start with liquids, then soup and crackers, and gradually work up to solid foods.  Please notify your doctor immediately if you have any unusual bleeding, trouble breathing, redness and pain at the surgery site, drainage, fever, or  pain not relieved by medication.  Additional Instructions:  Call office on Tuesday 05/17/2022 to schedule a post op appointment  Please contact your physician with any problems or Same Day Surgery at [phone], Monday through Friday 6 am to 4 pm, or [place] at [facility] number at [phone].

## 2022-05-15 NOTE — Anesthesia Postprocedure Evaluation (Signed)
Anesthesia Post Note  Patient: [name]  Procedure(s) Performed: DILATATION AND CURETTAGE WITH HYSTEROSCOPY POSSIBLE POLYPECTOMY POSSIBLE [place]  Patient location during evaluation: PACU Anesthesia Type: General Level of consciousness: awake and alert Pain management: pain level controlled Vital Signs Assessment: post-procedure vital signs reviewed and stable Respiratory status: spontaneous breathing, nonlabored ventilation, respiratory function stable and patient connected to nasal cannula oxygen Cardiovascular status: blood pressure returned to baseline and stable Postop Assessment: no apparent nausea or vomiting Anesthetic complications: no   No notable events documented.   Last Vitals:  Vitals:   05/15/22 2145 05/15/22 2150  BP: 105/62 107/68  Pulse: 64 63  Resp: 12 12  Temp:    SpO2: 100% 100%    Last Pain:  Vitals:   05/15/22 2144  TempSrc:   PainSc: 0-No pain                 Martha Clan

## 2022-05-15 NOTE — ED Triage Notes (Signed)
Patient c/o vaginal bleeding X3 weeks and LLQ pain. In remission for breast cancer (left side). Is currently taking TXA X5 days. Uterine biopsy unremarkable

## 2022-05-15 NOTE — Consult Note (Addendum)
Reason for Consult: Heavy Vaginal Bleeding Referring Physician: Vanessa [place] FNP  NGA RABON is an 51 y.o. female presenting with significant vaginal bleeding x >5 days. She has failed Aygestin and TXA- she is on day 5 of TXA '1300mg'$  TID without resolution. She is orthostatic.  Of note, she has hx of breast cancer on tamoxifen until last month, when she stopped due to the bleeding.  She saw Dr. Amalia Hailey 05/10/22 was given a course of TXA that has not improved bleeding. She states that this had worked for her in the past.  Since starting the TXA the bleeding continues to be heavy. She states that "it is the worst that it has ever been" and that she is changing pads q 30 minutes and is passing very large clots. Larger than size of grape fruit. She c/o of feeling dizzy and fatigued.   Pertinent Gynecological History: Bleeding: abnormal uterine bleeding.    Contraception: none Sexually transmitted diseases: no past history Previous GYN Procedures:  endocervical polypectomy    Last mammogram: normal Date: 04/26/2022 Last pap: normal Date: 04/26/2022 OB History: G3, P2012   Menstrual History:  No LMP recorded (lmp unknown). Patient is postmenopausal.    Past Medical History:  Diagnosis Date   Blood in urine 2014   Breast mass 2013   BV (bacterial vaginosis)    Cancer [facility])    Breast Jan 2021   Edema    legs   Hypertension    Thyroid disease     Past Surgical History:  Procedure Laterality Date   BREAST BIOPSY Right 2013   benign - fatty tissue   BREAST BIOPSY Left 06-12-13   fibroadenoma /core biopsy   BREAST BIOPSY Right 06-17-13   fibrocystic /stereo biopsy   CESAREAN SECTION  2007   DILATION AND CURETTAGE OF UTERUS     FOOT SURGERY Left    01/2017   ROUX-EN-Y PROCEDURE  03/04/2014   Dr. [place]   TUBAL LIGATION      Family History  Problem Relation Age of Onset   Arthritis Mother    Diabetes Father    Hypertension Father    Polycystic ovary syndrome Sister    Hypothyroidism  Sister    Cancer Maternal Grandmother 37       breast   Ovarian cancer Neg Hx    Colon cancer Neg Hx     Social History:  reports that she has quit smoking. She has never used smokeless tobacco. She reports current alcohol use of about 3.0 standard drinks per week. She reports that she does not use drugs.  Allergies:  Allergies  Allergen Reactions   Tape Other (See Comments)    Blistering, ok with paper tape   Avelox [Moxifloxacin Hcl In Nacl] Palpitations   Chlorhexidine Itching    Chg wipes cause severe itching    Medications: I have reviewed the patient's current medications.  Review of Systems  Blood pressure (!) 100/54, pulse 83, temperature 98.7 F (37.1 C), temperature source Oral, resp. rate 18, height '5\' 5"'$  (1.651 m), weight 96.2 kg, SpO2 100 %. Physical Exam  Results for orders placed or performed during the hospital encounter of 05/15/22 (from the past 48 hour(s))  Lipase, blood     Status: None   Collection Time: 05/15/22 12:33 PM  Result Value Ref Range   Lipase 51 11 - 51 U/L    Comment: Performed at [facility], [address] [place], [place] [address]  Comprehensive metabolic panel  Status: Abnormal   Collection Time: 05/15/22 12:33 PM  Result Value Ref Range   Sodium 141 135 - 145 mmol/L   Potassium 3.8 3.5 - 5.1 mmol/L   Chloride 111 98 - 111 mmol/L   CO2 25 22 - 32 mmol/L   Glucose, Bld 81 70 - 99 mg/dL    Comment: Glucose reference range applies only to samples taken after fasting for at least 8 hours.   BUN 18 6 - 20 mg/dL   Creatinine, Ser 0.76 0.44 - 1.00 mg/dL   Calcium 8.4 (L) 8.9 - 10.3 mg/dL   Total Protein 6.3 (L) 6.5 - 8.1 g/dL   Albumin 3.4 (L) 3.5 - 5.0 g/dL   AST 26 15 - 41 U/L   ALT 20 0 - 44 U/L   Alkaline Phosphatase 61 38 - 126 U/L   Total Bilirubin 0.6 0.3 - 1.2 mg/dL   GFR, Estimated >60 >60 mL/min    Comment: (NOTE) Calculated using the CKD-EPI Creatinine Equation (2021)    Anion gap 5 5 - 15    Comment:  Performed at [facility], [address] [place], [place] [address]  CBC     Status: Abnormal   Collection Time: 05/15/22 12:33 PM  Result Value Ref Range   WBC 6.5 4.0 - 10.5 K/uL   RBC 3.30 (L) 3.87 - 5.11 MIL/uL   Hemoglobin 9.4 (L) 12.0 - 15.0 g/dL   HCT 29.9 (L) 36.0 - 46.0 %   MCV 90.6 80.0 - 100.0 fL   MCH 28.5 26.0 - 34.0 pg   MCHC 31.4 30.0 - 36.0 g/dL   RDW 12.5 11.5 - 15.5 %   Platelets 335 150 - 400 K/uL   nRBC 0.0 0.0 - 0.2 %    Comment: Performed at [facility], [address] [place], [place] [address]  Urinalysis, Routine w reflex microscopic     Status: Abnormal   Collection Time: 05/15/22 12:33 PM  Result Value Ref Range   Color, Urine RED (A) YELLOW   APPearance TURBID (A) CLEAR   Specific Gravity, Urine 1.036 (H) 1.005 - 1.030   pH  5.0 - 8.0    TEST NOT REPORTED DUE TO COLOR INTERFERENCE OF URINE PIGMENT   Glucose, UA (A) NEGATIVE mg/dL    TEST NOT REPORTED DUE TO COLOR INTERFERENCE OF URINE PIGMENT   Hgb urine dipstick (A) NEGATIVE    TEST NOT REPORTED DUE TO COLOR INTERFERENCE OF URINE PIGMENT   Bilirubin Urine (A) NEGATIVE    TEST NOT REPORTED DUE TO COLOR INTERFERENCE OF URINE PIGMENT   Ketones, ur (A) NEGATIVE mg/dL    TEST NOT REPORTED DUE TO COLOR INTERFERENCE OF URINE PIGMENT   Protein, ur (A) NEGATIVE mg/dL    TEST NOT REPORTED DUE TO COLOR INTERFERENCE OF URINE PIGMENT   Nitrite (A) NEGATIVE    TEST NOT REPORTED DUE TO COLOR INTERFERENCE OF URINE PIGMENT   Leukocytes,Ua (A) NEGATIVE    TEST NOT REPORTED DUE TO COLOR INTERFERENCE OF URINE PIGMENT   RBC / HPF >50 (H) 0 - 5 RBC/hpf   WBC, UA >50 (H) 0 - 5 WBC/hpf   Bacteria, UA NONE SEEN NONE SEEN   Squamous Epithelial / LPF NONE SEEN 0 - 5    Comment: Performed at [facility], [address], [place], [place] [address]  Type and screen [place]     Status: None   Collection Time: 05/15/22 12:33 PM  Result Value Ref  Range   ABO/RH(D) O  POS    Antibody Screen NEG    Sample Expiration      05/18/2022,2359 Performed at [facility], [place], [place] [address]     US PELVIC COMPLETE WITH TRANSVAGINAL  Result Date: 05/15/2022 CLINICAL DATA:  Vaginal bleeding. EXAM: ULTRASOUND PELVIS TRANSVAGINAL TECHNIQUE: Transvaginal ultrasound examination of the pelvis was performed including evaluation of the uterus, ovaries, adnexal regions, and pelvic cul-de-sac. COMPARISON:  04/06/2017 FINDINGS: Uterus Measurements: 12.7 x 7.4 x 7.0 cm = volume: 345 mL. No fibroids or other mass visualized. Endometrium Thickness: 26 mm.  No focal abnormality. Right ovary Not visualized. Left ovary Not visualized. Other findings:  No abnormal free fluid IMPRESSION: 1. Thickened endometrium measuring 26 mm. If bleeding remains unresponsive to hormonal or medical therapy, focal lesion work-up with sonohysterogram should be considered. Endometrial biopsy should also be considered in pre-menopausal patients at high risk for endometrial carcinoma. (Ref: Radiological Reasoning: Algorithmic Workup of Abnormal Vaginal Bleeding with Endovaginal Sonography and Sonohysterography. AJR 2008; 845:X64-68) 2. Nonvisualization of the ovaries. Electronically Signed   By: Kerby Moors M.D.   On: 05/15/2022 14:45    Assessment/Plan:[place] c/o bleeding for several weeks. Reviewed information with Dr. Leafy Ro. Discussed u/s and lab results with pt.   Management options discussed, including: Expectant, medical and surgical.  Pt has opted for: D&C, hysteroscopy, possible polypectomy or myomectomy.  She ate 4 hours ago, and we will proceed with anesthesia after 8 hrs.  Consents signed today. Risks of surgery were discussed with the patient including but not limited to: bleeding which may require transfusion; infection which may require antibiotics; injury to uterus or surrounding organs; intrauterine scarring which may impair future fertility; need for  additional procedures including laparotomy or laparoscopy; and other postoperative/anesthesia complications. Written informed consent was obtained.   Philip Aspen 05/15/2022

## 2022-05-15 NOTE — [place] Notes (Signed)
Pt sent to OR via hospital bed by OR RN. All belongings sent with patient.   Earleen Reaper, RN

## 2022-05-15 NOTE — ED Provider Notes (Signed)
[facility] Provider Note    Event Date/Time   First MD Initiated Contact with Patient 05/15/22 1242     (approximate)   History   Abdominal Pain and Vaginal Bleeding   HPI  [name] is a 51 y.o. female with history of hypertension, breast cancer, and history as listed in EMR presents to the emergency department for evaluation of heavy vaginal bleeding.  She has a history of intermittent heavy periods for which she has been treated in the past with either progesterone or TXA which has worked well.  About 3 weeks ago she started bleeding and started TXA 5 days ago which has not provided any relief.  She states that she is passing large clots and had to get up multiple times last night due to saturation of hygiene products.  She is working with Dr. Amalia Hailey who performed a uterine biopsy about a month ago which was negative for concern of cancer.  She spoke with his nurse who advised her to continue the TXA and follow-up in office this week.  Patient states that the bleeding and large clots have not slowed down despite following the recommendations and she decided to come to the emergency department.  Past Medical History:  Diagnosis Date   Blood in urine 2014   Breast mass 2013   BV (bacterial vaginosis)    Cancer [facility])    Breast Jan 2021   Edema    legs   Hypertension    Thyroid disease         Physical Exam   Triage Vital Signs: ED Triage Vitals [05/15/22 1229]  Enc Vitals Group     BP 136/89     Pulse Rate (!) 108     Resp 16     Temp 98.7 F (37.1 C)     Temp Source Oral     SpO2 98 %     Weight 212 lb (96.2 kg)     Height '5\' 5"'$  (1.651 m)     Head Circumference      Peak Flow      Pain Score 4     Pain Loc      Pain Edu?      Excl. in [place]?     Most recent vital signs: Vitals:   05/15/22 1300 05/15/22 1330  BP: 115/69 (!) 100/54  Pulse: 87 83  Resp: 18 18  Temp:    SpO2: 100% 100%    General: Awake, no distress.   CV:  Good peripheral perfusion.  Resp:  Normal effort.  Abd:  No distention. No focal tenderness. Other:     ED Results / Procedures / Treatments   Labs (all labs ordered are listed, but only abnormal results are displayed) Labs Reviewed  COMPREHENSIVE METABOLIC PANEL - Abnormal; Notable for the following components:      Result Value   Calcium 8.4 (*)    Total Protein 6.3 (*)    Albumin 3.4 (*)    All other components within normal limits  CBC - Abnormal; Notable for the following components:   RBC 3.30 (*)    Hemoglobin 9.4 (*)    HCT 29.9 (*)    All other components within normal limits  URINALYSIS, ROUTINE W REFLEX MICROSCOPIC - Abnormal; Notable for the following components:   Color, Urine RED (*)    APPearance TURBID (*)    Specific Gravity, Urine 1.036 (*)    Glucose, UA   (*)  Value: TEST NOT REPORTED DUE TO COLOR INTERFERENCE OF URINE PIGMENT   Hgb urine dipstick   (*)    Value: TEST NOT REPORTED DUE TO COLOR INTERFERENCE OF URINE PIGMENT   Bilirubin Urine   (*)    Value: TEST NOT REPORTED DUE TO COLOR INTERFERENCE OF URINE PIGMENT   Ketones, ur   (*)    Value: TEST NOT REPORTED DUE TO COLOR INTERFERENCE OF URINE PIGMENT   Protein, ur   (*)    Value: TEST NOT REPORTED DUE TO COLOR INTERFERENCE OF URINE PIGMENT   Nitrite   (*)    Value: TEST NOT REPORTED DUE TO COLOR INTERFERENCE OF URINE PIGMENT   Leukocytes,Ua   (*)    Value: TEST NOT REPORTED DUE TO COLOR INTERFERENCE OF URINE PIGMENT   RBC / HPF >50 (*)    WBC, UA >50 (*)    All other components within normal limits  LIPASE, BLOOD  TYPE AND SCREEN     EKG  Not indicated.   RADIOLOGY  Pelvic ultrasound does not show any fibroids or other acute abnormalities with the exception of thickened endometrium which is 26 mm.  I have independently reviewed and interpreted imaging as well as reviewed report from radiology.  PROCEDURES:  Critical Care performed: No  Procedures   MEDICATIONS ORDERED  IN ED:  Medications - No data to display   IMPRESSION / MDM / [place] / ED COURSE   I reviewed the triage vital signs and the nursing notes.  Differential diagnosis includes, but is not limited to: Fibroids, malignancy, pregnancy  Results of labs and ultrasound discussed with the patient.  Plan will be to discuss with OB/GYN on-call for further recommendation.  Clinical Course as of 05/15/22 1729  Sun May 15, 2022  1503 Consulted with on-call midwife who will discuss patient with her attending. [CT]  [id number] OB/GYN on-call, Dr. Benjaman Kindler will plan to take the patient to the OR around 8 PM for a D&C.  Patient is aware and agreeable to the plan. [CT]    Clinical Course User Index [CT] Khamia Stambaugh B, FNP     FINAL CLINICAL IMPRESSION(S) / ED DIAGNOSES   Final diagnoses:  Menometrorrhagia     Rx / DC Orders   ED Discharge Orders     None        Note:  This document was prepared using Dragon voice recognition software and may include unintentional dictation errors.   Victorino Dike, FNP 05/15/22 1729    Naaman Plummer, MD 05/16/22 1036

## 2022-05-15 NOTE — Op Note (Addendum)
Operative Report Hysteroscopy with Dilation and Curettage   Indications: Heavy uterine bleeding, postmenopausal, failed TXA and progesterone - hx of tamoxifen, pt of Dr. Amalia Hailey  Pre-operative Diagnosis: Thickened endometrial stripe,   Post-operative Diagnosis: same.  Procedure: 1. Exam under anesthesia 2. Fractional D&C 3. Hysteroscopy 4. Myosure polypectomy  Surgeon: Benjaman Kindler, MD  Assistant(s):  None  Anesthesia: General LMA anesthesia  Anesthesiologist: Martha Clan, MD Anesthesiologist: Martha Clan, MD CRNA: Loree Fee, CRNA  Estimated Blood Loss:   50m         Total IV Fluids: 10040m Urine Output: 5043mTotal Fluid Deficit:  400 mL          Specimens: Endocervical curettings, endometrial curettings, targeted myosure currettings         Complications:  None; patient tolerated the procedure well.         Disposition: PACU - hemodynamically stable.         Condition: stable  Findings: Uterus measuring 9 cm by sound; normal  vagina, perineum. Thick and fluffy tissue lining the endometrium, polypoid tissue throughout. Cervix dilated to >2cm with BRB  Indication for procedure/Consents: 51 27o. [id number] for scheduled surgery for the aforementioned diagnoses.  Risks of surgery were discussed with the patient including but not limited to: bleeding which may require transfusion; infection which may require antibiotics; injury to uterus or surrounding organs; intrauterine scarring which may impair future fertility; need for additional procedures including laparotomy or laparoscopy; and other postoperative/anesthesia complications. Written informed consent was obtained.    Procedure Details:  D&C/ Myosure  The patient was taken to the operating room where anesthesia was administered and was found to be adequate.  After a formal and adequate timeout was performed, she was placed in the dorsal lithotomy position and examined with the above findings.  She was then prepped and draped in the sterile manner.   Her bladder was catheterized for an estimated amount of clear, yellow urine. A weighed speculum was then placed in the patient's vagina and a single tooth tenaculum was applied to the anterior lip of the cervix.  Her cervix was serially dilated to 15 [place] dilators.  An ECC was performed. The hysteroscope was introduced under direct observation  Using lactated ringers as a distention medium to reveal the above findings. The uterine cavity was carefully examined, both ostia were recognized, and diffusely proliferative endometrium with the polypoid tissue above was noted.   This was resected using the Myosure device.  After further careful visualization of the uterine cavity, the hysteroscope was removed under direct visualization.  A sharp curettage was then performed until there was a gritty texture in all four quadrants.  The tenaculum was removed from the anterior lip of the cervix and the vaginal speculum was removed after applying pressure for good hemostasis.   The patient tolerated the procedure well and was taken to the recovery area awake and in stable condition. She received iv acetaminophen and Toradol prior to leaving the OR.  The patient will be discharged to home as per PACU criteria. Routine postoperative instructions given.  She was prescribed Ibuprofen and Colace. I also gave her po antibiotics for home as her cervix is so dilated. She will follow up in the clinic in two weeks for postoperative evaluation.

## 2022-05-15 NOTE — Anesthesia Procedure Notes (Signed)
Procedure Name: Intubation Date/Time: 05/15/2022 8:24 PM Performed by: Loree Fee, CRNA Pre-anesthesia Checklist: Patient identified, Patient being monitored, Timeout performed, Emergency Drugs available and Suction available Patient Re-evaluated:Patient Re-evaluated prior to induction Oxygen Delivery Method: Circle system utilized Preoxygenation: Pre-oxygenation with 100% oxygen Induction Type: IV induction Ventilation: Mask ventilation without difficulty Laryngoscope Size: Mac and 3 Grade View: Grade I Tube type: Oral Tube size: 7.0 mm Number of attempts: 1 Airway Equipment and Method: Stylet Placement Confirmation: ETT inserted through vocal cords under direct vision, positive ETCO2 and breath sounds checked- equal and bilateral Secured at: 21 cm Tube secured with: Tape Dental Injury: Teeth and Oropharynx as per pre-operative assessment

## 2022-05-16 ENCOUNTER — Encounter: Payer: Self-pay | Admitting: Obstetrics and Gynecology

## 2022-05-16 LAB — PREGNANCY, URINE: Preg Test, Ur: NEGATIVE

## 2022-05-18 LAB — SURGICAL PATHOLOGY

## 2022-05-24 ENCOUNTER — Other Ambulatory Visit: Payer: BC Managed Care – PPO

## 2022-05-24 ENCOUNTER — Other Ambulatory Visit: Payer: Self-pay | Admitting: Family

## 2022-05-24 DIAGNOSIS — I10 Essential (primary) hypertension: Secondary | ICD-10-CM

## 2022-05-26 ENCOUNTER — Ambulatory Visit (INDEPENDENT_AMBULATORY_CARE_PROVIDER_SITE_OTHER): Payer: BC Managed Care – PPO | Admitting: Nurse Practitioner

## 2022-05-26 ENCOUNTER — Encounter (INDEPENDENT_AMBULATORY_CARE_PROVIDER_SITE_OTHER): Payer: Self-pay | Admitting: Nurse Practitioner

## 2022-05-26 VITALS — BP 107/67 | HR 91 | Resp 16 | Wt 208.0 lb

## 2022-05-26 DIAGNOSIS — I10 Essential (primary) hypertension: Secondary | ICD-10-CM | POA: Diagnosis not present

## 2022-05-26 DIAGNOSIS — I83819 Varicose veins of unspecified lower extremities with pain: Secondary | ICD-10-CM | POA: Diagnosis not present

## 2022-06-06 ENCOUNTER — Telehealth: Payer: Self-pay

## 2022-06-06 ENCOUNTER — Telehealth: Payer: Self-pay | Admitting: Gastroenterology

## 2022-06-06 NOTE — Telephone Encounter (Signed)
Patient called to cancel her procedure and will callback in July to reschedule called endo and canceled

## 2022-06-06 NOTE — Telephone Encounter (Signed)
Pt cancelled procedure for [id number] because of conflict with another procedure she will call back in July to reschedule colonoscopy

## 2022-06-07 DIAGNOSIS — Z09 Encounter for follow-up examination after completed treatment for conditions other than malignant neoplasm: Secondary | ICD-10-CM | POA: Diagnosis not present

## 2022-06-07 DIAGNOSIS — N950 Postmenopausal bleeding: Secondary | ICD-10-CM | POA: Diagnosis not present

## 2022-06-07 DIAGNOSIS — Z9889 Other specified postprocedural states: Secondary | ICD-10-CM | POA: Diagnosis not present

## 2022-06-07 DIAGNOSIS — D62 Acute posthemorrhagic anemia: Secondary | ICD-10-CM | POA: Diagnosis not present

## 2022-06-11 ENCOUNTER — Encounter (INDEPENDENT_AMBULATORY_CARE_PROVIDER_SITE_OTHER): Payer: Self-pay | Admitting: Nurse Practitioner

## 2022-06-14 ENCOUNTER — Other Ambulatory Visit: Payer: Self-pay | Admitting: Obstetrics and Gynecology

## 2022-06-16 ENCOUNTER — Ambulatory Visit: Admit: 2022-06-16 | Payer: BC Managed Care – PPO | Admitting: Gastroenterology

## 2022-06-16 SURGERY — COLONOSCOPY WITH PROPOFOL
Anesthesia: General

## 2022-07-13 DIAGNOSIS — Z48815 Encounter for surgical aftercare following surgery on the digestive system: Secondary | ICD-10-CM | POA: Diagnosis not present

## 2022-07-13 DIAGNOSIS — Z713 Dietary counseling and surveillance: Secondary | ICD-10-CM | POA: Diagnosis not present

## 2022-07-13 DIAGNOSIS — D509 Iron deficiency anemia, unspecified: Secondary | ICD-10-CM | POA: Diagnosis not present

## 2022-07-13 DIAGNOSIS — Z9884 Bariatric surgery status: Secondary | ICD-10-CM | POA: Diagnosis not present

## 2022-08-03 DIAGNOSIS — Z713 Dietary counseling and surveillance: Secondary | ICD-10-CM | POA: Diagnosis not present

## 2022-08-03 DIAGNOSIS — Z9884 Bariatric surgery status: Secondary | ICD-10-CM | POA: Diagnosis not present

## 2022-08-03 DIAGNOSIS — E669 Obesity, unspecified: Secondary | ICD-10-CM | POA: Diagnosis not present

## 2022-08-04 ENCOUNTER — Ambulatory Visit (INDEPENDENT_AMBULATORY_CARE_PROVIDER_SITE_OTHER): Payer: BC Managed Care – PPO | Admitting: Vascular Surgery

## 2022-08-04 DIAGNOSIS — K912 Postsurgical malabsorption, not elsewhere classified: Secondary | ICD-10-CM | POA: Diagnosis not present

## 2022-08-04 DIAGNOSIS — Z79899 Other long term (current) drug therapy: Secondary | ICD-10-CM | POA: Diagnosis not present

## 2022-08-04 DIAGNOSIS — Z9884 Bariatric surgery status: Secondary | ICD-10-CM | POA: Diagnosis not present

## 2022-08-25 DIAGNOSIS — Z01818 Encounter for other preprocedural examination: Secondary | ICD-10-CM | POA: Diagnosis not present

## 2022-08-25 DIAGNOSIS — K9089 Other intestinal malabsorption: Secondary | ICD-10-CM | POA: Diagnosis not present

## 2022-08-25 DIAGNOSIS — D500 Iron deficiency anemia secondary to blood loss (chronic): Secondary | ICD-10-CM | POA: Diagnosis not present

## 2022-08-25 DIAGNOSIS — N950 Postmenopausal bleeding: Secondary | ICD-10-CM | POA: Diagnosis not present

## 2022-08-25 DIAGNOSIS — E611 Iron deficiency: Secondary | ICD-10-CM | POA: Insufficient documentation

## 2022-08-25 DIAGNOSIS — E538 Deficiency of other specified B group vitamins: Secondary | ICD-10-CM | POA: Diagnosis not present

## 2022-08-25 DIAGNOSIS — D62 Acute posthemorrhagic anemia: Secondary | ICD-10-CM | POA: Diagnosis not present

## 2022-08-28 NOTE — [place] Notes (Signed)
   Indication:  Patient presents with symptomatic varicose veins of the right lower extremity.  Procedure:  Sclerotherapy using SDS mixed with 1% Lidocaine was performed on the right lower extremity.  Compression wraps were placed.  The patient tolerated the procedure well.  Plan:  Follow up as needed.

## 2022-08-29 ENCOUNTER — Encounter (INDEPENDENT_AMBULATORY_CARE_PROVIDER_SITE_OTHER): Payer: Self-pay | Admitting: Vascular Surgery

## 2022-08-29 ENCOUNTER — Ambulatory Visit (INDEPENDENT_AMBULATORY_CARE_PROVIDER_SITE_OTHER): Payer: BC Managed Care – PPO | Admitting: Vascular Surgery

## 2022-08-29 VITALS — BP 110/70 | HR 74 | Resp 17 | Ht 65.0 in | Wt 212.6 lb

## 2022-08-29 DIAGNOSIS — I83819 Varicose veins of unspecified lower extremities with pain: Secondary | ICD-10-CM | POA: Diagnosis not present

## 2022-08-29 DIAGNOSIS — I83811 Varicose veins of right lower extremities with pain: Secondary | ICD-10-CM

## 2022-08-30 ENCOUNTER — Other Ambulatory Visit: Payer: Self-pay | Admitting: Obstetrics and Gynecology

## 2022-08-30 DIAGNOSIS — N939 Abnormal uterine and vaginal bleeding, unspecified: Secondary | ICD-10-CM

## 2022-08-30 DIAGNOSIS — D500 Iron deficiency anemia secondary to blood loss (chronic): Secondary | ICD-10-CM

## 2022-08-30 DIAGNOSIS — Z170 Estrogen receptor positive status [ER+]: Secondary | ICD-10-CM | POA: Diagnosis not present

## 2022-08-30 DIAGNOSIS — Z9010 Acquired absence of unspecified breast and nipple: Secondary | ICD-10-CM | POA: Diagnosis not present

## 2022-08-30 DIAGNOSIS — C50912 Malignant neoplasm of unspecified site of left female breast: Secondary | ICD-10-CM | POA: Diagnosis not present

## 2022-08-30 DIAGNOSIS — Z6835 Body mass index (BMI) 35.0-35.9, adult: Secondary | ICD-10-CM | POA: Diagnosis not present

## 2022-08-30 MED ORDER — SODIUM CHLORIDE 0.9 % IV SOLN: 300.0000 mg | INTRAVENOUS | 0 refills | Status: AC

## 2022-08-30 NOTE — Hospital Course (Signed)
Iv iron prior to surgery

## 2022-08-30 NOTE — [place] Notes (Signed)
Hgb 10.6  prior to high risk surgery

## 2022-08-30 NOTE — [place] Notes (Signed)
IV iron ordered for weekly

## 2022-08-30 NOTE — [place] Notes (Signed)
Iv iron

## 2022-08-31 ENCOUNTER — Ambulatory Visit
Admission: RE | Admit: 2022-08-31 | Discharge: 2022-08-31 | Disposition: A | Discharge status: Home / Self Care | Payer: BC Managed Care – PPO | Source: Ambulatory Visit | Attending: Obstetrics and Gynecology | Admitting: Obstetrics and Gynecology

## 2022-08-31 DIAGNOSIS — D500 Iron deficiency anemia secondary to blood loss (chronic): Secondary | ICD-10-CM | POA: Diagnosis not present

## 2022-08-31 MED ORDER — SODIUM CHLORIDE 0.9 % IV SOLN
300.0000 mg | INTRAVENOUS | Status: DC
  Administered 2022-08-31: 300 mg via INTRAVENOUS
  Filled 2022-08-31: qty 300

## 2022-09-02 ENCOUNTER — Encounter
Admission: RE | Admit: 2022-09-02 | Discharge: 2022-09-02 | Disposition: A | Discharge status: Home / Self Care | Payer: BC Managed Care – PPO | Source: Ambulatory Visit | Attending: Obstetrics and Gynecology | Admitting: Obstetrics and Gynecology

## 2022-09-02 ENCOUNTER — Other Ambulatory Visit: Payer: Self-pay

## 2022-09-02 ENCOUNTER — Ambulatory Visit
Admission: RE | Admit: 2022-09-02 | Discharge: 2022-09-02 | Disposition: A | Discharge status: Home / Self Care | Payer: BC Managed Care – PPO | Source: Ambulatory Visit | Attending: Obstetrics and Gynecology | Admitting: Obstetrics and Gynecology

## 2022-09-02 DIAGNOSIS — N939 Abnormal uterine and vaginal bleeding, unspecified: Secondary | ICD-10-CM | POA: Diagnosis not present

## 2022-09-02 DIAGNOSIS — Z01818 Encounter for other preprocedural examination: Secondary | ICD-10-CM | POA: Diagnosis not present

## 2022-09-02 DIAGNOSIS — I10 Essential (primary) hypertension: Secondary | ICD-10-CM | POA: Insufficient documentation

## 2022-09-02 DIAGNOSIS — Z01812 Encounter for preprocedural laboratory examination: Secondary | ICD-10-CM

## 2022-09-02 HISTORY — DX: Pneumonia, unspecified organism: J18.9

## 2022-09-02 HISTORY — DX: Anemia, unspecified: D64.9

## 2022-09-02 HISTORY — DX: Sleep apnea, unspecified: G47.30

## 2022-09-02 HISTORY — DX: Dyspnea, unspecified: R06.00

## 2022-09-02 MED ORDER — SODIUM CHLORIDE FLUSH 0.9 % IV SOLN
INTRAVENOUS | Status: AC
  Filled 2022-09-02: qty 10

## 2022-09-02 MED ORDER — SODIUM CHLORIDE 0.9 % IV SOLN
400.0000 mg | INTRAVENOUS | Status: DC
  Administered 2022-09-02: 400 mg via INTRAVENOUS
  Filled 2022-09-02: qty 20

## 2022-09-02 NOTE — Patient Instructions (Addendum)
Your procedure is scheduled on: 09/08/22 - Thursday Report to the Registration Desk on the 1st floor of the [facility]. To find out your arrival time, please call [phone] between 1PM - 3PM on: 09/07/22 - Wednesday If your arrival time is 6:00 am, do not arrive prior to that time as the [facility] entrance doors do not open until 6:00 am.  REMEMBER: Instructions that are not followed completely may result in serious medical risk, up to and including death; or upon the discretion of your surgeon and anesthesiologist your surgery may need to be rescheduled.  Do not eat food after midnight the night before surgery.  No gum chewing, lozengers or hard candies.  You may however, drink CLEAR liquids up to 2 hours before you are scheduled to arrive for your surgery. Do not drink anything within 2 hours of your scheduled arrival time.  Clear liquids include: - water  - apple juice without pulp - gatorade (not RED colors) - black coffee or tea (Do NOT add milk or creamers to the coffee or tea) Do NOT drink anything that is not on this list.  TAKE THESE MEDICATIONS THE MORNING OF SURGERY WITH A SIP OF WATER: NONE  Hold Semaglutide-Weight Management (WEGOVY) & days prior to your procedure.  One week prior to surgery: Stop Anti-inflammatories (NSAIDS) such as Advil, Aleve, Ibuprofen, Motrin, Naproxen, Naprosyn and Aspirin based products such as Excedrin, Goodys Powder, BC Powder.  Stop ANY OVER THE COUNTER supplements until after surgery.  You may however, continue to take Tylenol if needed for pain up until the day of surgery.  No Alcohol for 24 hours before or after surgery.  No Smoking including e-cigarettes for 24 hours prior to surgery.  No chewable tobacco products for at least 6 hours prior to surgery.  No nicotine patches on the day of surgery.  Do not use any "recreational" drugs for at least a week prior to your surgery.  Please be advised that the combination of cocaine  and anesthesia may have negative outcomes, up to and including death. If you test positive for cocaine, your surgery will be cancelled.  On the morning of surgery brush your teeth with toothpaste and water, you may rinse your mouth with mouthwash if you wish. Do not swallow any toothpaste or mouthwash.  Do not wear jewelry, make-up, hairpins, clips or nail polish.  Do not wear lotions, powders, or perfumes.   Do not shave body from the neck down 48 hours prior to surgery just in case you cut yourself which could leave a site for infection.  Also, freshly shaved skin may become irritated if using the CHG soap.  Contact lenses, hearing aids and dentures may not be worn into surgery.  Do not bring valuables to the hospital. [facility] is not responsible for any missing/lost belongings or valuables.   Notify your doctor if there is any change in your medical condition (cold, fever, infection).  Wear comfortable clothing (specific to your surgery type) to the hospital.  After surgery, you can help prevent lung complications by doing breathing exercises.  Take deep breaths and cough every 1-2 hours. Your doctor may order a device called an Incentive Spirometer to help you take deep breaths. When coughing or sneezing, hold a pillow firmly against your incision with both hands. This is called "splinting." Doing this helps protect your incision. It also decreases belly discomfort.  If you are being admitted to the hospital overnight, leave your suitcase in the car.  After surgery it may be brought to your room.  If you are being discharged the day of surgery, you will not be allowed to drive home. You will need a responsible adult (18 years or older) to drive you home and stay with you that night.   If you are taking public transportation, you will need to have a responsible adult (18 years or older) with you. Please confirm with your physician that it is acceptable to use public  transportation.   Please call the [facility] at [phone] if you have any questions about these instructions.  Surgery Visitation Policy:  Patients undergoing a surgery or procedure may have two family members or support persons with them as long as the person is not COVID-19 positive or experiencing its symptoms.   Inpatient Visitation:    Visiting hours are 7 a.m. to 8 p.m. Up to four visitors are allowed at one time in a patient room, including children. The visitors may rotate out with other people during the day. One designated support person (adult) may remain overnight.

## 2022-09-04 ENCOUNTER — Encounter (INDEPENDENT_AMBULATORY_CARE_PROVIDER_SITE_OTHER): Payer: Self-pay | Admitting: Vascular Surgery

## 2022-09-05 ENCOUNTER — Ambulatory Visit
Admission: RE | Admit: 2022-09-05 | Discharge: 2022-09-05 | Disposition: A | Discharge status: Home / Self Care | Payer: BC Managed Care – PPO | Source: Ambulatory Visit | Attending: Obstetrics and Gynecology | Admitting: Obstetrics and Gynecology

## 2022-09-05 ENCOUNTER — Encounter: Payer: Self-pay | Admitting: Urgent Care

## 2022-09-05 ENCOUNTER — Encounter
Admission: RE | Admit: 2022-09-05 | Discharge: 2022-09-05 | Disposition: A | Discharge status: Home / Self Care | Payer: BC Managed Care – PPO | Source: Ambulatory Visit | Attending: Obstetrics and Gynecology | Admitting: Obstetrics and Gynecology

## 2022-09-05 DIAGNOSIS — Z01812 Encounter for preprocedural laboratory examination: Secondary | ICD-10-CM | POA: Insufficient documentation

## 2022-09-05 DIAGNOSIS — I10 Essential (primary) hypertension: Secondary | ICD-10-CM | POA: Diagnosis not present

## 2022-09-05 DIAGNOSIS — Z01818 Encounter for other preprocedural examination: Secondary | ICD-10-CM | POA: Diagnosis not present

## 2022-09-05 DIAGNOSIS — N939 Abnormal uterine and vaginal bleeding, unspecified: Secondary | ICD-10-CM

## 2022-09-05 LAB — CBC
HCT: 32.7 % — ABNORMAL LOW (ref 36.0–46.0)
Hemoglobin: 10 g/dL — ABNORMAL LOW (ref 12.0–15.0)
MCH: 24.6 pg — ABNORMAL LOW (ref 26.0–34.0)
MCHC: 30.6 g/dL (ref 30.0–36.0)
MCV: 80.5 fL (ref 80.0–100.0)
Platelets: 264 10*3/uL (ref 150–400)
RBC: 4.06 MIL/uL (ref 3.87–5.11)
RDW: 20.1 % — ABNORMAL HIGH (ref 11.5–15.5)
WBC: 7 10*3/uL (ref 4.0–10.5)
nRBC: 0 % (ref 0.0–0.2)

## 2022-09-05 LAB — TYPE AND SCREEN
ABO/RH(D): O POS
Antibody Screen: NEGATIVE

## 2022-09-05 MED ORDER — SODIUM CHLORIDE 0.9 % IV SOLN
400.0000 mg | Freq: Once | INTRAVENOUS | Status: AC
  Administered 2022-09-05: 400 mg via INTRAVENOUS
  Filled 2022-09-05: qty 20

## 2022-09-05 MED ORDER — SODIUM CHLORIDE FLUSH 0.9 % IV SOLN
INTRAVENOUS | Status: AC
  Filled 2022-09-05: qty 10

## 2022-09-06 NOTE — H&P (Signed)
[name] is a 51 y.o. female presenting with Pre Op Consulting (Sign consents)   History of Present Illness: Patient returns today for preop exam for hysterectomy to manage her AUB, menorrhagia failed medical management. She underwent a D&C in 04/2022, pathology consistent with endometrial polyps. We discussed her options to manage her AUB and she has requested hysterectomy. Returns today for preop.    D&C hysteroscopy with myosure polypectomy on 05/15/22. Indications for surgery: Heavy uterine bleeding, postmenopausal, failed TXA and progesterone -    Left ER+ breast cancer, current tamoxifen use with lumpectomy 2021.   Has recently seen hematology for microcytic anemia, has iv iron dextran planned. "The patient was advised of the risks of iron dextran including risk of anaphylactic reaction in 1-10,000 people, as well as more common side effects including myalgias, arthralgias, and GI symptoms. Ms. Dini verbalized understanding these side effects and has verbally consented to treatment with parenteral iron dextran X1000 mg IV ". This is planned for 09/08/22   Does have a hx of gastric bypass. C/S x1.   Operative report and pathology were reviewed. Operative findings:  Uterus measuring 9 cm by sound; normal vagina, perineum. Thick and fluffy tissue lining the endometrium, polypoid tissue throughout. Cervix dilated to >2cm with BRB   Pathology:  A.  ENDOMETRIAL CURETTINGS:  - BREAKDOWN/MENSTRUAL ENDOMETRIUM.  - NEGATIVE FOR ATYPIA/EIN AND MALIGNANCY.   B.  ENDOCERVICAL CURETTINGS:  - ACUTELY INFLAMED ENDOCERVICAL GLANDULAR MUCOSA.  - BREAKDOWN/MENSTRUAL ENDOMETRIUM AND BLOOD.  - NEGATIVE FOR ATYPIA/EIN AND MALIGNANCY.   C.  ENDOMETRIAL POLYP; POLYPECTOMY:  - FRAGMENTS OF ENDOMETRIAL POLYP IN A BACKGROUND OF BREAKDOWN/MENSTRUAL  ENDOMETRIUM.  - NEGATIVE FOR ATYPIA/EIN AND MALIGNANCY  Pap 03/2021 neg/neg   TVUS 04/2022  Uterus: 12.7 x 7.4 x 7.0 cm = volume: 345 mL. No fibroids  or other mass visualized.  Endometrium: 26 mm.  No focal abnormality.  Right ovary. Not visualized.  Left ovary. Not visualized.  Other findings: No abnormal free fluid    Past Medical History:  has a past medical history of Anemia (April 2023), Bariatric surgery status, Robot assisted RYGB (03/04/14) (09/22/2014), Bilateral lower extremity edema, Dietary counseling and surveillance (09/22/2014), Edema, Female stress incontinence (06/27/2013), History of cancer (12/2019), Hypertension (05/09/2012), Morbid obesity (CMS-HCC) (09/22/2014), Morbid obesity with BMI of 45.0-49.9, adult (CMS-HCC), Sleep apnea, Sore throat, unspecified, and Thyroid goiter.  Past Surgical History:  has a past surgical history that includes Cesarean section; Tubal ligation; Tonsillectomy; Dilation and curettage, diagnostic / therapeutic; Breast surgery (2014); esophagogastrodoudenoscopy w/biopsy (02/06/2014); egd (N/A, 03/04/2014); Laparoscopic gastric bypass (03/04/2014); Bariatric Surgery (2015); pr cesarean delivery only (2007); Dilation and curettage of uterus; Hysteroscopy; myosure polypectomy; and Breast biopsy (2021). Family History: family history includes Breast cancer in her maternal grandmother; Diabetes type II in her father; Gout in her father; High blood pressure (Hypertension) in her father; No Known Problems in her brother and sister; Osteoarthritis in her mother; Rheum arthritis in her mother. Social History:  reports that she quit smoking about 16 years ago. Her smoking use included cigarettes. She has a 15.00 pack-year smoking history. She has never used smokeless tobacco. She reports current alcohol use of about 2.0 standard drinks per week. She reports that she does not use drugs. OB/GYN History:  OB History       Gravida 3   Para 2   Term 2   Preterm     AB 1   Living 2       SAB  IAB     Ectopic     Molar     Multiple     Live Births 1        Allergies: is  allergic to avelox [moxifloxacin], adhesive, and chlorhexidine. Medications: Current Outpatient Medications:    calcium citrate-vitamin D3 (CITRACAL+D) 315 mg- 250 unit tablet, Take 2 tablets by mouth once daily, Disp: , Rfl:    cyanocobalamin, vitamin B-12, 2,500 mcg Subl, Place 1 tablet under the tongue twice a week., Disp: , Rfl:    ferrous sulfate 325 (65 FE) MG tablet, Take 325 mg by mouth daily with breakfast, Disp: , Rfl:    hydroCHLOROthiazide (HYDRODIURIL) 25 MG tablet, Take 25 mg by mouth once daily, Disp: , Rfl:    multivitamin tablet, Take 2 tablets by mouth once daily., Disp: , Rfl:    semaglutide (WEGOVY) 2.4 mg/0.75 mL pen injector, Inject 0.75 mLs (2.4 mg total) subcutaneously once a week, Disp: 3 mL, Rfl: 5   valACYclovir (VALTREX) 500 MG tablet, , Disp: , Rfl:    ergocalciferol, vitamin D2, 1,250 mcg (50,000 unit) capsule, Take 1 capsule (50,000 Units total) by mouth once a week Take 1 capsule once a week until complete for 60 days (Patient not taking: Reported on 06/07/2022), Disp: 8 capsule, Rfl: 0   LIDOCAINE 2 % solution, , Disp: , Rfl:    norethindrone (AYGESTIN) 5 mg tablet, Take by mouth (Patient not taking: Reported on 08/25/2022), Disp: , Rfl:    ondansetron (ZOFRAN) 4 MG tablet, , Disp: , Rfl:    oxyCODONE (ROXICODONE) 5 MG immediate release tablet, , Disp: , Rfl:    pen needle, diabetic (BD ULTRA-FINE NANO PEN NEEDLE) 32 gauge x 5/32" Ndle, Use 1 each once daily (Patient not taking: Reported on 08/25/2022), Disp: 90 each, Rfl: 1   tamoxifen (NOLVADEX) 20 MG tablet, , Disp: , Rfl:    tranexamic acid (LYSTEDA) 650 mg tablet, continuous as needed. (Patient not taking: Reported on 06/07/2022), Disp: , Rfl:    VENTOLIN HFA 90 mcg/actuation inhaler, , Disp: , Rfl:    Review of Systems: No SOB, no palpitations or chest pain, no new lower extremity edema, no nausea or vomiting or bowel or bladder complaints. See HPI for gyn specific ROS.    Exam:   BP 105/62   Pulse 76   Ht  165.1 cm ('5\' 5"'$ )   Wt 95.6 kg (210 lb 12.8 oz)   LMP 07/08/2018 (Exact Date)   BMI 35.08 kg/m    General: Patient is well-groomed, well-nourished, appears stated age in no acute distress   HEENT: head is atraumatic and normocephalic, trachea is midline, neck is supple with no palpable nodules   CV: Regular rhythm and normal heart rate, no murmur   Pulm: Clear to auscultation throughout lung fields with no wheezing, crackles, or rhonchi. No increased work of breathing   Abdomen: soft , no mass, non-tender, no rebound tenderness, no hepatomegaly   Pelvic: deferred    Impression:   The primary encounter diagnosis was Preop examination. Diagnoses of Post-menopausal bleeding and Anemia associated with acute blood loss were also pertinent to this visit.   Plan:   1. Preop: TLH, BSO -Patient returns for a preoperative discussion regarding her plans to proceed with surgical treatment of her PMB, AUB by total laparoscopic hysterectomy with bilateral salpingectomy and oophorectomy procedure.  We may perform a cystoscopy to evaluate the urinary tract after the procedure, if surgically indicated for uro tract integrity.    Because of  her microcytic anemia (hx of gastric bypass) I recommend iv iron infusions prior to surgery. She has an appoinment with [facility] 2 days prior to hyste. I will try to get her in sooner with [place] for weekly.   Because of her hx of gastric bypass, will avoid LUQ ports.   The patient and I discussed the technical aspects of the procedure including the potential for risks and complications.  These include but are not limited to the risk of infection requiring post-operative antibiotics or further procedures.  We talked about the risk of injury to adjacent organs including bladder, bowel, ureter, blood vessels or nerves.  We talked about the need to convert to an open incision.  We talked about the possible need for blood transfusion.  We talked about postop complications  such as thromboembolic or cardiopulmonary complications.  All of her questions were answered.  Her preoperative exam was completed and the appropriate consents were signed. She is scheduled to undergo this procedure in the near future.   Specific Peri-operative Considerations:  - Consent: obtained today - Health Maintenance:  Planning iron infusions with her hematologist at [facility]. Will try to schedule at [facility]: CBC, CMP preoperatively - Studies: EKG, CXR preoperatively - Bowel Preparation: None required - Abx:  Ancef 2 g - VTE ppx: SCDs perioperatively     Diagnoses and all orders for this visit:   Preop examination   Post-menopausal bleeding   Anemia associated with acute blood loss     Return for Postop check.

## 2022-09-07 ENCOUNTER — Ambulatory Visit
Admission: RE | Admit: 2022-09-07 | Discharge: 2022-09-07 | Disposition: A | Discharge status: Home / Self Care | Payer: BC Managed Care – PPO | Source: Ambulatory Visit | Attending: Obstetrics and Gynecology | Admitting: Obstetrics and Gynecology

## 2022-09-07 DIAGNOSIS — N950 Postmenopausal bleeding: Secondary | ICD-10-CM | POA: Diagnosis not present

## 2022-09-07 DIAGNOSIS — Z6834 Body mass index (BMI) 34.0-34.9, adult: Secondary | ICD-10-CM | POA: Diagnosis not present

## 2022-09-07 DIAGNOSIS — Z9884 Bariatric surgery status: Secondary | ICD-10-CM | POA: Diagnosis not present

## 2022-09-07 DIAGNOSIS — I10 Essential (primary) hypertension: Secondary | ICD-10-CM | POA: Diagnosis not present

## 2022-09-07 DIAGNOSIS — D250 Submucous leiomyoma of uterus: Secondary | ICD-10-CM | POA: Diagnosis not present

## 2022-09-07 DIAGNOSIS — D509 Iron deficiency anemia, unspecified: Secondary | ICD-10-CM | POA: Diagnosis not present

## 2022-09-07 DIAGNOSIS — Z853 Personal history of malignant neoplasm of breast: Secondary | ICD-10-CM | POA: Diagnosis not present

## 2022-09-07 DIAGNOSIS — N8003 Adenomyosis of the uterus: Secondary | ICD-10-CM | POA: Diagnosis not present

## 2022-09-07 DIAGNOSIS — N938 Other specified abnormal uterine and vaginal bleeding: Secondary | ICD-10-CM | POA: Diagnosis not present

## 2022-09-07 DIAGNOSIS — G4730 Sleep apnea, unspecified: Secondary | ICD-10-CM | POA: Diagnosis not present

## 2022-09-07 MED ORDER — SODIUM CHLORIDE 0.9 % IV SOLN
400.0000 mg | Freq: Once | INTRAVENOUS | Status: AC
  Administered 2022-09-07: 400 mg via INTRAVENOUS
  Filled 2022-09-07: qty 20

## 2022-09-08 ENCOUNTER — Ambulatory Visit: Payer: BC Managed Care – PPO | Admitting: Certified Registered"

## 2022-09-08 ENCOUNTER — Encounter
Admission: RE | Disposition: A | Discharge status: Home / Self Care | Payer: Self-pay | Source: Home / Self Care | Attending: Obstetrics and Gynecology

## 2022-09-08 ENCOUNTER — Other Ambulatory Visit: Payer: Self-pay

## 2022-09-08 ENCOUNTER — Ambulatory Visit
Admission: RE | Admit: 2022-09-08 | Discharge: 2022-09-08 | Disposition: A | Discharge status: Home / Self Care | Payer: BC Managed Care – PPO | Attending: Obstetrics and Gynecology | Admitting: Obstetrics and Gynecology

## 2022-09-08 ENCOUNTER — Encounter: Payer: Self-pay | Admitting: Obstetrics and Gynecology

## 2022-09-08 DIAGNOSIS — Z9884 Bariatric surgery status: Secondary | ICD-10-CM | POA: Insufficient documentation

## 2022-09-08 DIAGNOSIS — G4730 Sleep apnea, unspecified: Secondary | ICD-10-CM | POA: Diagnosis not present

## 2022-09-08 DIAGNOSIS — Z853 Personal history of malignant neoplasm of breast: Secondary | ICD-10-CM | POA: Insufficient documentation

## 2022-09-08 DIAGNOSIS — N939 Abnormal uterine and vaginal bleeding, unspecified: Secondary | ICD-10-CM

## 2022-09-08 DIAGNOSIS — N8003 Adenomyosis of the uterus: Secondary | ICD-10-CM | POA: Diagnosis not present

## 2022-09-08 DIAGNOSIS — N938 Other specified abnormal uterine and vaginal bleeding: Secondary | ICD-10-CM | POA: Insufficient documentation

## 2022-09-08 DIAGNOSIS — D250 Submucous leiomyoma of uterus: Secondary | ICD-10-CM | POA: Diagnosis not present

## 2022-09-08 DIAGNOSIS — Z6834 Body mass index (BMI) 34.0-34.9, adult: Secondary | ICD-10-CM | POA: Diagnosis not present

## 2022-09-08 DIAGNOSIS — N950 Postmenopausal bleeding: Secondary | ICD-10-CM | POA: Insufficient documentation

## 2022-09-08 DIAGNOSIS — I10 Essential (primary) hypertension: Secondary | ICD-10-CM | POA: Diagnosis not present

## 2022-09-08 DIAGNOSIS — D509 Iron deficiency anemia, unspecified: Secondary | ICD-10-CM | POA: Insufficient documentation

## 2022-09-08 HISTORY — PX: LAPAROSCOPIC BILATERAL SALPINGO OOPHERECTOMY: SHX5890

## 2022-09-08 HISTORY — PX: LAPAROSCOPIC HYSTERECTOMY: SHX1926

## 2022-09-08 LAB — POCT PREGNANCY, URINE: Preg Test, Ur: NEGATIVE

## 2022-09-08 SURGERY — HYSTERECTOMY, TOTAL, LAPAROSCOPIC
Anesthesia: General

## 2022-09-08 MED ORDER — OXYCODONE HCL 5 MG/5ML PO SOLN: 5.0000 mg | Freq: Once | ORAL | Status: AC | PRN

## 2022-09-08 MED ORDER — LACTATED RINGERS IV SOLN: INTRAVENOUS | Status: DC | PRN

## 2022-09-08 MED ORDER — CEFAZOLIN SODIUM-DEXTROSE 2-4 GM/100ML-% IV SOLN
INTRAVENOUS | Status: AC
  Filled 2022-09-08: qty 100

## 2022-09-08 MED ORDER — DOCUSATE SODIUM 100 MG PO CAPS: 100.0000 mg | ORAL_CAPSULE | Freq: Two times a day (BID) | ORAL | 0 refills | Status: DC

## 2022-09-08 MED ORDER — PHENYLEPHRINE 80 MCG/ML (10ML) SYRINGE FOR IV PUSH (FOR BLOOD PRESSURE SUPPORT)
PREFILLED_SYRINGE | INTRAVENOUS | Status: DC | PRN
  Administered 2022-09-08 (×3): 160 ug via INTRAVENOUS

## 2022-09-08 MED ORDER — FENTANYL CITRATE (PF) 100 MCG/2ML IJ SOLN
INTRAMUSCULAR | Status: DC | PRN
  Administered 2022-09-08: 100 ug via INTRAVENOUS

## 2022-09-08 MED ORDER — LACTATED RINGERS IV SOLN: INTRAVENOUS | Status: DC

## 2022-09-08 MED ORDER — SUGAMMADEX SODIUM 500 MG/5ML IV SOLN
INTRAVENOUS | Status: DC | PRN
  Administered 2022-09-08: 300 mg via INTRAVENOUS

## 2022-09-08 MED ORDER — BUPIVACAINE HCL (PF) 0.5 % IJ SOLN
INTRAMUSCULAR | Status: AC
  Filled 2022-09-08: qty 30

## 2022-09-08 MED ORDER — GABAPENTIN 300 MG PO CAPS
300.0000 mg | ORAL_CAPSULE | ORAL | Status: AC
  Administered 2022-09-08: 300 mg via ORAL

## 2022-09-08 MED ORDER — ONDANSETRON 4 MG PO TBDP: 4.0000 mg | ORAL_TABLET | Freq: Three times a day (TID) | ORAL | 0 refills | Status: DC | PRN

## 2022-09-08 MED ORDER — KETOROLAC TROMETHAMINE 30 MG/ML IJ SOLN
INTRAMUSCULAR | Status: AC
  Filled 2022-09-08: qty 1

## 2022-09-08 MED ORDER — BUPIVACAINE HCL 0.5 % IJ SOLN
INTRAMUSCULAR | Status: DC | PRN
  Administered 2022-09-08 (×2): 5 mL

## 2022-09-08 MED ORDER — POVIDONE-IODINE 10 % EX SWAB
2.0000 | Freq: Once | CUTANEOUS | Status: AC
  Administered 2022-09-08: 2 via TOPICAL

## 2022-09-08 MED ORDER — FENTANYL CITRATE (PF) 100 MCG/2ML IJ SOLN
INTRAMUSCULAR | Status: AC
  Filled 2022-09-08: qty 2

## 2022-09-08 MED ORDER — ROCURONIUM BROMIDE 100 MG/10ML IV SOLN
INTRAVENOUS | Status: DC | PRN
  Administered 2022-09-08 (×2): 50 mg via INTRAVENOUS

## 2022-09-08 MED ORDER — HYDROMORPHONE HCL 1 MG/ML IJ SOLN
INTRAMUSCULAR | Status: AC
  Administered 2022-09-08: 0.5 mg via INTRAVENOUS
  Filled 2022-09-08: qty 1

## 2022-09-08 MED ORDER — HYDROMORPHONE HCL 1 MG/ML IJ SOLN
0.2500 mg | INTRAMUSCULAR | Status: DC | PRN
  Administered 2022-09-08 (×2): 0.5 mg via INTRAVENOUS

## 2022-09-08 MED ORDER — LIDOCAINE HCL (CARDIAC) PF 100 MG/5ML IV SOSY
PREFILLED_SYRINGE | INTRAVENOUS | Status: DC | PRN
  Administered 2022-09-08: 80 mg via INTRAVENOUS

## 2022-09-08 MED ORDER — OXYCODONE HCL 5 MG PO TABS
ORAL_TABLET | ORAL | Status: AC
  Filled 2022-09-08: qty 1

## 2022-09-08 MED ORDER — CEFAZOLIN SODIUM-DEXTROSE 2-4 GM/100ML-% IV SOLN
2.0000 g | INTRAVENOUS | Status: AC
  Administered 2022-09-08: 2 g via INTRAVENOUS

## 2022-09-08 MED ORDER — FAMOTIDINE 20 MG PO TABS
ORAL_TABLET | ORAL | Status: AC
  Filled 2022-09-08: qty 1

## 2022-09-08 MED ORDER — PHENYLEPHRINE HCL-NACL 20-0.9 MG/250ML-% IV SOLN
INTRAVENOUS | Status: DC | PRN
  Administered 2022-09-08: 50 ug/min via INTRAVENOUS

## 2022-09-08 MED ORDER — ACETAMINOPHEN EXTRA STRENGTH 500 MG PO TABS: 1000.0000 mg | ORAL_TABLET | Freq: Four times a day (QID) | ORAL | 0 refills | Status: AC

## 2022-09-08 MED ORDER — MIDAZOLAM HCL 2 MG/2ML IJ SOLN
INTRAMUSCULAR | Status: AC
  Filled 2022-09-08: qty 2

## 2022-09-08 MED ORDER — ONDANSETRON HCL 4 MG/2ML IJ SOLN
INTRAMUSCULAR | Status: DC | PRN
  Administered 2022-09-08: 4 mg via INTRAVENOUS

## 2022-09-08 MED ORDER — PROPOFOL 10 MG/ML IV BOLUS
INTRAVENOUS | Status: DC | PRN
  Administered 2022-09-08: 150 mg via INTRAVENOUS

## 2022-09-08 MED ORDER — IBUPROFEN 800 MG PO TABS: 800.0000 mg | ORAL_TABLET | Freq: Three times a day (TID) | ORAL | 1 refills | Status: AC

## 2022-09-08 MED ORDER — OXYCODONE HCL 5 MG PO TABS
5.0000 mg | ORAL_TABLET | Freq: Once | ORAL | Status: AC | PRN
  Administered 2022-09-08: 5 mg via ORAL

## 2022-09-08 MED ORDER — DEXAMETHASONE SODIUM PHOSPHATE 10 MG/ML IJ SOLN
INTRAMUSCULAR | Status: DC | PRN
  Administered 2022-09-08: 10 mg via INTRAVENOUS

## 2022-09-08 MED ORDER — ACETAMINOPHEN 500 MG PO TABS
1000.0000 mg | ORAL_TABLET | ORAL | Status: AC
  Administered 2022-09-08: 1000 mg via ORAL

## 2022-09-08 MED ORDER — PROPOFOL 1000 MG/100ML IV EMUL
INTRAVENOUS | Status: AC
  Filled 2022-09-08: qty 100

## 2022-09-08 MED ORDER — DEXMEDETOMIDINE HCL IN NACL 200 MCG/50ML IV SOLN
INTRAVENOUS | Status: DC | PRN
  Administered 2022-09-08: 8 ug via INTRAVENOUS

## 2022-09-08 MED ORDER — GABAPENTIN 300 MG PO CAPS
ORAL_CAPSULE | ORAL | Status: AC
  Filled 2022-09-08: qty 1

## 2022-09-08 MED ORDER — GABAPENTIN 800 MG PO TABS: 800.0000 mg | ORAL_TABLET | Freq: Every day | ORAL | 0 refills | Status: DC

## 2022-09-08 MED ORDER — ACETAMINOPHEN 500 MG PO TABS
ORAL_TABLET | ORAL | Status: AC
  Filled 2022-09-08: qty 2

## 2022-09-08 MED ORDER — MIDAZOLAM HCL 2 MG/2ML IJ SOLN
INTRAMUSCULAR | Status: DC | PRN
  Administered 2022-09-08: 2 mg via INTRAVENOUS

## 2022-09-08 MED ORDER — OXYCODONE HCL 5 MG PO TABS: 5.0000 mg | ORAL_TABLET | ORAL | 0 refills | Status: DC | PRN

## 2022-09-08 MED ORDER — KETOROLAC TROMETHAMINE 30 MG/ML IJ SOLN
30.0000 mg | Freq: Once | INTRAMUSCULAR | Status: AC
  Administered 2022-09-08: 30 mg via INTRAVENOUS

## 2022-09-08 MED ORDER — FAMOTIDINE 20 MG PO TABS
20.0000 mg | ORAL_TABLET | Freq: Once | ORAL | Status: AC
  Administered 2022-09-08: 20 mg via ORAL

## 2022-09-08 SURGICAL SUPPLY — 58 items
ADH SKN CLS APL DERMABOND .7 (GAUZE/BANDAGES/DRESSINGS) ×2
APL PRP STRL LF DISP 70% ISPRP (MISCELLANEOUS) ×2
BAG DRN RND TRDRP ANRFLXCHMBR (UROLOGICAL SUPPLIES) ×2
BAG URINE DRAIN 2000ML AR STRL (UROLOGICAL SUPPLIES) ×4 IMPLANT
BLADE SURG SZ11 CARB STEEL (BLADE) ×2 IMPLANT
CATH FOLEY 2WAY  5CC 16FR (CATHETERS) ×2
CATH FOLEY 2WAY 5CC 16FR (CATHETERS) ×2
CATH URTH 16FR FL 2W BLN LF (CATHETERS) ×2 IMPLANT
CHLORAPREP W/TINT 26 (MISCELLANEOUS) ×2 IMPLANT
COUNTER NEEDLE 20/40 LG (NEEDLE) ×2 IMPLANT
DERMABOND ADVANCED .7 DNX12 (GAUZE/BANDAGES/DRESSINGS) ×2 IMPLANT
DEVICE SUTURE ENDOST 10MM (ENDOMECHANICALS) IMPLANT
DRAPE STERI POUCH LG 24X46 STR (DRAPES) IMPLANT
GAUZE 4X4 16PLY ~~LOC~~+RFID DBL (SPONGE) ×2 IMPLANT
GLOVE BIO SURGEON STRL SZ7 (GLOVE) ×6 IMPLANT
GLOVE SURG SYN 8.0 (GLOVE) ×2 IMPLANT
GLOVE SURG SYN 8.0 PF PI (GLOVE) ×2 IMPLANT
GLOVE SURG UNDER LTX SZ7.5 (GLOVE) ×4 IMPLANT
GOWN STRL REUS W/ TWL LRG LVL3 (GOWN DISPOSABLE) ×6 IMPLANT
GOWN STRL REUS W/ TWL XL LVL3 (GOWN DISPOSABLE) ×2 IMPLANT
GOWN STRL REUS W/TWL LRG LVL3 (GOWN DISPOSABLE) ×6
GOWN STRL REUS W/TWL XL LVL3 (GOWN DISPOSABLE) ×2
GOWN STRL REUS W/TWL XL LVL4 (GOWN DISPOSABLE) IMPLANT
GRASPER SUT TROCAR 14GX15 (MISCELLANEOUS) ×2 IMPLANT
IRRIGATION STRYKERFLOW (MISCELLANEOUS) IMPLANT
IRRIGATOR STRYKERFLOW (MISCELLANEOUS) ×2
KIT PINK PAD W/HEAD ARE REST (MISCELLANEOUS) ×2
KIT PINK PAD W/HEAD ARM REST (MISCELLANEOUS) ×2 IMPLANT
KIT TURNOVER CYSTO (KITS) ×2 IMPLANT
LABEL OR SOLS (LABEL) ×2 IMPLANT
LIGASURE LAP MARYLAND 5MM 37CM (ELECTROSURGICAL) IMPLANT
MANIFOLD NEPTUNE II (INSTRUMENTS) ×2 IMPLANT
MANIPULATOR VCARE LG CRV RETR (MISCELLANEOUS) IMPLANT
NS IRRIG 500ML POUR BTL (IV SOLUTION) ×2 IMPLANT
OCCLUDER COLPOPNEUMO (BALLOONS) ×2 IMPLANT
PACK GYN LAPAROSCOPIC (MISCELLANEOUS) ×2 IMPLANT
PAD OB MATERNITY 4.3X12.25 (PERSONAL CARE ITEMS) ×2 IMPLANT
PAD PREP 24X41 OB/GYN DISP (PERSONAL CARE ITEMS) ×2 IMPLANT
SCISSORS METZENBAUM CVD 33 (INSTRUMENTS) IMPLANT
SCRUB CHG 4% DYNA-HEX 4OZ (MISCELLANEOUS) ×2 IMPLANT
SET TUBE SMOKE EVAC HIGH FLOW (TUBING) ×2 IMPLANT
SLEEVE Z-THREAD 5X100MM (TROCAR) ×2 IMPLANT
SUT ENDO VLOC 180-0-8IN (SUTURE) IMPLANT
SUT MNCRL 4-0 (SUTURE) ×4
SUT MNCRL 4-0 27XMFL (SUTURE) ×4
SUT MNCRL AB 4-0 PS2 18 (SUTURE) ×2 IMPLANT
SUT VIC AB 0 CT1 36 (SUTURE) ×4 IMPLANT
SUT VIC AB 0 CT2 27 (SUTURE) IMPLANT
SUT VIC AB 2-0 UR6 27 (SUTURE) ×2 IMPLANT
SUTURE MNCRL 4-0 27XMF (SUTURE) ×2 IMPLANT
SYR 10ML LL (SYRINGE) ×2 IMPLANT
SYR 5ML LL (SYRINGE) IMPLANT
SYS BAG RETRIEVAL 10MM (BASKET)
SYSTEM BAG RETRIEVAL 10MM (BASKET) IMPLANT
TROCAR XCEL NON-BLD 5MMX100MML (ENDOMECHANICALS) ×2 IMPLANT
TROCAR Z-THRD FIOS HNDL 11X100 (TROCAR) ×2 IMPLANT
TUBING ART PRESS 48 MALE/FEM (TUBING) IMPLANT
TUBING EVAC SMOKE HEATED PNEUM (TUBING) ×2 IMPLANT

## 2022-09-08 NOTE — Discharge Instructions (Addendum)
Discharge instructions after   total laparoscopic hysterectomy   For the next three days, take ibuprofen and acetaminophen on a schedule, every 8 hours. You can take them together or you can intersperse them, and take one every four hours. I also gave you gabapentin for nighttime, to help you sleep and also to control pain. Take gabapentin medicines at night for at least the next 3 nights. You also have a narcotic, oxycodone, to take as needed if the above medicines don't help.  Postop constipation is a major cause of pain. Stay well hydrated, walk as you tolerate, and take over the counter senna as well as stool softeners if you need them.    Signs and Symptoms to Report Call our office at [phone] if you have any of the following.   Fever over 100.4 degrees or higher  Severe stomach pain not relieved with pain medications  Bright red bleeding that's heavier than a period that does not slow with rest  To go the bathroom a lot (frequency), you can't hold your urine (urgency), or it hurts when you empty your bladder (urinate)  Chest pain  Shortness of breath  Pain in the calves of your legs  Severe nausea and vomiting not relieved with anti-nausea medications  Signs of infection around your wounds, such as redness, hot to touch, swelling, green/yellow drainage (like pus), bad smelling discharge  Any concerns  What You Can Expect after Surgery  You may see some pink tinged, bloody fluid and bruising around the wound. This is normal.  You may notice shoulder and neck pain. This is caused by the gas used during surgery to expand your abdomen so your surgeon could get to the uterus easier.  You may have a sore throat because of the tube in your mouth during general anesthesia. This will go away in 2 to 3 days.  You may have some stomach cramps.  You may notice spotting on your panties.  You may have pain around the incision sites.   Activities after Your Discharge Follow these  guidelines to help speed your recovery at home:  Do the coughing and deep breathing as you did in the hospital for 2 weeks. Use the small blue breathing device, called the incentive spirometer for 2 weeks.  Don't drive if you are in pain or taking narcotic pain medicine. You may drive when you can safely slam on the brakes, turn the wheel forcefully, and rotate your torso comfortably. This is typically 1-2 weeks. Practice in a parking lot or side street prior to attempting to drive regularly.   Ask others to help with household chores for 4 weeks.  Do not lift anything heavier that 10 pounds for 4-6 weeks. This includes pets, children, and groceries.  Don't do strenuous activities, exercises, or sports like vacuuming, tennis, squash, etc. until your doctor says it is safe to do so. ---Maintain pelvic rest for 8 weeks. This means nothing in the vagina or rectum at all (no douching, tampons, intercourse) for 8 weeks.   Walk as you feel able. Rest often since it may take two or three weeks for your energy level to return to normal.   You may climb stairs  Avoid constipation:   -Eat fruits, vegetables, and whole grains. Eat small meals as your appetite will take time to return to normal.   -Drink 6 to 8 glasses of water each day unless your doctor has told you to limit your fluids.   -Use a laxative   or stool softener as needed if constipation becomes a problem. You may take Miralax, metamucil, Citrucil, Colace, Senekot, FiberCon, etc. If this does not relieve the constipation, try two tablespoons of Milk Of Magnesia every 8 hours until your bowels move.   You may shower. Gently wash the wounds with a mild soap and water. Pat dry.  Do not get in a hot tub, swimming pool, etc. for 6 weeks.  Do not use lotions, oils, powders on the wounds.  Do not douche, use tampons, or have sex until your doctor says it is okay.  Take your pain medicine when you need it. The medicine may not work as well if the pain is  bad.  Take the medicines you were taking before surgery. Other medications you will need are pain medications and possibly constipation and nausea medications (Zofran).  AMBULATORY SURGERY  DISCHARGE INSTRUCTIONS   The drugs that you were given will stay in your system until tomorrow so for the next 24 hours you should not:  Drive an automobile Make any legal decisions Drink any alcoholic beverage   You may resume regular meals tomorrow.  Today it is better to start with liquids and gradually work up to solid foods.  You may eat anything you prefer, but it is better to start with liquids, then soup and crackers, and gradually work up to solid foods.   Please notify your doctor immediately if you have any unusual bleeding, trouble breathing, redness and pain at the surgery site, drainage, fever, or pain not relieved by medication.    Additional Instructions:        Please contact your physician with any problems or Same Day Surgery at [phone], Monday through Friday 6 am to 4 pm, or [facility] at [facility] number at [phone].  

## 2022-09-08 NOTE — Transfer of Care (Addendum)
Immediate Anesthesia Transfer of Care Note  Patient: [name]  Procedure(s) Performed: HYSTERECTOMY TOTAL LAPAROSCOPIC LAPAROSCOPIC BILATERAL SALPINGECTOMY (Bilateral)  Patient Location: PACU  Anesthesia Type:General  Level of Consciousness: drowsy  Airway & Oxygen Therapy: Patient Spontanous Breathing and Patient connected to face mask oxygen  Post-op Assessment: Report given to RN and Post -op Vital signs reviewed and stable  Post vital signs: Reviewed and stable  Last Vitals:  Vitals Value Taken Time  BP    Temp    Pulse    Resp    SpO2      Last Pain:  Vitals:   09/08/22 0853  TempSrc: Temporal  PainSc: 1          Complications: No notable events documented.

## 2022-09-08 NOTE — Interval H&P Note (Signed)
History and Physical Interval Note:  09/08/2022 12:22 PM  [name]  has presented today for surgery, with the diagnosis of Abnormal uterine bleeding.  The various methods of treatment have been discussed with the patient and family. After consideration of risks, benefits and other options for treatment, the patient has consented to  Procedure(s): HYSTERECTOMY TOTAL LAPAROSCOPIC (N/A) LAPAROSCOPIC BILATERAL SALPINGECTOMY (Bilateral) with bilateral ovaries as a surgical intervention.  The patient's history has been reviewed, patient examined, no change in status, stable for surgery.  I have reviewed the patient's chart and labs.  Questions were answered to the patient's satisfaction.     Benjaman Kindler

## 2022-09-08 NOTE — Anesthesia Postprocedure Evaluation (Signed)
Anesthesia Post Note  Patient: LOLLIE GUNNER  Procedure(s) Performed: HYSTERECTOMY TOTAL LAPAROSCOPIC LAPAROSCOPIC BILATERAL SALPINGO OOPHORECTOMY (Bilateral)  Patient location during evaluation: PACU Anesthesia Type: General Level of consciousness: awake and alert Pain management: pain level controlled Vital Signs Assessment: post-procedure vital signs reviewed and stable Respiratory status: spontaneous breathing, nonlabored ventilation, respiratory function stable and patient connected to nasal cannula oxygen Cardiovascular status: blood pressure returned to baseline and stable Postop Assessment: no apparent nausea or vomiting Anesthetic complications: no   No notable events documented.   Last Vitals:  Vitals:   09/08/22 1557 09/08/22 1600  BP:  138/76  Pulse: 67 64  Resp: 20 18  Temp:    SpO2: 93% 100%    Last Pain:  Vitals:   09/08/22 1600  TempSrc:   PainSc: 4                  Ilene Qua

## 2022-09-08 NOTE — Op Note (Signed)
[name] PROCEDURE DATE: 09/08/2022  PREOPERATIVE DIAGNOSIS: AUB, hx of breast cancer POSTOPERATIVE DIAGNOSIS: The same PROCEDURE:  HYSTERECTOMY TOTAL LAPAROSCOPIC: [id number] LAPAROSCOPIC BILATERAL SALPINGO OOPHORECTOMY: 35009 (CPT)  SURGEON:  Dr. Benjaman Kindler, MD ASSISTANT: Dr. Laverta Baltimore, MD  Anesthesiologist:  Anesthesiologist: Ilene Qua, MD; Arita Miss, MD CRNA: Beverely Low, CRNA; Biagio Borg, CRNA  INDICATIONS: 51 y.o. [phone]  here for definitive surgical management secondary to the indications listed under preoperative diagnoses; please see preoperative note for further details.  Risks of surgery were discussed with the patient including but not limited to: bleeding which may require transfusion or reoperation; infection which may require antibiotics; injury to bowel, bladder, ureters or other surrounding organs; need for additional procedures; thromboembolic phenomenon, incisional problems and other postoperative/anesthesia complications. Written informed consent was obtained.    FINDINGS:  External genitalia negative for lesions. Vagina with descensus posteriorly. Adnexa negative for masses or nodularity. Cervix without gross lesions. Uterus mobile, anteverted, small.   Intraoperative findings revealed a normal upper abdomen including bowel, diaphragmatic surfaces, stomach, and omentum.   The uterus was small and mobile.  The right and left ovaries appeared normal.  Bilateral tubes appeared normal.  ANESTHESIA:    General INTRAVENOUS FLUIDS:1500  ml ESTIMATED BLOOD LOSS:75 ml URINE OUTPUT: 300 ml   SPECIMENS: Uterus, cervix, bilateral fallopian tubes and bilateral ovaries COMPLICATIONS: None immediate  PROCEDURE IN DETAIL:  The patient received prophalactic intravenous antibiotics and had sequential compression devices applied to her lower extremities while in the preoperative area.  She was then taken to the operating room where general  anesthesia was administered and was found to be adequate.  She was placed in the dorsal lithotomy position, and was prepped and draped in a sterile manner.  A formal time out was performed with all team members present and in agreement.  A large V-care uterine manipulator was placed at this time.  A Foley catheter was inserted into her bladder and attached to constant drainage. Attention was turned to the abdomen where an umbilical incision was made with the scalpel.  The Optiview 5-mm trocar and sleeve were then advanced without difficulty with the laparoscope under direct visualization into the abdomen.  The abdomen was then insufflated with carbon dioxide gas and adequate pneumoperitoneum was obtained.  A survey of the patient's pelvis and abdomen revealed the findings above.  Bilateral lower quadrant ports (5 mm on the right and 10 mm on the left) were then placed under direct visualization.  The pelvis was then carefully examined.  Attention was turned to the IP ligaments. These were fulgurated and ligated, freeing the ovaries from the pelvic sidewall. The broad ligament was transected and the anterior and posterior leaflets of the broad opened. The uterine artery was then skeletonized and a bladder flap was created.  The ureters were noted to be safely away from the area of dissection.  The bladder was then bluntly dissected off the lower uterine segment.    At this point, attention was turned to the uterine vessels, which were clamped and ligated using the Ligasure.  Good hemostasis was noted overall.  The uterosacral and cardinal ligaments were clamped, cut and ligated bilaterally .  Attention was then turned to the cervicovaginal junction, and the bipolar scissors were used to transect the cervix from the surrounding vagina using the ring of the V-care as a guide. This was done circumferentially allowing total hysterectomy.  The uterus was then removed from the vagina and the vaginal cuff incision  was  then closed with running V-loc.  Overall excellent hemostasis was noted.    Attention was returned to the abdomen.The ureters were reexamined bilaterally and were pulsating normally. The abdominal pressure was reduced and hemostasis was confirmed.   The 56m port fascia was closed with a vertical mattress with 0-Vicryl, using the cone closure system. All trocars were removed under direct visualization, and the abdomen was desufflated. All skin incisions were closed with 4-0 Monocryl subcuticular stitches and Dermabond. The patient tolerated the procedures well.  Several figure of eight stiches were placed vaginally to control bleeding at the right lateral angle. Bleeding was noted in this area at the very end of the case, though no bleeding noted inside the abdominal cavity.   All instruments, needles, and sponge counts were correct x 2. The patient was taken to the recovery room awake, extubated and in stable condition.

## 2022-09-08 NOTE — Anesthesia Procedure Notes (Signed)
Procedure Name: Intubation Date/Time: 09/08/2022 12:35 PM  Performed by: Beverely Low, CRNAPre-anesthesia Checklist: Patient identified, Patient being monitored, Timeout performed, Emergency Drugs available and Suction available Patient Re-evaluated:Patient Re-evaluated prior to induction Oxygen Delivery Method: Circle system utilized Preoxygenation: Pre-oxygenation with 100% oxygen Induction Type: IV induction Ventilation: Mask ventilation without difficulty Laryngoscope Size: 3 and McGraph Grade View: Grade I Tube type: Oral Tube size: 6.5 mm Number of attempts: 1 Airway Equipment and Method: Stylet Placement Confirmation: ETT inserted through vocal cords under direct vision, positive ETCO2 and breath sounds checked- equal and bilateral Secured at: 21 cm Tube secured with: Tape Dental Injury: Teeth and Oropharynx as per pre-operative assessment

## 2022-09-08 NOTE — Anesthesia Preprocedure Evaluation (Signed)
Anesthesia Evaluation  Patient identified by MRN, date of birth, ID band Patient awake    Reviewed: Allergy & Precautions, NPO status , Patient's Chart, lab work & pertinent test results  History of Anesthesia Complications Negative for: history of anesthetic complications  Airway Mallampati: II  TM Distance: >3 FB Neck ROM: Full    Dental no notable dental hx. (+) Teeth Intact   Pulmonary neg pulmonary ROS, neg sleep apnea, neg COPD, Patient abstained from smoking.Not current smoker, former smoker,    Pulmonary exam normal breath sounds clear to auscultation       Cardiovascular Exercise Tolerance: Good METShypertension, (-) CAD and (-) Past MI (-) dysrhythmias  Rhythm:Regular Rate:Normal - Systolic murmurs    Neuro/Psych negative neurological ROS  negative psych ROS   GI/Hepatic neg GERD  ,(+)     (-) substance abuse  ,   Endo/Other  neg diabetesOn wegovy, last taken 9 days ago. Asymptomatic today  Renal/GU negative Renal ROS     Musculoskeletal   Abdominal (+) + obese,   Peds  Hematology  (+) Blood dyscrasia, anemia ,   Anesthesia Other Findings Past Medical History: No date: Anemia 2014: Blood in urine 2013: Breast mass No date: BV (bacterial vaginosis) No date: Cancer [facility])     Comment:  Breast Jan 2021 No date: Dyspnea     Comment:  due to anemia No date: Edema     Comment:  legs No date: Hypertension No date: Pneumonia No date: Sleep apnea No date: Thyroid disease  Reproductive/Obstetrics                            Anesthesia Physical Anesthesia Plan  ASA: 2  Anesthesia Plan: General   Post-op Pain Management: Tylenol PO (pre-op)* and Gabapentin PO (pre-op)*   Induction: Intravenous  PONV Risk Score and Plan: 4 or greater and Ondansetron, Dexamethasone and Midazolam  Airway Management Planned: Oral ETT  Additional Equipment: None  Intra-op Plan:    Post-operative Plan: Extubation in OR  Informed Consent: I have reviewed the patients History and Physical, chart, labs and discussed the procedure including the risks, benefits and alternatives for the proposed anesthesia with the patient or authorized representative who has indicated his/her understanding and acceptance.     Dental advisory given  Plan Discussed with: CRNA and Surgeon  Anesthesia Plan Comments: (Discussed risks of anesthesia with patient, including PONV, sore throat, lip/dental/eye damage. Rare risks discussed as well, such as cardiorespiratory and neurological sequelae, and allergic reactions. Discussed the role of CRNA in patient's perioperative care. Patient understands.)        Anesthesia Quick Evaluation

## 2022-09-09 ENCOUNTER — Encounter: Payer: Self-pay | Admitting: Obstetrics and Gynecology

## 2022-09-12 ENCOUNTER — Encounter: Payer: Self-pay | Admitting: Family

## 2022-09-12 LAB — SURGICAL PATHOLOGY

## 2022-09-21 DIAGNOSIS — R300 Dysuria: Secondary | ICD-10-CM | POA: Diagnosis not present

## 2022-10-02 NOTE — Progress Notes (Unsigned)
   Indication:  Patient presents with symptomatic varicose veins of the left lower extremity.  Procedure:  Sclerotherapy using both SDS foam and hypertonic saline mixed with 1% Lidocaine was performed on the left lower extremity.  Compression wraps were placed.  The patient tolerated the procedure well.  Plan:  Follow up as needed.

## 2022-10-03 ENCOUNTER — Ambulatory Visit (INDEPENDENT_AMBULATORY_CARE_PROVIDER_SITE_OTHER): Payer: BC Managed Care – PPO | Admitting: Vascular Surgery

## 2022-10-03 VITALS — BP 103/66 | HR 82 | Resp 16 | Wt 209.0 lb

## 2022-10-03 DIAGNOSIS — I83812 Varicose veins of left lower extremities with pain: Secondary | ICD-10-CM

## 2022-10-03 DIAGNOSIS — I83819 Varicose veins of unspecified lower extremities with pain: Secondary | ICD-10-CM | POA: Diagnosis not present

## 2022-10-05 ENCOUNTER — Encounter (INDEPENDENT_AMBULATORY_CARE_PROVIDER_SITE_OTHER): Payer: Self-pay | Admitting: Vascular Surgery

## 2022-10-07 DIAGNOSIS — D2261 Melanocytic nevi of right upper limb, including shoulder: Secondary | ICD-10-CM | POA: Diagnosis not present

## 2022-10-07 DIAGNOSIS — D2262 Melanocytic nevi of left upper limb, including shoulder: Secondary | ICD-10-CM | POA: Diagnosis not present

## 2022-10-07 DIAGNOSIS — D225 Melanocytic nevi of trunk: Secondary | ICD-10-CM | POA: Diagnosis not present

## 2022-10-07 DIAGNOSIS — D2272 Melanocytic nevi of left lower limb, including hip: Secondary | ICD-10-CM | POA: Diagnosis not present

## 2022-10-17 ENCOUNTER — Encounter (INDEPENDENT_AMBULATORY_CARE_PROVIDER_SITE_OTHER): Payer: Self-pay

## 2022-10-31 DIAGNOSIS — Z713 Dietary counseling and surveillance: Secondary | ICD-10-CM | POA: Diagnosis not present

## 2022-10-31 DIAGNOSIS — Z9884 Bariatric surgery status: Secondary | ICD-10-CM | POA: Diagnosis not present

## 2022-10-31 DIAGNOSIS — E669 Obesity, unspecified: Secondary | ICD-10-CM | POA: Diagnosis not present

## 2022-11-04 ENCOUNTER — Ambulatory Visit (INDEPENDENT_AMBULATORY_CARE_PROVIDER_SITE_OTHER): Payer: BC Managed Care – PPO | Admitting: Family

## 2022-11-04 VITALS — BP 110/78 | HR 99 | Temp 98.1°F | Ht 65.0 in | Wt 211.4 lb

## 2022-11-04 DIAGNOSIS — Z90710 Acquired absence of both cervix and uterus: Secondary | ICD-10-CM

## 2022-11-04 DIAGNOSIS — D509 Iron deficiency anemia, unspecified: Secondary | ICD-10-CM | POA: Diagnosis not present

## 2022-11-04 DIAGNOSIS — Z23 Encounter for immunization: Secondary | ICD-10-CM | POA: Diagnosis not present

## 2022-11-04 DIAGNOSIS — D649 Anemia, unspecified: Secondary | ICD-10-CM | POA: Insufficient documentation

## 2022-11-04 DIAGNOSIS — I10 Essential (primary) hypertension: Secondary | ICD-10-CM | POA: Diagnosis not present

## 2022-11-04 LAB — COMPREHENSIVE METABOLIC PANEL
ALT: 14 U/L (ref 0–35)
AST: 19 U/L (ref 0–37)
Albumin: 4.1 g/dL (ref 3.5–5.2)
Alkaline Phosphatase: 94 U/L (ref 39–117)
BUN: 19 mg/dL (ref 6–23)
CO2: 30 mEq/L (ref 19–32)
Calcium: 9.4 mg/dL (ref 8.4–10.5)
Chloride: 105 mEq/L (ref 96–112)
Creatinine, Ser: 0.68 mg/dL (ref 0.40–1.20)
GFR: 100.75 mL/min (ref >60.00)
Glucose, Bld: 74 mg/dL (ref 70–99)
Potassium: 3.7 mEq/L (ref 3.5–5.1)
Sodium: 141 mEq/L (ref 135–145)
Total Bilirubin: 0.4 mg/dL (ref 0.2–1.2)
Total Protein: 6.7 g/dL (ref 6.0–8.3)

## 2022-11-04 LAB — CBC WITH DIFFERENTIAL/PLATELET
Basophils Absolute: 0.1 10*3/uL (ref 0.0–0.1)
Basophils Relative: 0.8 % (ref 0.0–3.0)
Eosinophils Absolute: 0.2 10*3/uL (ref 0.0–0.7)
Eosinophils Relative: 3.9 % (ref 0.0–5.0)
HCT: 38.4 % (ref 36.0–46.0)
Hemoglobin: 13.1 g/dL (ref 12.0–15.0)
Lymphocytes Relative: 30.9 % (ref 12.0–46.0)
Lymphs Abs: 1.9 10*3/uL (ref 0.7–4.0)
MCHC: 34.1 g/dL (ref 30.0–36.0)
MCV: 85.7 fl (ref 78.0–100.0)
Monocytes Absolute: 0.4 10*3/uL (ref 0.1–1.0)
Monocytes Relative: 5.7 % (ref 3.0–12.0)
Neutro Abs: 3.6 10*3/uL (ref 1.4–7.7)
Neutrophils Relative %: 58.7 % (ref 43.0–77.0)
Platelets: 248 10*3/uL (ref 150.0–400.0)
RBC: 4.49 Mil/uL (ref 3.87–5.11)
RDW: 19.3 % — ABNORMAL HIGH (ref 11.5–15.5)
WBC: 6.2 10*3/uL (ref 4.0–10.5)

## 2022-11-04 LAB — IBC + FERRITIN
Ferritin: 58 ng/mL (ref 10.0–291.0)
Iron: 113 ug/dL (ref 42–145)
Saturation Ratios: 31.2 % (ref 20.0–50.0)
TIBC: 362.6 ug/dL (ref 250.0–450.0)
Transferrin: 259 mg/dL (ref 212.0–360.0)

## 2022-11-04 LAB — B12 AND FOLATE PANEL
Folate: >23.8 ng/mL (ref >5.9)
Vitamin B-12: 325 pg/mL (ref 211–911)

## 2022-11-04 NOTE — Patient Instructions (Signed)
As discussed, I would recommend a colonoscopy and endoscopy.  Please call [facility] if [phone] to schedule.  I have placed referral  Nice to see you!

## 2022-11-04 NOTE — Progress Notes (Signed)
Subjective:    Patient ID: [name], female    DOB: 1971-04-11, 51 y.o.   MRN: [id number]  CC: [name] is a 51 y.o. female who presents today for follow up.   HPI: Follow up anemia, HTN  Feels well today No new complaints   Hypertension-compliant with hydrochlorothiazide 25 mg daily  H/O IDA, gastric bypass, roux en y , Dr Saint Lucia.  No NSAIDs Very little alcohol  09/05/22 Ferritin 5 Hemoglobin 10   She is taking sloe Fe iron one tablet daily.    She has 4 sessions IV iron with [facility] prior to hysterectomy; she had seen Dr Shelda Altes Hematology however IV iron arranged at [facility]   Status post hysterectomy 09/08/2022, Dr. Leafy Ro  DIAGNOSIS:  A. UTERUS, BILATERAL FALLOPIAN TUBES AND OVARIES; HYSTERECTOMY AND  BILATERAL SALPINGO-OOPHORECTOMY:  - BENIGN UTERINE CERVIX.  - BENIGN PROLIFERATIVE ENDOMETRIUM WITH FOCAL STROMAL BREAKDOWN AND  HEMORRHAGE.  - NEGATIVE FOR HYPERPLASIA AND MALIGNANCY.  - SUBMUCOSAL LEIOMYOMA (SMALL), ADENOMYOSIS AND SEROSAL ENDOMETRIOSIS.  - BILATERAL BENIGN OVARIES WITH BENIGN GLANDULAR INCLUSIONS.  - BILATERAL BENIGN FALLOPIAN TUBES.   HISTORY:  Past Medical History:  Diagnosis Date   Anemia    Blood in urine 2014   Breast mass 2013   BV (bacterial vaginosis)    Cancer [facility])    Breast Jan 2021   Dyspnea    due to anemia   Edema    legs   Hypertension    Pneumonia    Sleep apnea    Thyroid disease    Past Surgical History:  Procedure Laterality Date   BREAST BIOPSY Right 2013   benign - fatty tissue   BREAST BIOPSY Left 06/12/2013   fibroadenoma /core biopsy   BREAST BIOPSY Right 06/17/2013   fibrocystic /stereo biopsy   BREAST LUMPECTOMY Left 2021   CESAREAN SECTION  2007   DILATION AND CURETTAGE OF UTERUS     DILATION AND CURETTAGE OF UTERUS N/A 05/15/2022   Procedure: DILATATION AND CURETTAGE WITH HYSTEROSCOPY POSSIBLE POLYPECTOMY POSSIBLE MYOSURE;  Surgeon: Benjaman Kindler, MD;   Location: [facility] ORS;  Service: Gynecology;  Laterality: N/A;   ESOPHAGOGASTRODUODENOSCOPY     FOOT SURGERY Left    01/2017   LAPAROSCOPIC BILATERAL SALPINGO OOPHERECTOMY Bilateral 09/08/2022   Procedure: LAPAROSCOPIC BILATERAL SALPINGO OOPHORECTOMY;  Surgeon: Benjaman Kindler, MD;  Location: [facility] ORS;  Service: Gynecology;  Laterality: Bilateral;   LAPAROSCOPIC HYSTERECTOMY N/A 09/08/2022   No cervix, see pathology notes 09/08/22. Procedure: HYSTERECTOMY TOTAL LAPAROSCOPIC;  Surgeon: Benjaman Kindler, MD;  Location: [facility] ORS;  Service: Gynecology;  Laterality: N/A;   ROUX-EN-Y PROCEDURE  03/04/2014   Dr. Saint Lucia   TONSILLECTOMY     TUBAL LIGATION     WISDOM TOOTH EXTRACTION     Family History  Problem Relation Age of Onset   Arthritis Mother    Diabetes Father    Hypertension Father    Polycystic ovary syndrome Sister    Hypothyroidism Sister    Cancer Maternal Grandmother 57       breast   Ovarian cancer Neg Hx    Colon cancer Neg Hx     Allergies: Tape, Avelox [moxifloxacin hcl in nacl], and Chlorhexidine Current Outpatient Medications on File Prior to Visit  Medication Sig Dispense Refill   acetaminophen (TYLENOL) 500 MG tablet Take 1,000 mg by mouth every 6 (six) hours as needed for mild pain.     docusate sodium (COLACE) 100 MG capsule Take 1 capsule (100 mg  total) by mouth 2 (two) times daily. To keep stools soft 30 capsule 0   Ferrous Sulfate (SLOW FE) 142 (45 Fe) MG TBCR Take 1 tablet by mouth daily.     hydrochlorothiazide (HYDRODIURIL) 25 MG tablet TAKE 1 TABLET BY MOUTH DAILY. 90 tablet 3   Multiple Vitamins-Iron (MULTIVITAMINS WITH IRON) TABS tablet Take 1 tablet by mouth daily.     ondansetron (ZOFRAN-ODT) 4 MG disintegrating tablet Take 1 tablet (4 mg total) by mouth every 8 (eight) hours as needed for nausea or vomiting. 20 tablet 0   Semaglutide-Weight Management (WEGOVY) 2.4 MG/0.75ML SOAJ Inject 2.4 mg into the skin once a week.     valACYclovir (VALTREX) 500 MG  tablet Take 1 tablet (500 mg total) by mouth 2 (two) times daily. (Patient taking differently: Take 500 mg by mouth as needed.) 60 tablet 3   No current facility-administered medications on file prior to visit.    Social History   Tobacco Use   Smoking status: Former   Smokeless tobacco: Never   Tobacco comments:    quit 2006  Vaping Use   Vaping Use: Never used  Substance Use Topics   Alcohol use: Yes    Alcohol/week: 4.0 standard drinks of alcohol    Types: 1 Glasses of wine, 3 Cans of beer per week   Drug use: No    Review of Systems  Constitutional:  Negative for chills and fever.  Respiratory:  Negative for cough.   Cardiovascular:  Negative for chest pain and palpitations.  Gastrointestinal:  Negative for nausea and vomiting.      Objective:    BP 110/78 (BP Location: Left Arm, Patient Position: Sitting, Cuff Size: Normal)   Pulse 99   Temp 98.1 F (36.7 C) (Oral)   Ht '5\' 5"'$  (1.651 m)   Wt 211 lb 6.4 oz (95.9 kg)   LMP  (LMP Unknown) Comment: bleeding since beginning early 2023  SpO2 99%   BMI 35.18 kg/m  BP Readings from Last 3 Encounters:  11/04/22 110/78  10/03/22 103/66  09/08/22 102/61   Wt Readings from Last 3 Encounters:  11/04/22 211 lb 6.4 oz (95.9 kg)  10/03/22 209 lb (94.8 kg)  09/08/22 210 lb (95.3 kg)    Physical Exam Vitals reviewed.  Constitutional:      Appearance: She is well-developed.  Eyes:     Conjunctiva/sclera: Conjunctivae normal.  Cardiovascular:     Rate and Rhythm: Normal rate and regular rhythm.     Pulses: Normal pulses.     Heart sounds: Normal heart sounds.  Pulmonary:     Effort: Pulmonary effort is normal.     Breath sounds: Normal breath sounds. No wheezing, rhonchi or rales.  Skin:    General: Skin is warm and dry.  Neurological:     Mental Status: She is alert.  Psychiatric:        Speech: Speech normal.        Behavior: Behavior normal.        Thought Content: Thought content normal.         Assessment & Plan:   Problem List Items Addressed This Visit       Cardiovascular and Mediastinum   Hypertension    Chronic, stable.  Continue hydrochlorothiazide 25 mg qd        Other   Anemia - Primary    Normocytic anemia.  Suspect worsened with vaginal bleeding.  She is status post hysterectomy, Dr. Leafy Ro.  History of Roux-en-Y.  Ferritin 5 Hemoglobin 10 She is taking Slow Fe 1 tablet daily.  Pending lab evaluation.  I also placed a referral to [facility] for colonoscopy and endoscopy.       Relevant Medications   Ferrous Sulfate (SLOW FE) 142 (45 Fe) MG TBCR   Other Relevant Orders   B12 and Folate Panel   IBC + Ferritin   CBC with Differential/Platelet   Comprehensive metabolic panel   Homocysteine   Celiac Disease Ab Screen w/Rfx   Intrinsic Factor Antibodies   Anti-parietal antibody   Methylmalonic acid, serum   Ambulatory referral to Gastroenterology   History of abdominal hysterectomy     I have discontinued Charmayne Sheer. Urbas's gabapentin and oxyCODONE. I am also having her maintain her multivitamins with iron, valACYclovir, hydrochlorothiazide, acetaminophen, docusate sodium, ondansetron, Slow Fe, and Wegovy.   No orders of the defined types were placed in this encounter.   Return precautions given.   Risks, benefits, and alternatives of the medications and treatment plan prescribed today were discussed, and patient expressed understanding.   Education regarding symptom management and diagnosis given to patient on AVS.  Continue to follow with Burnard Hawthorne, FNP for routine health maintenance.   Ladoris Gene and I agreed with plan.   Mable Paris, FNP

## 2022-11-04 NOTE — Assessment & Plan Note (Signed)
Chronic, stable.  Continue hydrochlorothiazide 25 mg qd

## 2022-11-04 NOTE — Assessment & Plan Note (Addendum)
Normocytic anemia.  Suspect worsened with vaginal bleeding.  She is status post hysterectomy, Dr. Leafy Ro.  History of Roux-en-Y.  Ferritin 5 Hemoglobin 10 She is taking Slow Fe 1 tablet daily.  Pending lab evaluation.  I also placed a referral to [facility] for colonoscopy and endoscopy.

## 2022-11-07 LAB — CELIAC DISEASE AB SCREEN W/RFX
Antigliadin Abs, IgA: 6 units (ref 0–19)
IgA/Immunoglobulin A, Serum: 169 mg/dL (ref 87–352)
Transglutaminase IgA: <2 U/mL (ref 0–3)

## 2022-11-11 LAB — METHYLMALONIC ACID, SERUM: Methylmalonic Acid, Quant: 260 nmol/L (ref 87–318)

## 2022-11-11 LAB — HOMOCYSTEINE: Homocysteine: 11.7 umol/L — ABNORMAL HIGH (ref <10.4)

## 2022-11-11 LAB — INTRINSIC FACTOR ANTIBODIES: Intrinsic Factor: NEGATIVE

## 2022-11-11 LAB — ANTI-PARIETAL ANTIBODY: PARIETAL CELL AB SCREEN: NEGATIVE

## 2022-11-15 ENCOUNTER — Telehealth (INDEPENDENT_AMBULATORY_CARE_PROVIDER_SITE_OTHER): Payer: Self-pay | Admitting: Nurse Practitioner

## 2022-11-15 NOTE — Telephone Encounter (Signed)
LVM for pt TCB and schedule sclero appt   bilateral SALINE sclero. no prior auth req. see FB

## 2022-11-21 ENCOUNTER — Telehealth (INDEPENDENT_AMBULATORY_CARE_PROVIDER_SITE_OTHER): Payer: Self-pay | Admitting: Nurse Practitioner

## 2022-11-21 NOTE — Telephone Encounter (Signed)
LVM for pt TCB and schedule appt  bilateral SALINE sclero. no prior auth req. see FB

## 2022-11-30 ENCOUNTER — Ambulatory Visit
Admission: EM | Admit: 2022-11-30 | Discharge: 2022-11-30 | Disposition: A | Discharge status: Home / Self Care | Payer: BC Managed Care – PPO | Attending: Urgent Care | Admitting: Urgent Care

## 2022-11-30 DIAGNOSIS — J019 Acute sinusitis, unspecified: Secondary | ICD-10-CM | POA: Diagnosis not present

## 2022-11-30 DIAGNOSIS — B9789 Other viral agents as the cause of diseases classified elsewhere: Secondary | ICD-10-CM

## 2022-11-30 DIAGNOSIS — H9202 Otalgia, left ear: Secondary | ICD-10-CM | POA: Diagnosis not present

## 2022-11-30 MED ORDER — PREDNISONE 20 MG PO TABS: ORAL_TABLET | ORAL | 0 refills | Status: AC

## 2022-11-30 NOTE — ED Triage Notes (Signed)
Pt. Presents to UC w/ c/o nasal congestion, a headache, sinus pressure and left ear pin for the past 4 days.

## 2022-11-30 NOTE — ED Provider Notes (Signed)
Roderic Palau    CSN: [id number] Arrival date & time: 11/30/22  1637      History   Chief Complaint Chief Complaint  Patient presents with  . Nasal Congestion  . Headache  . Facial Pain  . Otalgia    HPI [name] is a 51 y.o. female.    Headache Associated symptoms: ear pain   Otalgia Associated symptoms: headaches     Presents to urgent care with complaint of nasal congestion, headache, sinus pressure, left ear pain x 5 days.  Patient states she started to feel body aches and chills on Friday night.  Symptoms worsened from there.  Denies documented fever.  Past Medical History:  Diagnosis Date  . Anemia   . Blood in urine 2014  . Breast mass 2013  . BV (bacterial vaginosis)   . Cancer [facility])    Breast Jan 2021  . Dyspnea    due to anemia  . Edema    legs  . Hypertension   . Pneumonia   . Sleep apnea   . Thyroid disease     Patient Active Problem List   Diagnosis Date Noted  . Anemia 11/04/2022  . Concentration deficit 07/14/2021  . Malignant neoplasm of overlapping sites of left breast in female, estrogen receptor positive ([place]) 02/20/2020  . SOB (shortness of breath) 09/04/2019  . Otitis media 08/30/2019  . Suspected COVID-19 virus infection 08/29/2019  . Leg swelling 09/03/2018  . Acute sinusitis 06/15/2017  . Climacteric 03/30/2017  . History of abdominal hysterectomy 03/30/2017  . Varicose veins with pain 11/17/2015  . Hearing loss 05/25/2015  . Hair loss 11/17/2014  . Status post tubal ligation 09/22/2014  . Routine general medical examination at a health care facility 05/15/2013  . Goiter, nontoxic, multinodular 02/25/2013  . Pes planus 11/20/2012  . Obesity 11/12/2012  . Hypertension 05/09/2012    Past Surgical History:  Procedure Laterality Date  . BREAST BIOPSY Right 2013   benign - fatty tissue  . BREAST BIOPSY Left 06/12/2013   fibroadenoma /core biopsy  . BREAST BIOPSY Right 06/17/2013   fibrocystic /stereo  biopsy  . BREAST LUMPECTOMY Left 2021  . CESAREAN SECTION  2007  . DILATION AND CURETTAGE OF UTERUS    . DILATION AND CURETTAGE OF UTERUS N/A 05/15/2022   Procedure: DILATATION AND CURETTAGE WITH HYSTEROSCOPY POSSIBLE POLYPECTOMY POSSIBLE MYOSURE;  Surgeon: Benjaman Kindler, MD;  Location: [facility] ORS;  Service: Gynecology;  Laterality: N/A;  . ESOPHAGOGASTRODUODENOSCOPY    . FOOT SURGERY Left    01/2017  . LAPAROSCOPIC BILATERAL SALPINGO OOPHERECTOMY Bilateral 09/08/2022   Procedure: LAPAROSCOPIC BILATERAL SALPINGO OOPHORECTOMY;  Surgeon: Benjaman Kindler, MD;  Location: [facility] ORS;  Service: Gynecology;  Laterality: Bilateral;  . LAPAROSCOPIC HYSTERECTOMY N/A 09/08/2022   No cervix, see pathology notes 09/08/22. Procedure: HYSTERECTOMY TOTAL LAPAROSCOPIC;  Surgeon: Benjaman Kindler, MD;  Location: [facility] ORS;  Service: Gynecology;  Laterality: N/A;  . ROUX-EN-Y PROCEDURE  03/04/2014   Dr. [place]  . TONSILLECTOMY    . TUBAL LIGATION    . WISDOM TOOTH EXTRACTION      OB History     Gravida  3   Para  2   Term  2   Preterm      AB  1   Living  2      SAB  1   IAB      Ectopic      Multiple      Live Births  2  Home Medications    Prior to Admission medications   Medication Sig Start Date End Date Taking? Authorizing Provider  acetaminophen (TYLENOL) 500 MG tablet Take 1,000 mg by mouth every 6 (six) hours as needed for mild pain.    [provider]  docusate sodium (COLACE) 100 MG capsule Take 1 capsule (100 mg total) by mouth 2 (two) times daily. To keep stools soft 09/08/22   Benjaman Kindler, MD  Ferrous Sulfate (SLOW FE) 142 (45 Fe) MG TBCR Take 1 tablet by mouth daily.    [provider]  hydrochlorothiazide (HYDRODIURIL) 25 MG tablet TAKE 1 TABLET BY MOUTH DAILY. 05/24/22   Burnard Hawthorne, FNP  Multiple Vitamins-Iron (MULTIVITAMINS WITH IRON) TABS tablet Take 1 tablet by mouth daily.    [provider]  ondansetron  (ZOFRAN-ODT) 4 MG disintegrating tablet Take 1 tablet (4 mg total) by mouth every 8 (eight) hours as needed for nausea or vomiting. 09/08/22   Benjaman Kindler, MD  Semaglutide-Weight Management (WEGOVY) 2.4 MG/0.75ML SOAJ Inject 2.4 mg into the skin once a week.    [provider]  valACYclovir (VALTREX) 500 MG tablet Take 1 tablet (500 mg total) by mouth 2 (two) times daily. Patient taking differently: Take 500 mg by mouth as needed. 05/05/22   Burnard Hawthorne, FNP    Family History Family History  Problem Relation Age of Onset  . Arthritis Mother   . Diabetes Father   . Hypertension Father   . Polycystic ovary syndrome Sister   . Hypothyroidism Sister   . Cancer Maternal Grandmother 92       breast  . Ovarian cancer Neg Hx   . Colon cancer Neg Hx     Social History Social History   Tobacco Use  . Smoking status: Former  . Smokeless tobacco: Never  . Tobacco comments:    quit 2006  Vaping Use  . Vaping Use: Never used  Substance Use Topics  . Alcohol use: Yes    Alcohol/week: 4.0 standard drinks of alcohol    Types: 1 Glasses of wine, 3 Cans of beer per week  . Drug use: No     Allergies   Tape, Avelox [moxifloxacin hcl in nacl], and Chlorhexidine   Review of Systems Review of Systems  HENT:  Positive for ear pain.   Neurological:  Positive for headaches.     Physical Exam Triage Vital Signs ED Triage Vitals [11/30/22 1743]  Enc Vitals Group     BP 119/76     Pulse Rate 93     Resp 17     Temp 98.2 F (36.8 C)     Temp src      SpO2 96 %     Weight      Height      Head Circumference      Peak Flow      Pain Score 0     Pain Loc      Pain Edu?      Excl. in [place]?    No data found.  Updated Vital Signs BP 119/76   Pulse 93   Temp 98.2 F (36.8 C)   Resp 17   LMP  (LMP Unknown) Comment: bleeding since beginning early 2023  SpO2 96%   Visual Acuity Right Eye Distance:   Left Eye Distance:   Bilateral Distance:    Right Eye  Near:   Left Eye Near:    Bilateral Near:     Physical Exam  Vitals reviewed.  Constitutional:      Appearance: She is well-developed.  HENT:     Left Ear: A middle ear effusion is present. Tympanic membrane is not erythematous.     Nose:     Left Sinus: Maxillary sinus tenderness and frontal sinus tenderness present.  Neurological:     Mental Status: She is alert and oriented to person, place, and time.  Psychiatric:        Mood and Affect: Mood normal.        Speech: Speech normal.        Behavior: Behavior normal.     UC Treatments / Results  Labs (all labs ordered are listed, but only abnormal results are displayed) Labs Reviewed - No data to display  EKG   Radiology No results found.  Procedures Procedures (including critical care time)  Medications Ordered in UC Medications - No data to display  Initial Impression / Assessment and Plan / UC Course  I have reviewed the triage vital signs and the nursing notes.  Pertinent labs & imaging results that were available during my care of the patient were reviewed by me and considered in my medical decision making (see chart for details).   Patient is afebrile here without recent antipyretics. Satting well on room air. Overall is well appearing, well hydrated, without respiratory distress.  Left TM is not erythematous.  There is mid ear effusion present.  Maxillary and frontal sinus tenderness on the left side.  Suspect viral sinusitis as the cause of her ear pain.  Deferred respiratory swab given positive result would not change treatment plan.  Final Clinical Impressions(s) / UC Diagnoses   Final diagnoses:  None   Discharge Instructions   None    ED Prescriptions   None    PDMP not reviewed this encounter.   Rose Phi, [place] 11/30/22 1757

## 2022-11-30 NOTE — Discharge Instructions (Addendum)
Follow up here or with your primary care provider if your symptoms are worsening or not improving with treatment.     

## 2022-12-05 ENCOUNTER — Ambulatory Visit: Payer: BC Managed Care – PPO | Admitting: Family

## 2022-12-05 ENCOUNTER — Encounter (INDEPENDENT_AMBULATORY_CARE_PROVIDER_SITE_OTHER): Payer: Self-pay | Admitting: Nurse Practitioner

## 2022-12-05 ENCOUNTER — Ambulatory Visit (INDEPENDENT_AMBULATORY_CARE_PROVIDER_SITE_OTHER): Payer: BC Managed Care – PPO | Admitting: Nurse Practitioner

## 2022-12-05 VITALS — BP 110/72 | HR 74 | Resp 16 | Wt 218.8 lb

## 2022-12-05 DIAGNOSIS — I83813 Varicose veins of bilateral lower extremities with pain: Secondary | ICD-10-CM | POA: Diagnosis not present

## 2022-12-06 ENCOUNTER — Encounter (INDEPENDENT_AMBULATORY_CARE_PROVIDER_SITE_OTHER): Payer: Self-pay | Admitting: Nurse Practitioner

## 2022-12-06 NOTE — Progress Notes (Signed)
Varicose veins of bilateral  lower extremity with inflammation (454.1  I83.10) Current Plans   Indication: Patient presents with symptomatic varicose veins of the bilateral  lower extremity.   Procedure: Sclerotherapy using hypertonic saline mixed with 1% Lidocaine was performed on the bilateral lower extremity. Compression wraps were placed. The patient tolerated the procedure well. 

## 2022-12-08 ENCOUNTER — Ambulatory Visit
Admission: RE | Admit: 2022-12-08 | Discharge: 2022-12-08 | Disposition: A | Discharge status: Home / Self Care | Payer: BC Managed Care – PPO | Source: Ambulatory Visit | Attending: Emergency Medicine | Admitting: Emergency Medicine

## 2022-12-08 VITALS — BP 107/74 | HR 84 | Temp 98.4°F | Resp 18 | Ht 65.0 in | Wt 218.8 lb

## 2022-12-08 DIAGNOSIS — J0100 Acute maxillary sinusitis, unspecified: Secondary | ICD-10-CM

## 2022-12-08 MED ORDER — AMOXICILLIN 875 MG PO TABS: 875.0000 mg | ORAL_TABLET | Freq: Two times a day (BID) | ORAL | 0 refills | Status: AC

## 2022-12-08 NOTE — ED Provider Notes (Signed)
Roderic Palau    CSN: [id number] Arrival date & time: 12/08/22  1701      History   Chief Complaint Chief Complaint  Patient presents with   Headache    Sinus congestion and pressure - Entered by patient    HPI [name] is a 51 y.o. female.  Patient presents with 3-week history of sinus pressure, sinus congestion, sinus pain, sinus headache.  She denies fever, chills, cough, shortness of breath, vomiting, diarrhea, or other symptoms.  Several OTC treatments attempted without relief.  Patient was seen at this urgent care on 11/30/2022; diagnosed with viral sinusitis and otalgia; treated with prednisone.  Medical history includes hypertension, obesity, goiter, breast cancer.  The history is provided by the patient and medical records.    Past Medical History:  Diagnosis Date   Anemia    Blood in urine 2014   Breast mass 2013   BV (bacterial vaginosis)    Cancer [facility])    Breast Jan 2021   Dyspnea    due to anemia   Edema    legs   Hypertension    Pneumonia    Sleep apnea    Thyroid disease     Patient Active Problem List   Diagnosis Date Noted   Anemia 11/04/2022   Concentration deficit 07/14/2021   Malignant neoplasm of overlapping sites of left breast in female, estrogen receptor positive ([place]) 02/20/2020   SOB (shortness of breath) 09/04/2019   Otitis media 08/30/2019   Suspected COVID-19 virus infection 08/29/2019   Leg swelling 09/03/2018   Acute sinusitis 06/15/2017   Climacteric 03/30/2017   History of abdominal hysterectomy 03/30/2017   Varicose veins with pain 11/17/2015   Hearing loss 05/25/2015   Hair loss 11/17/2014   Status post tubal ligation 09/22/2014   Routine general medical examination at a health care facility 05/15/2013   Goiter, nontoxic, multinodular 02/25/2013   Pes planus 11/20/2012   Obesity 11/12/2012   Hypertension 05/09/2012    Past Surgical History:  Procedure Laterality Date   BREAST BIOPSY Right 2013    benign - fatty tissue   BREAST BIOPSY Left 06/12/2013   fibroadenoma /core biopsy   BREAST BIOPSY Right 06/17/2013   fibrocystic /stereo biopsy   BREAST LUMPECTOMY Left 2021   CESAREAN SECTION  2007   DILATION AND CURETTAGE OF UTERUS     DILATION AND CURETTAGE OF UTERUS N/A 05/15/2022   Procedure: DILATATION AND CURETTAGE WITH HYSTEROSCOPY POSSIBLE POLYPECTOMY POSSIBLE MYOSURE;  Surgeon: Benjaman Kindler, MD;  Location: [facility] ORS;  Service: Gynecology;  Laterality: N/A;   ESOPHAGOGASTRODUODENOSCOPY     FOOT SURGERY Left    01/2017   LAPAROSCOPIC BILATERAL SALPINGO OOPHERECTOMY Bilateral 09/08/2022   Procedure: LAPAROSCOPIC BILATERAL SALPINGO OOPHORECTOMY;  Surgeon: Benjaman Kindler, MD;  Location: [facility] ORS;  Service: Gynecology;  Laterality: Bilateral;   LAPAROSCOPIC HYSTERECTOMY N/A 09/08/2022   No cervix, see pathology notes 09/08/22. Procedure: HYSTERECTOMY TOTAL LAPAROSCOPIC;  Surgeon: Benjaman Kindler, MD;  Location: [facility] ORS;  Service: Gynecology;  Laterality: N/A;   ROUX-EN-Y PROCEDURE  03/04/2014   Dr. [place]   TONSILLECTOMY     TUBAL LIGATION     WISDOM TOOTH EXTRACTION      OB History     Gravida  3   Para  2   Term  2   Preterm      AB  1   Living  2      SAB  1   IAB  Ectopic      Multiple      Live Births  2            Home Medications    Prior to Admission medications   Medication Sig Start Date End Date Taking? Authorizing Provider  amoxicillin (AMOXIL) 875 MG tablet Take 1 tablet (875 mg total) by mouth 2 (two) times daily for 10 days. 12/08/22 12/18/22 Yes Sharion Balloon, NP  acetaminophen (TYLENOL) 500 MG tablet Take 1,000 mg by mouth every 6 (six) hours as needed for mild pain.    [provider]  docusate sodium (COLACE) 100 MG capsule Take 1 capsule (100 mg total) by mouth 2 (two) times daily. To keep stools soft 09/08/22   Benjaman Kindler, MD  Ferrous Sulfate (SLOW FE) 142 (45 Fe) MG TBCR Take 1 tablet by mouth daily.     [provider]  hydrochlorothiazide (HYDRODIURIL) 25 MG tablet TAKE 1 TABLET BY MOUTH DAILY. 05/24/22   Burnard Hawthorne, FNP  Multiple Vitamins-Iron (MULTIVITAMINS WITH IRON) TABS tablet Take 1 tablet by mouth daily.    [provider]  ondansetron (ZOFRAN-ODT) 4 MG disintegrating tablet Take 1 tablet (4 mg total) by mouth every 8 (eight) hours as needed for nausea or vomiting. 09/08/22   Benjaman Kindler, MD  Semaglutide-Weight Management (WEGOVY) 2.4 MG/0.75ML SOAJ Inject 2.4 mg into the skin once a week.    [provider]  valACYclovir (VALTREX) 500 MG tablet Take 1 tablet (500 mg total) by mouth 2 (two) times daily. Patient taking differently: Take 500 mg by mouth as needed. 05/05/22   Burnard Hawthorne, FNP    Family History Family History  Problem Relation Age of Onset   Arthritis Mother    Diabetes Father    Hypertension Father    Polycystic ovary syndrome Sister    Hypothyroidism Sister    Cancer Maternal Grandmother 61       breast   Ovarian cancer Neg Hx    Colon cancer Neg Hx     Social History Social History   Tobacco Use   Smoking status: Former   Smokeless tobacco: Never   Tobacco comments:    quit 2006  Vaping Use   Vaping Use: Never used  Substance Use Topics   Alcohol use: Yes    Alcohol/week: 4.0 standard drinks of alcohol    Types: 1 Glasses of wine, 3 Cans of beer per week   Drug use: No     Allergies   Tape, Avelox [moxifloxacin hcl in nacl], and Chlorhexidine   Review of Systems Review of Systems  Constitutional:  Negative for chills and fever.  HENT:  Positive for congestion, postnasal drip, sinus pressure and sinus pain. Negative for ear pain and sore throat.   Respiratory:  Negative for cough and shortness of breath.   Cardiovascular:  Negative for chest pain and palpitations.  Gastrointestinal:  Negative for diarrhea and vomiting.  Skin:  Negative for color change and rash.  All other systems reviewed and are  negative.    Physical Exam Triage Vital Signs ED Triage Vitals [12/08/22 1713]  Enc Vitals Group     BP      Pulse Rate 84     Resp 18     Temp 98.4 F (36.9 C)     Temp src      SpO2 98 %     Weight      Height      Head Circumference  Peak Flow      Pain Score      Pain Loc      Pain Edu?      Excl. in [place]?    No data found.  Updated Vital Signs BP 107/74   Pulse 84   Temp 98.4 F (36.9 C)   Resp 18   Ht '5\' 5"'$  (1.651 m)   Wt 218 lb 12.8 oz (99.2 kg)   LMP  (LMP Unknown) Comment: bleeding since beginning early 2023  SpO2 98%   BMI 36.41 kg/m   Visual Acuity Right Eye Distance:   Left Eye Distance:   Bilateral Distance:    Right Eye Near:   Left Eye Near:    Bilateral Near:     Physical Exam Vitals and nursing note reviewed.  Constitutional:      General: She is not in acute distress.    Appearance: She is well-developed. She is not ill-appearing.  HENT:     Right Ear: Tympanic membrane normal.     Left Ear: Tympanic membrane normal.     Nose: Congestion present.     Mouth/Throat:     Mouth: Mucous membranes are moist.     Pharynx: Oropharynx is clear.  Cardiovascular:     Rate and Rhythm: Normal rate and regular rhythm.     Heart sounds: Normal heart sounds.  Pulmonary:     Effort: Pulmonary effort is normal. No respiratory distress.     Breath sounds: Normal breath sounds.  Musculoskeletal:     Cervical back: Neck supple.  Skin:    General: Skin is warm and dry.  Neurological:     Mental Status: She is alert.  Psychiatric:        Mood and Affect: Mood normal.        Behavior: Behavior normal.      UC Treatments / Results  Labs (all labs ordered are listed, but only abnormal results are displayed) Labs Reviewed - No data to display  EKG   Radiology No results found.  Procedures Procedures (including critical care time)  Medications Ordered in UC Medications - No data to display  Initial Impression / Assessment and  Plan / UC Course  I have reviewed the triage vital signs and the nursing notes.  Pertinent labs & imaging results that were available during my care of the patient were reviewed by me and considered in my medical decision making (see chart for details).    Acute sinusitis.  Patient has been symptomatic for 3 weeks and is not improving.  Treating today with 10-day course of amoxicillin.  Discussed symptomatic treatment including Tylenol or ibuprofen.  Instructed patient to follow up with her PCP if symptoms are not improving.  She agrees to plan of care.   Final Clinical Impressions(s) / UC Diagnoses   Final diagnoses:  Acute non-recurrent maxillary sinusitis     Discharge Instructions      Take the amoxicillin as directed.  Follow up with your primary care provider if your symptoms are not improving.        ED Prescriptions     Medication Sig Dispense Auth. Provider   amoxicillin (AMOXIL) 875 MG tablet Take 1 tablet (875 mg total) by mouth 2 (two) times daily for 10 days. 20 tablet Sharion Balloon, NP      PDMP not reviewed this encounter.   Sharion Balloon, NP 12/08/22 1744

## 2022-12-08 NOTE — ED Triage Notes (Signed)
Patient to Urgent Care with complaints of headache, nasal congestion, and facial pressure x3 weeks. Reports being unable to get any relief from her headache. Denies any known fevers.   Recently prescribed prednisone with no improvement in symptoms.   Has been taking Mucinex/ sudafed/ nasal rinses.

## 2022-12-08 NOTE — Discharge Instructions (Addendum)
Take the amoxicillin as directed.  Follow up with your primary care provider if your symptoms are not improving.   ° ° °

## 2022-12-15 ENCOUNTER — Encounter: Payer: Self-pay | Admitting: Gastroenterology

## 2022-12-15 ENCOUNTER — Other Ambulatory Visit: Payer: Self-pay

## 2022-12-15 ENCOUNTER — Ambulatory Visit (INDEPENDENT_AMBULATORY_CARE_PROVIDER_SITE_OTHER): Payer: BC Managed Care – PPO | Admitting: Gastroenterology

## 2022-12-15 VITALS — BP 106/66 | HR 68 | Temp 97.4°F | Ht 65.0 in | Wt 216.2 lb

## 2022-12-15 DIAGNOSIS — Z1211 Encounter for screening for malignant neoplasm of colon: Secondary | ICD-10-CM

## 2022-12-15 DIAGNOSIS — Z862 Personal history of diseases of the blood and blood-forming organs and certain disorders involving the immune mechanism: Secondary | ICD-10-CM

## 2022-12-15 MED ORDER — CLENPIQ 10-3.5-12 MG-GM -GM/160ML PO SOLN: 320.0000 mL | ORAL | 0 refills | Status: DC

## 2022-12-15 NOTE — Progress Notes (Signed)
Shelby Darby, MD [address]  [place]  [place], [place] [address]  Main: [phone]  Fax: [phone]    Gastroenterology Consultation  Referring Provider:     Burnard Hawthorne, FNP Primary Care Physician:  Burnard Hawthorne, FNP Primary Gastroenterologist:  Dr. Cephas Jimenez Reason for Consultation: History of iron deficiency anemia        HPI:   [name] is a 51 y.o. female referred by Burnard Hawthorne, FNP  for consultation & management of history of iron deficiency anemia.  Patient is found to have normocytic anemia in May 2023, serum ferritin of 6, this was attributed to menorrhagia.  Patient underwent total hysterectomy with bilateral salpingo-oophorectomy on 09/08/2022.  She responded well to parenteral iron therapy.  Since hysterectomy her anemia has resolved.  Most recent serum ferritin levels were 58, hemoglobin was 13.1 in November 2023.  LFTs have been normal.  She had history of Roux-en-Y gastric bypass in 2015.  She denies any GI symptoms.  None  Reports a first cousin with colon cancer at age 13  NSAIDs: None  Antiplts/Anticoagulants/Anti thrombotics: None  GI Procedures:  EGD 02/06/2014 Revealed LA grade B esophagitis  Past Medical History:  Diagnosis Date   Anemia    Blood in urine 2014   Breast mass 2013   BV (bacterial vaginosis)    Cancer ([place])    Breast Jan 2021   Dyspnea    due to anemia   Edema    legs   Hypertension    Pneumonia    Sleep apnea    Thyroid disease     Past Surgical History:  Procedure Laterality Date   BREAST BIOPSY Right 2013   benign - fatty tissue   BREAST BIOPSY Left 06/12/2013   fibroadenoma /core biopsy   BREAST BIOPSY Right 06/17/2013   fibrocystic /stereo biopsy   BREAST LUMPECTOMY Left 2021   CESAREAN SECTION  2007   DILATION AND CURETTAGE OF UTERUS     DILATION AND CURETTAGE OF UTERUS N/A 05/15/2022   Procedure: DILATATION AND CURETTAGE WITH HYSTEROSCOPY POSSIBLE POLYPECTOMY  POSSIBLE MYOSURE;  Surgeon: Benjaman Kindler, MD;  Location: [facility] ORS;  Service: Gynecology;  Laterality: N/A;   ESOPHAGOGASTRODUODENOSCOPY     FOOT SURGERY Left    01/2017   LAPAROSCOPIC BILATERAL SALPINGO OOPHERECTOMY Bilateral 09/08/2022   Procedure: LAPAROSCOPIC BILATERAL SALPINGO OOPHORECTOMY;  Surgeon: Benjaman Kindler, MD;  Location: [facility] ORS;  Service: Gynecology;  Laterality: Bilateral;   LAPAROSCOPIC HYSTERECTOMY N/A 09/08/2022   No cervix, see pathology notes 09/08/22. Procedure: HYSTERECTOMY TOTAL LAPAROSCOPIC;  Surgeon: Benjaman Kindler, MD;  Location: [facility] ORS;  Service: Gynecology;  Laterality: N/A;   ROUX-EN-Y PROCEDURE  03/04/2014   Dr. Saint Lucia   TONSILLECTOMY     TUBAL LIGATION     WISDOM TOOTH EXTRACTION       Current Outpatient Medications:    acetaminophen (TYLENOL) 500 MG tablet, Take 1,000 mg by mouth every 6 (six) hours as needed for mild pain., Disp: , Rfl:    Ferrous Sulfate (SLOW FE) 142 (45 Fe) MG TBCR, Take 1 tablet by mouth daily., Disp: , Rfl:    hydrochlorothiazide (HYDRODIURIL) 25 MG tablet, TAKE 1 TABLET BY MOUTH DAILY., Disp: 90 tablet, Rfl: 3   Multiple Vitamins-Iron (MULTIVITAMINS WITH IRON) TABS tablet, Take 1 tablet by mouth daily., Disp: , Rfl:    Semaglutide-Weight Management (WEGOVY) 2.4 MG/0.75ML SOAJ, Inject 2.4 mg into the skin once a week., Disp: , Rfl:  valACYclovir (VALTREX) 500 MG tablet, Take 1 tablet (500 mg total) by mouth 2 (two) times daily. (Patient taking differently: Take 500 mg by mouth as needed.), Disp: 60 tablet, Rfl: 3   amoxicillin (AMOXIL) 875 MG tablet, Take 1 tablet (875 mg total) by mouth 2 (two) times daily for 10 days. (Patient not taking: Reported on 12/15/2022), Disp: 20 tablet, Rfl: 0   docusate sodium (COLACE) 100 MG capsule, Take 1 capsule (100 mg total) by mouth 2 (two) times daily. To keep stools soft (Patient not taking: Reported on 12/15/2022), Disp: 30 capsule, Rfl: 0   ondansetron (ZOFRAN-ODT) 4 MG disintegrating  tablet, Take 1 tablet (4 mg total) by mouth every 8 (eight) hours as needed for nausea or vomiting. (Patient not taking: Reported on 12/15/2022), Disp: 20 tablet, Rfl: 0   Family History  Problem Relation Age of Onset   Arthritis Mother    Diabetes Father    Hypertension Father    Polycystic ovary syndrome Sister    Hypothyroidism Sister    Cancer Maternal Grandmother 9       breast   Ovarian cancer Neg Hx    Colon cancer Neg Hx      Social History   Tobacco Use   Smoking status: Former   Smokeless tobacco: Never   Tobacco comments:    quit 2006  Vaping Use   Vaping Use: Never used  Substance Use Topics   Alcohol use: Yes    Alcohol/week: 4.0 standard drinks of alcohol    Types: 1 Glasses of wine, 3 Cans of beer per week   Drug use: No    Allergies as of 12/15/2022 - Review Complete 12/15/2022  Allergen Reaction Noted   Tape Other (See Comments) 01/18/2017   Avelox [moxifloxacin hcl in nacl] Palpitations 05/09/2012   Chlorhexidine Itching 01/24/2020    Review of Systems:    All systems reviewed and negative except where noted in HPI.   Physical Exam:  BP 106/66   Pulse 68   Temp (!) 97.4 F (36.3 C) (Oral)   Ht '5\' 5"'$  (1.651 m)   Wt 216 lb 3.2 oz (98.1 kg)   LMP  (LMP Unknown) Comment: bleeding since beginning early 2023  BMI 35.98 kg/m  No LMP recorded (lmp unknown). Patient has had a hysterectomy.  General:   Alert,  Well-developed, well-nourished, pleasant and cooperative in NAD Head:  Normocephalic and atraumatic. Eyes:  Sclera clear, no icterus.   Conjunctiva pink. Ears:  Normal auditory acuity. Nose:  No deformity, discharge, or lesions. Mouth:  No deformity or lesions,oropharynx pink & moist. Neck:  Supple; no masses or thyromegaly. Lungs:  Respirations even and unlabored.  Clear throughout to auscultation.   No wheezes, crackles, or rhonchi. No acute distress. Heart:  Regular rate and rhythm; no murmurs, clicks, rubs, or gallops. Abdomen:  Normal  bowel sounds. Soft, non-tender and non-distended without masses, hepatosplenomegaly or hernias noted.  No guarding or rebound tenderness.   Rectal: Not performed Msk:  Symmetrical without gross deformities. Good, equal movement & strength bilaterally. Pulses:  Normal pulses noted. Extremities:  No clubbing or edema.  No cyanosis. Neurologic:  Alert and oriented x3;  grossly normal neurologically. Skin:  Intact without significant lesions or rashes. No jaundice. Psych:  Alert and cooperative. Normal mood and affect.  Imaging Studies: Reviewed  Assessment and Plan:   [name] is a 52 y.o. pleasant Caucasian female with obesity s/p Roux-en-Y gastric bypass in 2015, history of menorrhagia resulting in severe  iron deficiency anemia, s/p total hysterectomy with bilateral salpingo-oophorectomy in September 2023.  History of left breast cancer in remission.  Iron deficiency anemia has resolved since hysterectomy.  Advised patient to stop oral iron therapy.  Recheck CBC and iron panel in 3 to 6 months  Recommend screening colonoscopy  I have discussed alternative options, risks & benefits,  which include, but are not limited to, bleeding, infection, perforation,respiratory complication & drug reaction.  The patient agrees with this plan & written consent will be obtained.     Follow up based on the colonoscopy results   Shelby Darby, MD

## 2023-01-02 ENCOUNTER — Telehealth (INDEPENDENT_AMBULATORY_CARE_PROVIDER_SITE_OTHER): Payer: Self-pay | Admitting: Nurse Practitioner

## 2023-01-02 NOTE — Telephone Encounter (Signed)
Pt called to check on her appt for tomorrow 1.16.24 for sclerotherapy. She is concerned about the cost since her deductible has started over 1.1.24. Pt is wanting to know if we have a "set price" if we don't run her insurance. I explained that the discounted price that [facility] offers are for uninsured or under insured patients and we can't extend the offer to her as she does have insurance. Pt understood and aksed to cancel appt. I did and advised to call with any issues a=or if she reaches her deductible, to give [place] a call and we can get her back into the schedule. Pt acknowledged. Nothing further is needed at this time.

## 2023-01-03 ENCOUNTER — Ambulatory Visit (INDEPENDENT_AMBULATORY_CARE_PROVIDER_SITE_OTHER): Payer: BC Managed Care – PPO | Admitting: Nurse Practitioner

## 2023-01-16 ENCOUNTER — Telehealth: Payer: Self-pay

## 2023-01-16 MED ORDER — SUTAB 1479-225-188 MG PO TABS: 12.0000 | ORAL_TABLET | Freq: Once | ORAL | 0 refills | Status: AC

## 2023-01-16 NOTE — Telephone Encounter (Signed)
Submitted a PA for Clenpiq through cover my meds because patient states she will cancel if the prep is to expensive because she is not drinking the larger quantity of prep

## 2023-01-16 NOTE — Telephone Encounter (Signed)
Insurance company denied PA for [employer]

## 2023-01-16 NOTE — Telephone Encounter (Signed)
Patient left a voicemail and states she just went to the pharmacy and the prep for her colonoscopy is not covered by her insurance. She would like a different prep called in for her. Patient stated she does not want the gallon jug of medication. Returned patient call and she states that she will not drink the larger quantity prep. Informed patient that it does not look like her insurance will pay for any other prep. She states she will call the pharmacy to see how much the Clenpiq is and she also asked if we could call in the [place] and she will see how much this is also.

## 2023-01-17 NOTE — Telephone Encounter (Signed)
[name] was denied by insurance

## 2023-01-17 NOTE — Telephone Encounter (Signed)
Submitted PA for [employer] through cover my meds

## 2023-01-19 ENCOUNTER — Encounter
Admission: RE | Disposition: A | Discharge status: Home / Self Care | Payer: Self-pay | Source: Home / Self Care | Attending: Gastroenterology

## 2023-01-19 ENCOUNTER — Ambulatory Visit: Payer: BC Managed Care – PPO | Admitting: Anesthesiology

## 2023-01-19 ENCOUNTER — Ambulatory Visit
Admission: RE | Admit: 2023-01-19 | Discharge: 2023-01-19 | Disposition: A | Discharge status: Home / Self Care | Payer: BC Managed Care – PPO | Attending: Gastroenterology | Admitting: Gastroenterology

## 2023-01-19 ENCOUNTER — Encounter: Payer: Self-pay | Admitting: Gastroenterology

## 2023-01-19 DIAGNOSIS — D759 Disease of blood and blood-forming organs, unspecified: Secondary | ICD-10-CM | POA: Insufficient documentation

## 2023-01-19 DIAGNOSIS — D649 Anemia, unspecified: Secondary | ICD-10-CM | POA: Insufficient documentation

## 2023-01-19 DIAGNOSIS — Z87891 Personal history of nicotine dependence: Secondary | ICD-10-CM | POA: Insufficient documentation

## 2023-01-19 DIAGNOSIS — K635 Polyp of colon: Secondary | ICD-10-CM

## 2023-01-19 DIAGNOSIS — Z1211 Encounter for screening for malignant neoplasm of colon: Secondary | ICD-10-CM

## 2023-01-19 DIAGNOSIS — I10 Essential (primary) hypertension: Secondary | ICD-10-CM | POA: Insufficient documentation

## 2023-01-19 DIAGNOSIS — D126 Benign neoplasm of colon, unspecified: Secondary | ICD-10-CM | POA: Diagnosis not present

## 2023-01-19 HISTORY — PX: COLONOSCOPY WITH PROPOFOL: SHX5780

## 2023-01-19 SURGERY — COLONOSCOPY WITH PROPOFOL
Anesthesia: General

## 2023-01-19 MED ORDER — PROPOFOL 500 MG/50ML IV EMUL
INTRAVENOUS | Status: DC | PRN
  Administered 2023-01-19: 150 ug/kg/min via INTRAVENOUS

## 2023-01-19 MED ORDER — PHENYLEPHRINE HCL (PRESSORS) 10 MG/ML IV SOLN
INTRAVENOUS | Status: DC | PRN
  Administered 2023-01-19: 160 ug via INTRAVENOUS

## 2023-01-19 MED ORDER — SODIUM CHLORIDE 0.9 % IV SOLN: INTRAVENOUS | Status: DC

## 2023-01-19 MED ORDER — PROPOFOL 10 MG/ML IV BOLUS
INTRAVENOUS | Status: DC | PRN
  Administered 2023-01-19: 30 mg via INTRAVENOUS
  Administered 2023-01-19: 70 mg via INTRAVENOUS

## 2023-01-19 MED ORDER — LIDOCAINE HCL (CARDIAC) PF 100 MG/5ML IV SOSY
PREFILLED_SYRINGE | INTRAVENOUS | Status: DC | PRN
  Administered 2023-01-19: 50 mg via INTRAVENOUS

## 2023-01-19 NOTE — Transfer of Care (Signed)
Immediate Anesthesia Transfer of Care Note  Patient: [name]  Procedure(s) Performed: COLONOSCOPY WITH PROPOFOL  Patient Location: Endoscopy Unit  Anesthesia Type:General  Level of Consciousness: awake, alert , and oriented  Airway & Oxygen Therapy: Patient Spontanous Breathing  Post-op Assessment: Report given to RN and Post -op Vital signs reviewed and stable  Post vital signs: Reviewed and stable  Last Vitals:  Vitals Value Taken Time  BP 101/45 01/19/23 0852  Temp    Pulse 79 01/19/23 0853  Resp 17 01/19/23 0853  SpO2 100 % 01/19/23 0853  Vitals shown include unvalidated device data.  Last Pain:  Vitals:   01/19/23 0727  TempSrc: Temporal  PainSc: 0-No pain         Complications: No notable events documented.

## 2023-01-19 NOTE — Op Note (Signed)
[facility] Gastroenterology Patient Name: [name] Procedure Date: 01/19/2023 8:16 AM MRN: [id number] Account #: [id number] Date of Birth: 01-Dec-1971 Admit Type: Outpatient Age: 52 Room: [facility] ENDO ROOM 3 Gender: Female Note Status: Finalized Instrument Name: Park Meo [id number] Procedure:             Colonoscopy Indications:           Screening for colorectal malignant neoplasm, This is                         the patient's first colonoscopy Providers:             Lin Landsman MD, MD Referring MD:          Lin Landsman MD, MD (Referring MD), Yvetta Coder.                         Arnett (Referring MD) Medicines:             General Anesthesia Complications:         No immediate complications. Estimated blood loss: None. Procedure:             Pre-Anesthesia Assessment:                        - Prior to the procedure, a History and Physical was                         performed, and patient medications and allergies were                         reviewed. The patient is competent. The risks and                         benefits of the procedure and the sedation options and                         risks were discussed with the patient. All questions                         were answered and informed consent was obtained.                         Patient identification and proposed procedure were                         verified by the physician, the nurse, the                         anesthesiologist, the anesthetist and the technician                         in the pre-procedure area in the procedure room in the                         endoscopy suite. Mental Status Examination: alert and                         oriented. Airway Examination: normal oropharyngeal  airway and neck mobility. Respiratory Examination:                         clear to auscultation. CV Examination: normal.                         Prophylactic Antibiotics:  The patient does not require                         prophylactic antibiotics. Prior Anticoagulants: The                         patient has taken no anticoagulant or antiplatelet                         agents. ASA Grade Assessment: II - A patient with mild                         systemic disease. After reviewing the risks and                         benefits, the patient was deemed in satisfactory                         condition to undergo the procedure. The anesthesia                         plan was to use general anesthesia. Immediately prior                         to administration of medications, the patient was                         re-assessed for adequacy to receive sedatives. The                         heart rate, respiratory rate, oxygen saturations,                         blood pressure, adequacy of pulmonary ventilation, and                         response to care were monitored throughout the                         procedure. The physical status of the patient was                         re-assessed after the procedure.                        After obtaining informed consent, the colonoscope was                         passed under direct vision. Throughout the procedure,                         the patient's blood pressure, pulse, and oxygen  saturations were monitored continuously. The                         Colonoscope was introduced through the anus and                         advanced to the the cecum, identified by appendiceal                         orifice and ileocecal valve. The colonoscopy was                         performed without difficulty. The patient tolerated                         the procedure well. The quality of the bowel                         preparation was evaluated using the BBPS [facility] Bowel                         Preparation Scale) with scores of: Right Colon = 3,                         Transverse Colon = 3  and Left Colon = 3 (entire mucosa                         seen well with no residual staining, small fragments                         of stool or opaque liquid). The total BBPS score                         equals 9. The ileocecal valve, appendiceal orifice,                         and rectum were photographed. Findings:      The perianal and digital rectal examinations were normal. Pertinent       negatives include normal sphincter tone and no palpable rectal lesions.      A 4 mm polyp was found in the sigmoid colon. The polyp was sessile. The       polyp was removed with a cold snare. Resection and retrieval were       complete. Estimated blood loss: none.      The retroflexed view of the distal rectum and anal verge was normal and       showed no anal or rectal abnormalities. Impression:            - One 4 mm polyp in the sigmoid colon, removed with a                         cold snare. Resected and retrieved.                        - The distal rectum and anal verge are normal on  retroflexion view. Recommendation:        - Discharge patient to home (with escort).                        - Resume previous diet today.                        - Continue present medications.                        - Await pathology results.                        - Repeat colonoscopy in 7-10 years for surveillance                         based on pathology results. Procedure Code(s):     --- Professional ---                        [phone], Colonoscopy, flexible; with removal of                         tumor(s), polyp(s), or other lesion(s) by snare                         technique Diagnosis Code(s):     --- Professional ---                        Z12.11, Encounter for screening for malignant neoplasm                         of colon                        D12.5, Benign neoplasm of sigmoid colon CPT copyright 2022 American Medical Association. All rights reserved. The codes  documented in this report are preliminary and upon coder review may  be revised to meet current compliance requirements. Dr. Ulyess Mort Lin Landsman MD, MD 01/19/2023 8:49:22 AM This report has been signed electronically. Number of Addenda: 0 Note Initiated On: 01/19/2023 8:16 AM Scope Withdrawal Time: 0 hours 11 minutes 42 seconds  Total Procedure Duration: 0 hours 18 minutes 23 seconds  Estimated Blood Loss:  Estimated blood loss: none.      [facility]

## 2023-01-19 NOTE — H&P (Signed)
Cephas Darby, MD [address]  [place]  [place], [place] [address]  Main: [phone]  Fax: [phone] Pager: [phone]  Primary Care Physician:  Burnard Hawthorne, FNP Primary Gastroenterologist:  Dr. Cephas Darby  Pre-Procedure History & Physical: HPI:  [name] is a 52 y.o. female is here for an colonoscopy.   Past Medical History:  Diagnosis Date   Anemia    Blood in urine 2014   Breast mass 2013   BV (bacterial vaginosis)    Cancer [facility])    Breast Jan 2021   Dyspnea    due to anemia   Edema    legs   Hypertension    Pneumonia    Sleep apnea    Thyroid disease     Past Surgical History:  Procedure Laterality Date   ABDOMINAL HYSTERECTOMY     BREAST BIOPSY Right 2013   benign - fatty tissue   BREAST BIOPSY Left 06/12/2013   fibroadenoma /core biopsy   BREAST BIOPSY Right 06/17/2013   fibrocystic /stereo biopsy   BREAST LUMPECTOMY Left 2021   CESAREAN SECTION  2007   DILATION AND CURETTAGE OF UTERUS     DILATION AND CURETTAGE OF UTERUS N/A 05/15/2022   Procedure: DILATATION AND CURETTAGE WITH HYSTEROSCOPY POSSIBLE POLYPECTOMY POSSIBLE MYOSURE;  Surgeon: Benjaman Kindler, MD;  Location: [facility] ORS;  Service: Gynecology;  Laterality: N/A;   ESOPHAGOGASTRODUODENOSCOPY     FOOT SURGERY Left    01/2017   LAPAROSCOPIC BILATERAL SALPINGO OOPHERECTOMY Bilateral 09/08/2022   Procedure: LAPAROSCOPIC BILATERAL SALPINGO OOPHORECTOMY;  Surgeon: Benjaman Kindler, MD;  Location: [facility] ORS;  Service: Gynecology;  Laterality: Bilateral;   LAPAROSCOPIC HYSTERECTOMY N/A 09/08/2022   No cervix, see pathology notes 09/08/22. Procedure: HYSTERECTOMY TOTAL LAPAROSCOPIC;  Surgeon: Benjaman Kindler, MD;  Location: [facility] ORS;  Service: Gynecology;  Laterality: N/A;   ROUX-EN-Y PROCEDURE  03/04/2014   Dr. Saint Lucia   TONSILLECTOMY     TUBAL LIGATION     WISDOM TOOTH EXTRACTION      Prior to Admission medications   Medication Sig Start Date End Date Taking?  Authorizing Provider  valACYclovir (VALTREX) 500 MG tablet Take 1 tablet (500 mg total) by mouth 2 (two) times daily. Patient taking differently: Take 500 mg by mouth as needed. 05/05/22  Yes Burnard Hawthorne, FNP  acetaminophen (TYLENOL) 500 MG tablet Take 1,000 mg by mouth every 6 (six) hours as needed for mild pain.    [provider]  docusate sodium (COLACE) 100 MG capsule Take 1 capsule (100 mg total) by mouth 2 (two) times daily. To keep stools soft Patient not taking: Reported on 12/15/2022 09/08/22   Benjaman Kindler, MD  Ferrous Sulfate (SLOW FE) 142 (45 Fe) MG TBCR Take 1 tablet by mouth daily.    [provider]  hydrochlorothiazide (HYDRODIURIL) 25 MG tablet TAKE 1 TABLET BY MOUTH DAILY. 05/24/22   Burnard Hawthorne, FNP  Multiple Vitamins-Iron (MULTIVITAMINS WITH IRON) TABS tablet Take 1 tablet by mouth daily.    [provider]  ondansetron (ZOFRAN-ODT) 4 MG disintegrating tablet Take 1 tablet (4 mg total) by mouth every 8 (eight) hours as needed for nausea or vomiting. Patient not taking: Reported on 12/15/2022 09/08/22   Benjaman Kindler, MD  Semaglutide-Weight Management (WEGOVY) 2.4 MG/0.75ML SOAJ Inject 2.4 mg into the skin once a week.    [provider]  Sod Picosulfate-Mag Ox-Cit Acd (CLENPIQ) 10-3.5-12 MG-GM -GM/160ML SOLN Take 320 mLs by mouth as directed. 12/15/22   Sheana Bir,  Tally Due, MD    Allergies as of 12/16/2022 - Review Complete 12/15/2022  Allergen Reaction Noted   Tape Other (See Comments) 01/18/2017   Avelox [moxifloxacin hcl in nacl] Palpitations 05/09/2012   Chlorhexidine Itching 01/24/2020    Family History  Problem Relation Age of Onset   Arthritis Mother    Diabetes Father    Hypertension Father    Polycystic ovary syndrome Sister    Hypothyroidism Sister    Cancer Maternal Grandmother 93       breast   Ovarian cancer Neg Hx    Colon cancer Neg Hx     Social History   Socioeconomic History   Marital  status: Married    Spouse name: Gwyndolyn Saxon   Number of children: 1   Years of education: Not on file   Highest education level: Not on file  Occupational History   Not on file  Tobacco Use   Smoking status: Former    Types: Cigarettes   Smokeless tobacco: Never   Tobacco comments:    quit 2006  Vaping Use   Vaping Use: Never used  Substance and Sexual Activity   Alcohol use: Yes    Alcohol/week: 4.0 standard drinks of alcohol    Types: 1 Glasses of wine, 3 Cans of beer per week   Drug use: No   Sexual activity: Yes    Birth control/protection: Surgical    Comment: BTL  Other Topics Concern   Not on file  Social History Narrative   Married.   Two children- daughter and son. Daughter at [employer] and thinking about medical profession.       Social Determinants of Health   Financial Resource Strain: Not on file  Food Insecurity: Not on file  Transportation Needs: Not on file  Physical Activity: Not on file  Stress: Not on file  Social Connections: Not on file  Intimate Partner Violence: Not on file    Review of Systems: See HPI, otherwise negative ROS  Physical Exam: BP 115/62   Pulse 82   Temp 97.6 F (36.4 C) (Temporal)   Resp 18   Ht '5\' 5"'$  (1.651 m)   Wt 98.2 kg   LMP  (LMP Unknown) Comment: bleeding since beginning early 2023  SpO2 100%   BMI 36.01 kg/m  General:   Alert,  pleasant and cooperative in NAD Head:  Normocephalic and atraumatic. Neck:  Supple; no masses or thyromegaly. Lungs:  Clear throughout to auscultation.    Heart:  Regular rate and rhythm. Abdomen:  Soft, nontender and nondistended. Normal bowel sounds, without guarding, and without rebound.   Neurologic:  Alert and  oriented x4;  grossly normal neurologically.  Impression/Plan: [name] is here for an colonoscopy to be performed for colon cancer screening  Risks, benefits, limitations, and alternatives regarding  colonoscopy have been reviewed with the patient.  Questions have  been answered.  All parties agreeable.   Sherri Sear, MD  01/19/2023, 7:55 AM

## 2023-01-19 NOTE — Anesthesia Postprocedure Evaluation (Signed)
Anesthesia Post Note  Patient: [name]  Procedure(s) Performed: COLONOSCOPY WITH PROPOFOL  Patient location during evaluation: Endoscopy Anesthesia Type: General Level of consciousness: awake and alert Pain management: pain level controlled Vital Signs Assessment: post-procedure vital signs reviewed and stable Respiratory status: spontaneous breathing, nonlabored ventilation, respiratory function stable and patient connected to nasal cannula oxygen Cardiovascular status: blood pressure returned to baseline and stable Postop Assessment: no apparent nausea or vomiting Anesthetic complications: no   No notable events documented.   Last Vitals:  Vitals:   01/19/23 0852 01/19/23 0902  BP: (!) 101/45 110/88  Pulse: 75 76  Resp: 18 17  Temp: (!) 36.1 C   SpO2: 100% 100%    Last Pain:  Vitals:   01/19/23 0902  TempSrc:   PainSc: 0-No pain                 Arita Miss

## 2023-01-19 NOTE — Anesthesia Preprocedure Evaluation (Signed)
Anesthesia Evaluation  Patient identified by MRN, date of birth, ID band Patient awake    Reviewed: Allergy & Precautions, NPO status , Patient's Chart, lab work & pertinent test results  History of Anesthesia Complications Negative for: history of anesthetic complications  Airway Mallampati: II  TM Distance: >3 FB Neck ROM: Full    Dental no notable dental hx. (+) Teeth Intact   Pulmonary neg pulmonary ROS, neg sleep apnea, neg COPD, Patient abstained from smoking.Not current smoker, former smoker   Pulmonary exam normal breath sounds clear to auscultation       Cardiovascular Exercise Tolerance: Good METShypertension, (-) CAD and (-) Past MI (-) dysrhythmias  Rhythm:Regular Rate:Normal - Systolic murmurs    Neuro/Psych negative neurological ROS  negative psych ROS   GI/Hepatic ,neg GERD  ,,(+)     (-) substance abuse    Endo/Other  neg diabetes  On wegovy, last taken 9 days ago. Asymptomatic today  Renal/GU negative Renal ROS     Musculoskeletal   Abdominal  (+) + obese  Peds  Hematology  (+) Blood dyscrasia, anemia   Anesthesia Other Findings Past Medical History: No date: Anemia 2014: Blood in urine 2013: Breast mass No date: BV (bacterial vaginosis) No date: Cancer [facility])     Comment:  Breast Jan 2021 No date: Dyspnea     Comment:  due to anemia No date: Edema     Comment:  legs No date: Hypertension No date: Pneumonia No date: Sleep apnea No date: Thyroid disease  Reproductive/Obstetrics                              Anesthesia Physical Anesthesia Plan  ASA: 2  Anesthesia Plan: General   Post-op Pain Management: Minimal or no pain anticipated   Induction: Intravenous  PONV Risk Score and Plan: 2 and Propofol infusion, TIVA and Ondansetron  Airway Management Planned: Nasal Cannula  Additional Equipment: None  Intra-op Plan:   Post-operative Plan:    Informed Consent: I have reviewed the patients History and Physical, chart, labs and discussed the procedure including the risks, benefits and alternatives for the proposed anesthesia with the patient or authorized representative who has indicated his/her understanding and acceptance.     Dental advisory given  Plan Discussed with: CRNA and Surgeon  Anesthesia Plan Comments: (Discussed risks of anesthesia with patient, including possibility of difficulty with spontaneous ventilation under anesthesia necessitating airway intervention, PONV, and rare risks such as cardiac or respiratory or neurological events, and allergic reactions. Discussed the role of CRNA in patient's perioperative care. Patient understands.)         Anesthesia Quick Evaluation

## 2023-01-19 NOTE — Anesthesia Procedure Notes (Signed)
Date/Time: 01/19/2023 8:26 AM  Performed by: Johnna Acosta, CRNAPre-anesthesia Checklist: Patient identified, Emergency Drugs available, Suction available and Patient being monitored Patient Re-evaluated:Patient Re-evaluated prior to induction Oxygen Delivery Method: Nasal cannula Preoxygenation: Pre-oxygenation with 100% oxygen Induction Type: IV induction

## 2023-01-20 ENCOUNTER — Encounter: Payer: Self-pay | Admitting: Gastroenterology

## 2023-01-20 LAB — SURGICAL PATHOLOGY

## 2023-01-23 ENCOUNTER — Encounter: Payer: Self-pay | Admitting: Gastroenterology

## 2023-02-08 DIAGNOSIS — H2513 Age-related nuclear cataract, bilateral: Secondary | ICD-10-CM | POA: Diagnosis not present

## 2023-02-08 DIAGNOSIS — H5213 Myopia, bilateral: Secondary | ICD-10-CM | POA: Diagnosis not present

## 2023-02-08 DIAGNOSIS — H353132 Nonexudative age-related macular degeneration, bilateral, intermediate dry stage: Secondary | ICD-10-CM | POA: Diagnosis not present

## 2023-02-08 DIAGNOSIS — H524 Presbyopia: Secondary | ICD-10-CM | POA: Diagnosis not present

## 2023-02-09 DIAGNOSIS — Z9884 Bariatric surgery status: Secondary | ICD-10-CM | POA: Diagnosis not present

## 2023-02-09 DIAGNOSIS — E669 Obesity, unspecified: Secondary | ICD-10-CM | POA: Diagnosis not present

## 2023-02-09 DIAGNOSIS — Z713 Dietary counseling and surveillance: Secondary | ICD-10-CM | POA: Diagnosis not present

## 2023-02-27 DIAGNOSIS — C50912 Malignant neoplasm of unspecified site of left female breast: Secondary | ICD-10-CM | POA: Diagnosis not present

## 2023-02-27 DIAGNOSIS — Z170 Estrogen receptor positive status [ER+]: Secondary | ICD-10-CM | POA: Diagnosis not present

## 2023-03-06 DIAGNOSIS — D500 Iron deficiency anemia secondary to blood loss (chronic): Secondary | ICD-10-CM | POA: Diagnosis not present

## 2023-03-06 DIAGNOSIS — K9089 Other intestinal malabsorption: Secondary | ICD-10-CM | POA: Diagnosis not present

## 2023-03-06 DIAGNOSIS — E538 Deficiency of other specified B group vitamins: Secondary | ICD-10-CM | POA: Diagnosis not present

## 2023-03-08 DIAGNOSIS — Z8616 Personal history of COVID-19: Secondary | ICD-10-CM | POA: Diagnosis not present

## 2023-03-08 DIAGNOSIS — Z6836 Body mass index (BMI) 36.0-36.9, adult: Secondary | ICD-10-CM | POA: Diagnosis not present

## 2023-03-08 DIAGNOSIS — Z170 Estrogen receptor positive status [ER+]: Secondary | ICD-10-CM | POA: Diagnosis not present

## 2023-03-08 DIAGNOSIS — Z780 Asymptomatic menopausal state: Secondary | ICD-10-CM | POA: Diagnosis not present

## 2023-03-08 DIAGNOSIS — I10 Essential (primary) hypertension: Secondary | ICD-10-CM | POA: Diagnosis not present

## 2023-03-08 DIAGNOSIS — I872 Venous insufficiency (chronic) (peripheral): Secondary | ICD-10-CM | POA: Diagnosis not present

## 2023-03-08 DIAGNOSIS — Z9012 Acquired absence of left breast and nipple: Secondary | ICD-10-CM | POA: Diagnosis not present

## 2023-03-08 DIAGNOSIS — Z90710 Acquired absence of both cervix and uterus: Secondary | ICD-10-CM | POA: Diagnosis not present

## 2023-03-08 DIAGNOSIS — C50812 Malignant neoplasm of overlapping sites of left female breast: Secondary | ICD-10-CM | POA: Diagnosis not present

## 2023-03-08 DIAGNOSIS — Z9884 Bariatric surgery status: Secondary | ICD-10-CM | POA: Diagnosis not present

## 2023-05-03 DIAGNOSIS — B379 Candidiasis, unspecified: Secondary | ICD-10-CM | POA: Diagnosis not present

## 2023-05-03 DIAGNOSIS — J0140 Acute pansinusitis, unspecified: Secondary | ICD-10-CM | POA: Diagnosis not present

## 2023-05-03 DIAGNOSIS — T3695XA Adverse effect of unspecified systemic antibiotic, initial encounter: Secondary | ICD-10-CM | POA: Diagnosis not present

## 2023-05-09 DIAGNOSIS — C50812 Malignant neoplasm of overlapping sites of left female breast: Secondary | ICD-10-CM | POA: Diagnosis not present

## 2023-05-09 DIAGNOSIS — Z170 Estrogen receptor positive status [ER+]: Secondary | ICD-10-CM | POA: Diagnosis not present

## 2023-05-09 DIAGNOSIS — Z79811 Long term (current) use of aromatase inhibitors: Secondary | ICD-10-CM | POA: Diagnosis not present

## 2023-06-08 DIAGNOSIS — Z713 Dietary counseling and surveillance: Secondary | ICD-10-CM | POA: Diagnosis not present

## 2023-06-08 DIAGNOSIS — Z9884 Bariatric surgery status: Secondary | ICD-10-CM | POA: Diagnosis not present

## 2023-06-08 DIAGNOSIS — E669 Obesity, unspecified: Secondary | ICD-10-CM | POA: Diagnosis not present

## 2023-08-09 DIAGNOSIS — D500 Iron deficiency anemia secondary to blood loss (chronic): Secondary | ICD-10-CM | POA: Diagnosis not present

## 2023-08-09 DIAGNOSIS — E538 Deficiency of other specified B group vitamins: Secondary | ICD-10-CM | POA: Diagnosis not present

## 2023-08-09 DIAGNOSIS — Z87891 Personal history of nicotine dependence: Secondary | ICD-10-CM | POA: Diagnosis not present

## 2023-08-09 DIAGNOSIS — D509 Iron deficiency anemia, unspecified: Secondary | ICD-10-CM | POA: Diagnosis not present

## 2023-08-09 DIAGNOSIS — K9089 Other intestinal malabsorption: Secondary | ICD-10-CM | POA: Diagnosis not present

## 2023-08-09 DIAGNOSIS — T148XXA Other injury of unspecified body region, initial encounter: Secondary | ICD-10-CM | POA: Diagnosis not present

## 2023-08-12 ENCOUNTER — Other Ambulatory Visit: Payer: Self-pay | Admitting: Family

## 2023-08-12 DIAGNOSIS — I10 Essential (primary) hypertension: Secondary | ICD-10-CM

## 2023-08-14 DIAGNOSIS — E669 Obesity, unspecified: Secondary | ICD-10-CM | POA: Diagnosis not present

## 2023-08-14 DIAGNOSIS — K912 Postsurgical malabsorption, not elsewhere classified: Secondary | ICD-10-CM | POA: Diagnosis not present

## 2023-08-14 DIAGNOSIS — Z9884 Bariatric surgery status: Secondary | ICD-10-CM | POA: Diagnosis not present

## 2023-08-14 DIAGNOSIS — Z713 Dietary counseling and surveillance: Secondary | ICD-10-CM | POA: Diagnosis not present

## 2023-09-05 DIAGNOSIS — C50812 Malignant neoplasm of overlapping sites of left female breast: Secondary | ICD-10-CM | POA: Diagnosis not present

## 2023-09-05 DIAGNOSIS — Z923 Personal history of irradiation: Secondary | ICD-10-CM | POA: Diagnosis not present

## 2023-09-05 DIAGNOSIS — Z170 Estrogen receptor positive status [ER+]: Secondary | ICD-10-CM | POA: Diagnosis not present

## 2023-09-05 DIAGNOSIS — Z79811 Long term (current) use of aromatase inhibitors: Secondary | ICD-10-CM | POA: Diagnosis not present

## 2023-09-14 DIAGNOSIS — Z9884 Bariatric surgery status: Secondary | ICD-10-CM | POA: Diagnosis not present

## 2023-09-14 DIAGNOSIS — E669 Obesity, unspecified: Secondary | ICD-10-CM | POA: Diagnosis not present

## 2023-09-14 DIAGNOSIS — Z713 Dietary counseling and surveillance: Secondary | ICD-10-CM | POA: Diagnosis not present

## 2023-10-17 ENCOUNTER — Ambulatory Visit (INDEPENDENT_AMBULATORY_CARE_PROVIDER_SITE_OTHER): Payer: BC Managed Care – PPO | Admitting: Nurse Practitioner

## 2023-10-17 ENCOUNTER — Encounter (INDEPENDENT_AMBULATORY_CARE_PROVIDER_SITE_OTHER): Payer: Self-pay | Admitting: Nurse Practitioner

## 2023-10-17 VITALS — BP 103/63 | HR 77 | Resp 16 | Wt 221.0 lb

## 2023-10-17 DIAGNOSIS — I10 Essential (primary) hypertension: Secondary | ICD-10-CM | POA: Diagnosis not present

## 2023-10-17 DIAGNOSIS — I83819 Varicose veins of unspecified lower extremities with pain: Secondary | ICD-10-CM | POA: Diagnosis not present

## 2023-10-22 ENCOUNTER — Encounter (INDEPENDENT_AMBULATORY_CARE_PROVIDER_SITE_OTHER): Payer: Self-pay | Admitting: Nurse Practitioner

## 2023-10-22 NOTE — Progress Notes (Signed)
Subjective:    Patient ID: [name], female    DOB: November 11, 1971, 52 y.o.   MRN: [id number] Chief Complaint  Patient presents with   Follow-up    Patient would like to continue sclerotherapy    The patient returns to the office for followup status post laser ablation of the left and right saphenous vein on 02/24/2022 and 04/28/2022.  The patient note significant improvement in the lower extremity pain but not resolution of the symptoms. The patient notes multiple residual varicosities bilaterally which continued to hurt with dependent positions and remained tender to palpation. The patient's swelling is minimally from preoperative status. The patient continues to wear graduated compression stockings on a daily basis but these are not eliminating the pain and discomfort. The patient continues to use over-the-counter anti-inflammatory medications to treat the pain and related symptoms but this has not given the patient relief. The patient notes the pain in the lower extremities is causing problems with daily exercise, problems at work and even with household activities such as preparing meals and doing dishes.   The patient is otherwise done well and there have been no complications related to the laser procedure or interval changes in the patient's overall    Post laser ultrasound shows successful ablation of the bilateral great saphenous veins She underwent 1 round of sclerotherapy last year however was able to continue with further but because she is continuing to have issues like to move forward with additional sclerotherapy.    Review of Systems  Cardiovascular:  Positive for leg swelling.  All other systems reviewed and are negative.      Objective:   Physical Exam Vitals reviewed.  Cardiovascular:     Rate and Rhythm: Normal rate.     Pulses: Normal pulses.  Pulmonary:     Effort: Pulmonary effort is normal.  Musculoskeletal:        General: Tenderness present.  Skin:     General: Skin is warm and dry.  Neurological:     Mental Status: She is alert and oriented to person, place, and time.  Psychiatric:        Mood and Affect: Mood normal.        Behavior: Behavior normal.        Thought Content: Thought content normal.        Judgment: Judgment normal.     BP 103/63 (BP Location: Right Arm)   Pulse 77   Resp 16   Wt 221 lb (100.2 kg)   LMP  (LMP Unknown) Comment: bleeding since beginning early 2023  BMI 36.78 kg/m   Past Medical History:  Diagnosis Date   Anemia    Blood in urine 2014   Breast mass 2013   BV (bacterial vaginosis)    Cancer (HCC)    Breast Jan 2021   Dyspnea    due to anemia   Edema    legs   Hypertension    Pneumonia    Sleep apnea    Thyroid disease     Social History   Socioeconomic History   Marital status: Married    Spouse name: Chrissie Noa   Number of children: 1   Years of education: Not on file   Highest education level: Not on file  Occupational History   Not on file  Tobacco Use   Smoking status: Former    Types: Cigarettes   Smokeless tobacco: Never   Tobacco comments:    quit 2006  Vaping Use  Vaping status: Never Used  Substance and Sexual Activity   Alcohol use: Yes    Alcohol/week: 4.0 standard drinks of alcohol    Types: 1 Glasses of wine, 3 Cans of beer per week   Drug use: No   Sexual activity: Yes    Birth control/protection: Surgical    Comment: BTL  Other Topics Concern   Not on file  Social History Narrative   Married.   Two children- daughter and son. Daughter at [employer] and thinking about medical profession.       Social Determinants of Health   Financial Resource Strain: Not on file  Food Insecurity: Not on file  Transportation Needs: Not on file  Physical Activity: Not on file  Stress: Not on file  Social Connections: Not on file  Intimate Partner Violence: Not on file    Past Surgical History:  Procedure Laterality Date   ABDOMINAL HYSTERECTOMY     BREAST BIOPSY  Right 2013   benign - fatty tissue   BREAST BIOPSY Left 06/12/2013   fibroadenoma /core biopsy   BREAST BIOPSY Right 06/17/2013   fibrocystic /stereo biopsy   BREAST LUMPECTOMY Left 2021   CESAREAN SECTION  2007   COLONOSCOPY WITH PROPOFOL N/A 01/19/2023   Procedure: COLONOSCOPY WITH PROPOFOL;  Surgeon: Toney Reil, MD;  Location: [facility] END[facility]COPY;  Service: Gastroenterology;  Laterality: N/A;   DILATION AND CURETTAGE OF UTERUS     DILATION AND CURETTAGE OF UTERUS N/A 05/15/2022   Procedure: DILATATION AND CURETTAGE WITH HYSTEROSCOPY POSSIBLE POLYPECTOMY POSSIBLE MYOSURE;  Surgeon: Christeen Douglas, MD;  Location: [facility] ORS;  Service: Gynecology;  Laterality: N/A;   ESOPHAGOGASTRODUODENOSCOPY     FOOT SURGERY Left    01/2017   LAPAROSCOPIC BILATERAL SALPINGO OOPHERECTOMY Bilateral 09/08/2022   Procedure: LAPAROSCOPIC BILATERAL SALPINGO OOPHORECTOMY;  Surgeon: Christeen Douglas, MD;  Location: [facility] ORS;  Service: Gynecology;  Laterality: Bilateral;   LAPAROSCOPIC HYSTERECTOMY N/A 09/08/2022   No cervix, see pathology notes 09/08/22. Procedure: HYSTERECTOMY TOTAL LAPAROSCOPIC;  Surgeon: Christeen Douglas, MD;  Location: [facility] ORS;  Service: Gynecology;  Laterality: N/A;   ROUX-EN-Y PROCEDURE  03/04/2014   Dr. [place]   TONSILLECTOMY     TUBAL LIGATION     WISDOM TOOTH EXTRACTION      Family History  Problem Relation Age of Onset   Arthritis Mother    Diabetes Father    Hypertension Father    Polycystic ovary syndrome Sister    Hypothyroidism Sister    Cancer Maternal Grandmother 31       breast   Ovarian cancer Neg Hx    Colon cancer Neg Hx     Allergies  Allergen Reactions   Tape Other (See Comments)    Blistering, ok with paper tape   Avelox [Moxifloxacin Hcl In Nacl] Palpitations   Chlorhexidine Itching    Chg wipes cause severe itching       Latest Ref Rng & Units 11/04/2022    9:38 AM 09/05/2022    1:36 PM 05/15/2022    7:21 PM  CBC  WBC 4.0 - 10.5 K/uL 6.2  7.0   7.0   Hemoglobin 12.0 - 15.0 g/dL 81.1  91.4  8.8   Hematocrit 36.0 - 46.0 % 38.4  32.7  28.1   Platelets 150.0 - 400.0 K/uL 248.0  264  300       CMP     Component Value Date/Time   NA 141 11/04/2022 0938   NA 145 (H) 04/06/2022 0858   K  3.7 11/04/2022 0938   CL 105 11/04/2022 0938   CO2 30 11/04/2022 0938   GLUCOSE 74 11/04/2022 0938   BUN 19 11/04/2022 0938   BUN 11 04/06/2022 0858   CREATININE 0.68 11/04/2022 0938   CALCIUM 9.4 11/04/2022 0938   PROT 6.7 11/04/2022 0938   ALBUMIN 4.1 11/04/2022 0938   AST 19 11/04/2022 0938   ALT 14 11/04/2022 0938   ALKPHOS 94 11/04/2022 0938   BILITOT 0.4 11/04/2022 0938   GFR 100.75 11/04/2022 0938   EGFR 106 04/06/2022 0858   GFRNONAA >60 05/15/2022 1233     No results found.     Assessment & Plan:   1. Varicose veins with pain Recommend:  The patient has had successful ablation of the previously incompetent saphenous venous system but still has persistent symptoms of pain and swelling that are having a negative impact on daily life and daily activities.  Patient should undergo injection sclerotherapy to treat the residual varicosities.  The risks, benefits and alternative therapies were reviewed in detail with the patient.  All questions were answered.  The patient agrees to proceed with sclerotherapy at their convenience.  The patient will continue wearing the graduated compression stockings and using the over-the-counter pain medications to treat her symptoms.      2. Primary hypertension Continue antihypertensive medications as already ordered, these medications have been reviewed and there are no changes at this time.   Current Outpatient Medications on File Prior to Visit  Medication Sig Dispense Refill   Ferrous Sulfate (SLOW FE) 142 (45 Fe) MG TBCR Take 1 tablet by mouth daily.     hydrochlorothiazide (HYDRODIURIL) 25 MG tablet TAKE 1 TABLET BY MOUTH DAILY. 90 tablet 1   Multiple Vitamins-Iron (MULTIVITAMINS  WITH IRON) TABS tablet Take 1 tablet by mouth daily.     Semaglutide-Weight Management (WEGOVY) 2.4 MG/0.75ML SOAJ Inject 2.4 mg into the skin once a week.     Sod Picosulfate-Mag Ox-Cit Acd (CLENPIQ) 10-3.5-12 MG-GM -GM/160ML SOLN Take 320 mLs by mouth as directed. 320 mL 0   valACYclovir (VALTREX) 500 MG tablet Take 1 tablet (500 mg total) by mouth 2 (two) times daily. (Patient taking differently: Take 500 mg by mouth as needed.) 60 tablet 3   acetaminophen (TYLENOL) 500 MG tablet Take 1,000 mg by mouth every 6 (six) hours as needed for mild pain.     No current facility-administered medications on file prior to visit.    There are no Patient Instructions on file for this visit. No follow-ups on file.   Georgiana Spinner, NP

## 2023-11-14 ENCOUNTER — Other Ambulatory Visit: Payer: Self-pay | Admitting: Family

## 2023-11-14 DIAGNOSIS — Z5181 Encounter for therapeutic drug level monitoring: Secondary | ICD-10-CM | POA: Diagnosis not present

## 2023-11-14 DIAGNOSIS — K912 Postsurgical malabsorption, not elsewhere classified: Secondary | ICD-10-CM | POA: Diagnosis not present

## 2023-11-14 DIAGNOSIS — Z9884 Bariatric surgery status: Secondary | ICD-10-CM | POA: Diagnosis not present

## 2023-11-14 DIAGNOSIS — Z79899 Other long term (current) drug therapy: Secondary | ICD-10-CM | POA: Diagnosis not present

## 2023-11-14 DIAGNOSIS — Z713 Dietary counseling and surveillance: Secondary | ICD-10-CM | POA: Diagnosis not present

## 2023-11-14 DIAGNOSIS — X58XXXA Exposure to other specified factors, initial encounter: Secondary | ICD-10-CM | POA: Diagnosis not present

## 2023-11-14 DIAGNOSIS — T148XXA Other injury of unspecified body region, initial encounter: Secondary | ICD-10-CM | POA: Diagnosis not present

## 2023-11-15 ENCOUNTER — Encounter: Payer: Self-pay | Admitting: Family Medicine

## 2023-11-15 ENCOUNTER — Ambulatory Visit: Payer: BC Managed Care – PPO | Admitting: Family Medicine

## 2023-11-15 VITALS — BP 116/72 | HR 88 | Temp 99.2°F | Ht 65.0 in | Wt 223.2 lb

## 2023-11-15 DIAGNOSIS — R350 Frequency of micturition: Secondary | ICD-10-CM

## 2023-11-15 LAB — POCT URINALYSIS DIPSTICK
Bilirubin, UA: POSITIVE
Glucose, UA: POSITIVE — AB
Ketones, UA: POSITIVE
Nitrite, UA: POSITIVE
Protein, UA: POSITIVE — AB
Spec Grav, UA: 1.015 (ref 1.010–1.025)
Urobilinogen, UA: 4 U/dL — AB
pH, UA: 5 (ref 5.0–8.0)

## 2023-11-15 LAB — URINALYSIS, MICROSCOPIC ONLY

## 2023-11-15 MED ORDER — NITROFURANTOIN MONOHYD MACRO 100 MG PO CAPS: 100.0000 mg | ORAL_CAPSULE | Freq: Two times a day (BID) | ORAL | 0 refills | Status: DC

## 2023-11-15 NOTE — Progress Notes (Signed)
  Marikay Alar, MD Phone: [phone]  [name] is a 52 y.o. female who presents today for same day visit.   Urine frequency: This has been going on a month or so.  She has a dull suprapubic discomfort as well.  No dysuria.  No urgency.  No vaginal discharge.  Notes her urine is cloudy.  Has been taking AZO.  Social History   Tobacco Use  Smoking Status Former   Types: Cigarettes  Smokeless Tobacco Never  Tobacco Comments   quit 2006    Current Outpatient Medications on File Prior to Visit  Medication Sig Dispense Refill   Ferrous Sulfate (SLOW FE) 142 (45 Fe) MG TBCR Take 1 tablet by mouth daily.     hydrochlorothiazide (HYDRODIURIL) 25 MG tablet TAKE 1 TABLET BY MOUTH DAILY. 90 tablet 1   Multiple Vitamins-Iron (MULTIVITAMINS WITH IRON) TABS tablet Take 1 tablet by mouth daily.     Semaglutide-Weight Management (WEGOVY) 2.4 MG/0.75ML SOAJ Inject 2.4 mg into the skin once a week.     Sod Picosulfate-Mag Ox-Cit Acd (CLENPIQ) 10-3.5-12 MG-GM -GM/160ML SOLN Take 320 mLs by mouth as directed. 320 mL 0   valACYclovir (VALTREX) 500 MG tablet TAKE ONE TABLET BY MOUTH TWICE DAILY 60 tablet 3   No current facility-administered medications on file prior to visit.     ROS see history of present illness  Objective  Physical Exam Vitals:   11/15/23 1030  BP: 116/72  Pulse: 88  Temp: 99.2 F (37.3 C)  SpO2: 98%    BP Readings from Last 3 Encounters:  11/15/23 116/72  10/17/23 103/63  01/19/23 110/88   Wt Readings from Last 3 Encounters:  11/15/23 223 lb 3.2 oz (101.2 kg)  10/17/23 221 lb (100.2 kg)  01/19/23 216 lb 6.4 oz (98.2 kg)    Physical Exam Constitutional:      General: She is not in acute distress. Abdominal:     General: Bowel sounds are normal. There is no distension.     Palpations: Abdomen is soft.     Tenderness: There is no abdominal tenderness.  Neurological:     Mental Status: She is alert.      Assessment/Plan: Please see individual  problem list.  Urine frequency Assessment & Plan: Symptoms are concerning for UTI.  Urinalysis is pan positive likely related to the AZO.  Will send urine for culture microscopy.  Will treat with Macrobid 100 mg twice daily for 7 days.  If not improving she will let us know.  Orders: -     Nitrofurantoin Monohyd Macro; Take 1 capsule (100 mg total) by mouth 2 (two) times daily.  Dispense: 14 capsule; Refill: 0 -     Urine Culture -     Urine Microscopic -     POCT urinalysis dipstick     Return if symptoms worsen or fail to improve.   Marikay Alar, MD [facility]

## 2023-11-15 NOTE — Assessment & Plan Note (Signed)
Symptoms are concerning for UTI.  Urinalysis is pan positive likely related to the AZO.  Will send urine for culture microscopy.  Will treat with Macrobid 100 mg twice daily for 7 days.  If not improving she will let us know.

## 2023-11-16 LAB — URINE CULTURE
MICRO NUMBER:: 15787813
Result:: NO GROWTH
SPECIMEN QUALITY:: ADEQUATE

## 2023-11-20 DIAGNOSIS — Z713 Dietary counseling and surveillance: Secondary | ICD-10-CM | POA: Diagnosis not present

## 2023-11-20 DIAGNOSIS — E669 Obesity, unspecified: Secondary | ICD-10-CM | POA: Diagnosis not present

## 2023-11-20 DIAGNOSIS — Z9884 Bariatric surgery status: Secondary | ICD-10-CM | POA: Diagnosis not present

## 2023-11-20 DIAGNOSIS — K912 Postsurgical malabsorption, not elsewhere classified: Secondary | ICD-10-CM | POA: Diagnosis not present

## 2023-11-21 ENCOUNTER — Ambulatory Visit: Payer: BC Managed Care – PPO | Admitting: Family

## 2024-01-23 ENCOUNTER — Encounter (INDEPENDENT_AMBULATORY_CARE_PROVIDER_SITE_OTHER): Payer: Self-pay | Admitting: Nurse Practitioner

## 2024-01-23 ENCOUNTER — Ambulatory Visit (INDEPENDENT_AMBULATORY_CARE_PROVIDER_SITE_OTHER): Payer: No Typology Code available for payment source | Admitting: Nurse Practitioner

## 2024-01-23 VITALS — BP 107/70 | HR 90 | Resp 16 | Wt 217.6 lb

## 2024-01-23 DIAGNOSIS — I83813 Varicose veins of bilateral lower extremities with pain: Secondary | ICD-10-CM | POA: Diagnosis not present

## 2024-01-23 NOTE — Progress Notes (Signed)
 Varicose veins of bilateral  lower extremity with inflammation (454.1  I83.10) Current Plans   Indication: Patient presents with symptomatic varicose veins of the bilateral  lower extremity.   Procedure: Sclerotherapy using hypertonic saline mixed with 1% Lidocaine was performed on the bilateral lower extremity. Compression wraps were placed. The patient tolerated the procedure well.

## 2024-02-20 ENCOUNTER — Ambulatory Visit (INDEPENDENT_AMBULATORY_CARE_PROVIDER_SITE_OTHER): Payer: BLUE CROSS/BLUE SHIELD | Admitting: Nurse Practitioner

## 2024-02-20 ENCOUNTER — Encounter (INDEPENDENT_AMBULATORY_CARE_PROVIDER_SITE_OTHER): Payer: Self-pay | Admitting: Nurse Practitioner

## 2024-02-20 VITALS — BP 115/70 | HR 81 | Resp 16 | Wt 221.0 lb

## 2024-02-20 DIAGNOSIS — I83813 Varicose veins of bilateral lower extremities with pain: Secondary | ICD-10-CM | POA: Diagnosis not present

## 2024-02-20 NOTE — Progress Notes (Signed)
 Varicose veins of bilateral  lower extremity with inflammation (454.1  I83.10) Current Plans   Indication: Patient presents with symptomatic varicose veins of the bilateral  lower extremity.   Procedure: Sclerotherapy using hypertonic saline mixed with 1% Lidocaine was performed on the bilateral lower extremity. Compression wraps were placed. The patient tolerated the procedure well.

## 2024-03-19 ENCOUNTER — Encounter (INDEPENDENT_AMBULATORY_CARE_PROVIDER_SITE_OTHER): Payer: Self-pay | Admitting: Nurse Practitioner

## 2024-03-19 ENCOUNTER — Ambulatory Visit (INDEPENDENT_AMBULATORY_CARE_PROVIDER_SITE_OTHER): Payer: BLUE CROSS/BLUE SHIELD | Admitting: Nurse Practitioner

## 2024-03-19 VITALS — BP 118/69 | HR 82 | Resp 16 | Wt 221.0 lb

## 2024-03-19 DIAGNOSIS — I83813 Varicose veins of bilateral lower extremities with pain: Secondary | ICD-10-CM | POA: Diagnosis not present

## 2024-03-19 NOTE — Progress Notes (Signed)
 Varicose veins of bilateral  lower extremity with inflammation (454.1  I83.10) Current Plans   Indication: Patient presents with symptomatic varicose veins of the bilateral  lower extremity.   Procedure: Sclerotherapy using hypertonic saline mixed with 1% Lidocaine was performed on the bilateral lower extremity. Compression wraps were placed. The patient tolerated the procedure well.

## 2024-05-07 ENCOUNTER — Encounter (INDEPENDENT_AMBULATORY_CARE_PROVIDER_SITE_OTHER): Payer: Self-pay

## 2024-09-09 ENCOUNTER — Ambulatory Visit (INDEPENDENT_AMBULATORY_CARE_PROVIDER_SITE_OTHER): Payer: Self-pay | Admitting: Family

## 2024-09-09 ENCOUNTER — Encounter: Payer: Self-pay | Admitting: Family

## 2024-09-09 VITALS — BP 120/72 | HR 80 | Temp 98.0°F | Ht 65.0 in | Wt 227.6 lb

## 2024-09-09 DIAGNOSIS — F411 Generalized anxiety disorder: Secondary | ICD-10-CM | POA: Diagnosis not present

## 2024-09-09 DIAGNOSIS — Z170 Estrogen receptor positive status [ER+]: Secondary | ICD-10-CM

## 2024-09-09 DIAGNOSIS — Z23 Encounter for immunization: Secondary | ICD-10-CM | POA: Diagnosis not present

## 2024-09-09 DIAGNOSIS — Z0000 Encounter for general adult medical examination without abnormal findings: Secondary | ICD-10-CM

## 2024-09-09 DIAGNOSIS — C50812 Malignant neoplasm of overlapping sites of left female breast: Secondary | ICD-10-CM

## 2024-09-09 MED ORDER — SERTRALINE HCL 50 MG PO TABS: 50.0000 mg | ORAL_TABLET | Freq: Every day | ORAL | 3 refills | Status: AC

## 2024-09-09 NOTE — Patient Instructions (Signed)
 Start zoloft  at bedtime/evenings  Health Maintenance for Postmenopausal Women Menopause is a normal process in which your ability to get pregnant comes to an end. This process happens slowly over many months or years, usually between the ages of 11 and 60. Menopause is complete when you have missed your menstrual period for 12 months. It is important to talk with your health care provider about some of the most common conditions that affect women after menopause (postmenopausal women). These include heart disease, cancer, and bone loss (osteoporosis). Adopting a healthy lifestyle and getting preventive care can help to promote your health and wellness. The actions you take can also lower your chances of developing some of these common conditions. What are the signs and symptoms of menopause? During menopause, you may have the following symptoms: Hot flashes. These can be moderate or severe. Night sweats. Decrease in sex drive. Mood swings. Headaches. Tiredness (fatigue). Irritability. Memory problems. Problems falling asleep or staying asleep. Talk with your health care provider about treatment options for your symptoms. Do I need hormone replacement therapy? Hormone replacement therapy is effective in treating symptoms that are caused by menopause, such as hot flashes and night sweats. Hormone replacement carries certain risks, especially as you become older. If you are thinking about using estrogen or estrogen with progestin, discuss the benefits and risks with your health care provider. How can I reduce my risk for heart disease and stroke? The risk of heart disease, heart attack, and stroke increases as you age. One of the causes may be a change in the body's hormones during menopause. This can affect how your body uses dietary fats, triglycerides, and cholesterol. Heart attack and stroke are medical emergencies. There are many things that you can do to help prevent heart disease and  stroke. Watch your blood pressure High blood pressure causes heart disease and increases the risk of stroke. This is more likely to develop in people who have high blood pressure readings or are overweight. Have your blood pressure checked: Every 3-5 years if you are 4-49 years of age. Every year if you are 76 years old or older. Eat a healthy diet  Eat a diet that includes plenty of vegetables, fruits, low-fat dairy products, and lean protein. Do not eat a lot of foods that are high in solid fats, added sugars, or sodium. Get regular exercise Get regular exercise. This is one of the most important things you can do for your health. Most adults should: Try to exercise for at least 150 minutes each week. The exercise should increase your heart rate and make you sweat (moderate-intensity exercise). Try to do strengthening exercises at least twice each week. Do these in addition to the moderate-intensity exercise. Spend less time sitting. Even light physical activity can be beneficial. Other tips Work with your health care provider to achieve or maintain a healthy weight. Do not use any products that contain nicotine or tobacco. These products include cigarettes, chewing tobacco, and vaping devices, such as e-cigarettes. If you need help quitting, ask your health care provider. Know your numbers. Ask your health care provider to check your cholesterol and your blood sugar (glucose). Continue to have your blood tested as directed by your health care provider. Do I need screening for cancer? Depending on your health history and family history, you may need to have cancer screenings at different stages of your life. This may include screening for: Breast cancer. Cervical cancer. Lung cancer. Colorectal cancer. What is my risk for osteoporosis?  After menopause, you may be at increased risk for osteoporosis. Osteoporosis is a condition in which bone destruction happens more quickly than new bone  creation. To help prevent osteoporosis or the bone fractures that can happen because of osteoporosis, you may take the following actions: If you are 28-58 years old, get at least 1,000 mg of calcium and at least 600 international units (IU) of vitamin D  per day. If you are older than age 83 but younger than age 72, get at least 1,200 mg of calcium and at least 600 international units (IU) of vitamin D  per day. If you are older than age 65, get at least 1,200 mg of calcium and at least 800 international units (IU) of vitamin D  per day. Smoking and drinking excessive alcohol increase the risk of osteoporosis. Eat foods that are rich in calcium and vitamin D , and do weight-bearing exercises several times each week as directed by your health care provider. How does menopause affect my mental health? Depression may occur at any age, but it is more common as you become older. Common symptoms of depression include: Feeling depressed. Changes in sleep patterns. Changes in appetite or eating patterns. Feeling an overall lack of motivation or enjoyment of activities that you previously enjoyed. Frequent crying spells. Talk with your health care provider if you think that you are experiencing any of these symptoms. General instructions See your health care provider for regular wellness exams and vaccines. This may include: Scheduling regular health, dental, and eye exams. Getting and maintaining your vaccines. These include: Influenza vaccine. Get this vaccine each year before the flu season begins. Pneumonia vaccine. Shingles vaccine. Tetanus, diphtheria, and pertussis (Tdap) booster vaccine. Your health care provider may also recommend other immunizations. Tell your health care provider if you have ever been abused or do not feel safe at home. Summary Menopause is a normal process in which your ability to get pregnant comes to an end. This condition causes hot flashes, night sweats, decreased  interest in sex, mood swings, headaches, or lack of sleep. Treatment for this condition may include hormone replacement therapy. Take actions to keep yourself healthy, including exercising regularly, eating a healthy diet, watching your weight, and checking your blood pressure and blood sugar levels. Get screened for cancer and depression. Make sure that you are up to date with all your vaccines. This information is not intended to replace advice given to you by your health care provider. Make sure you discuss any questions you have with your health care provider. Document Revised: 04/26/2021 Document Reviewed: 04/26/2021 Elsevier Patient Education  2024 [employer].

## 2024-09-09 NOTE — Progress Notes (Unsigned)
 Assessment & Plan:  There are no diagnoses linked to this encounter.   Return precautions given.   Risks, benefits, and alternatives of the medications and treatment plan prescribed today were discussed, and patient expressed understanding.   Education regarding symptom management and diagnosis given to patient on AVS either electronically or printed.  No follow-ups on file.  Rollene Northern, FNP  Subjective:    Patient ID: [name], female    DOB: 17-Sep-1971, 53 y.o.   MRN: [id number]  CC: [name] is a 53 y.o. female who presents today for physical exam.    HPI: HPI  Colorectal Cancer Screening: Colonoscopy, Dr. Unk 01/19/2023; repeat in 10 years.  Breast Cancer Screening: 03/05/24 Diag BL mammogram with [facility] BI RADS cat 2 History of left breast cancer,Left Savi Scout localized partial mastectomy which did not localize, with sentinel lymph node biopsy, performed on 01/24/2020. Left breast needle localized partial mastectomy 02/06/2020   following with [facility] oncology Dr Modena. She is not taking letrozole  Cervical Cancer Screening: History of hysterectomy 09/08/2022, No cervix, see pathology notes 09/08/22. Procedure: HYSTERECTOMY TOTAL LAPAROSCOPIC; Surgeon: Verdon Keen, MD; Location: [facility] ORS; Service: Gynecology; Laterality: N/A;    Bone Health screening/DEXA for 65+: UTD 05/09/23 normal ; ordered through [facility] oncology   Lung Cancer Screening: Doesn't have 20 year pack year history and age > 54 years yo 53 years        Tetanus - UTD        Pneumococcal - Candidate for; she declines  Exercise: Gets regular exercise.   Alcohol use:  2-3 glasses of wine most nights. Smoking/tobacco use: Former smoker, quit 2006 Health Maintenance  Topic Date Due   Hepatitis B Vaccine (1 of 3 - 19+ 3-dose series) Never done   Zoster (Shingles) Vaccine (1 of 2) Never done   Pneumococcal Vaccine for age over 66 (1 of 1 - PCV) Never done   Flu Shot  07/19/2024    DTaP/Tdap/Td vaccine (3 - Td or Tdap) 05/04/2032   Colon Cancer Screening  01/19/2033   Hepatitis C Screening  Completed   HIV Screening  Completed   HPV Vaccine  Aged Out   Meningitis B Vaccine  Aged Out   Breast Cancer Screening  Discontinued   COVID-19 Vaccine  Discontinued    ALLERGIES: Tape, Avelox [moxifloxacin hcl in nacl], and Chlorhexidine  Current Outpatient Medications on File Prior to Visit  Medication Sig Dispense Refill   Ferrous Sulfate  (SLOW FE) 142 (45 Fe) MG TBCR Take 1 tablet by mouth daily.     hydrochlorothiazide  (HYDRODIURIL ) 25 MG tablet TAKE 1 TABLET BY MOUTH DAILY. 90 tablet 1   Multiple Vitamins-Iron  (MULTIVITAMINS WITH IRON ) TABS tablet Take 1 tablet by mouth daily.     nitrofurantoin , macrocrystal-monohydrate, (MACROBID ) 100 MG capsule Take 1 capsule (100 mg total) by mouth 2 (two) times daily. (Patient not taking: Reported on 02/20/2024) 14 capsule 0   Semaglutide-Weight Management (WEGOVY) 2.4 MG/0.75ML SOAJ Inject 2.4 mg into the skin once a week.     Sod Picosulfate-Mag Ox-Cit Acd (CLENPIQ ) 10-3.5-12 MG-GM -GM/160ML SOLN Take 320 mLs by mouth as directed. 320 mL 0   valACYclovir  (VALTREX ) 500 MG tablet TAKE ONE TABLET BY MOUTH TWICE DAILY 60 tablet 3   No current facility-administered medications on file prior to visit.    Review of Systems    Objective:    LMP  (LMP Unknown) Comment: bleeding since beginning early 2023  BP Readings from Last 3 Encounters:  03/19/24 118/69  02/20/24 115/70  01/23/24 107/70   Wt Readings from Last 3 Encounters:  03/19/24 221 lb (100.2 kg)  02/20/24 221 lb (100.2 kg)  01/23/24 217 lb 9.6 oz (98.7 kg)    Physical Exam

## 2024-09-09 NOTE — Assessment & Plan Note (Signed)
 Chronic, uncontrolled. Start zoloft  50mg , titrate.  Consider welllbutrin. Counseled on alcohol use and risk of seizure.  Close follow up.

## 2024-09-12 NOTE — Assessment & Plan Note (Signed)
 CBE performed. Deferred pelvic exam in the absence of complaints and she has h/o hysterectomy. Encouraged walking.

## 2024-09-23 ENCOUNTER — Other Ambulatory Visit

## 2024-09-23 DIAGNOSIS — Z0000 Encounter for general adult medical examination without abnormal findings: Secondary | ICD-10-CM | POA: Diagnosis not present

## 2024-09-23 LAB — COMPREHENSIVE METABOLIC PANEL WITH GFR
ALT: 12 U/L (ref 0–35)
AST: 18 U/L (ref 0–37)
Albumin: 4.1 g/dL (ref 3.5–5.2)
Alkaline Phosphatase: 109 U/L (ref 39–117)
BUN: 10 mg/dL (ref 6–23)
CO2: 26 meq/L (ref 19–32)
Calcium: 9.2 mg/dL (ref 8.4–10.5)
Chloride: 108 meq/L (ref 96–112)
Creatinine, Ser: 0.54 mg/dL (ref 0.40–1.20)
GFR: 105.11 mL/min (ref >60.00)
Glucose, Bld: 85 mg/dL (ref 70–99)
Potassium: 4.2 meq/L (ref 3.5–5.1)
Sodium: 144 meq/L (ref 135–145)
Total Bilirubin: 0.5 mg/dL (ref 0.2–1.2)
Total Protein: 6.4 g/dL (ref 6.0–8.3)

## 2024-09-23 LAB — CBC WITH DIFFERENTIAL/PLATELET
Basophils Absolute: 0 K/uL (ref 0.0–0.1)
Basophils Relative: 0.7 % (ref 0.0–3.0)
Eosinophils Absolute: 0.3 K/uL (ref 0.0–0.7)
Eosinophils Relative: 5.2 % — ABNORMAL HIGH (ref 0.0–5.0)
HCT: 38.1 % (ref 36.0–46.0)
Hemoglobin: 12.7 g/dL (ref 12.0–15.0)
Lymphocytes Relative: 32.7 % (ref 12.0–46.0)
Lymphs Abs: 1.8 K/uL (ref 0.7–4.0)
MCHC: 33.5 g/dL (ref 30.0–36.0)
MCV: 89.1 fl (ref 78.0–100.0)
Monocytes Absolute: 0.4 K/uL (ref 0.1–1.0)
Monocytes Relative: 6.6 % (ref 3.0–12.0)
Neutro Abs: 3 K/uL (ref 1.4–7.7)
Neutrophils Relative %: 54.8 % (ref 43.0–77.0)
Platelets: 237 K/uL (ref 150.0–400.0)
RBC: 4.27 Mil/uL (ref 3.87–5.11)
RDW: 13.7 % (ref 11.5–15.5)
WBC: 5.5 K/uL (ref 4.0–10.5)

## 2024-09-23 LAB — LIPID PANEL
Cholesterol: 160 mg/dL (ref 0–200)
HDL: 66.4 mg/dL (ref >39.00)
LDL Cholesterol: 79 mg/dL (ref 0–99)
NonHDL: 93.41
Total CHOL/HDL Ratio: 2
Triglycerides: 74 mg/dL (ref 0.0–149.0)
VLDL: 14.8 mg/dL (ref 0.0–40.0)

## 2024-09-23 LAB — HEMOGLOBIN A1C: Hgb A1c MFr Bld: 4.9 % (ref 4.6–6.5)

## 2024-09-23 LAB — VITAMIN D 25 HYDROXY (VIT D DEFICIENCY, FRACTURES): VITD: 26.96 ng/mL — ABNORMAL LOW (ref 30.00–100.00)

## 2024-09-23 LAB — TSH: TSH: 1.48 u[IU]/mL (ref 0.35–5.50)

## 2024-09-24 ENCOUNTER — Ambulatory Visit: Payer: Self-pay | Admitting: Family

## 2024-09-24 DIAGNOSIS — Z0000 Encounter for general adult medical examination without abnormal findings: Secondary | ICD-10-CM

## 2024-10-23 ENCOUNTER — Ambulatory Visit: Admitting: Family

## 2024-11-05 ENCOUNTER — Other Ambulatory Visit (INDEPENDENT_AMBULATORY_CARE_PROVIDER_SITE_OTHER)

## 2024-11-05 ENCOUNTER — Telehealth: Payer: Self-pay | Admitting: Family

## 2024-11-05 ENCOUNTER — Other Ambulatory Visit: Payer: Self-pay

## 2024-11-05 DIAGNOSIS — Z0000 Encounter for general adult medical examination without abnormal findings: Secondary | ICD-10-CM | POA: Diagnosis not present

## 2024-11-05 DIAGNOSIS — I10 Essential (primary) hypertension: Secondary | ICD-10-CM

## 2024-11-05 MED ORDER — HYDROCHLOROTHIAZIDE 25 MG PO TABS: 25.0000 mg | ORAL_TABLET | Freq: Every day | ORAL | 1 refills | Status: AC

## 2024-11-05 NOTE — Telephone Encounter (Signed)
 Prescription Request  11/05/2024  LOV: 09/09/2024  What is the name of the medication or equipment? hydrochlorothiazide  (HYDRODIURIL ) 25 MG tablet   Have you contacted your pharmacy to request a refill? No   Which pharmacy would you like this sent to?  [facility] - [place], [place] - [address] [place] [address] Phone: [phone] Fax: [phone]   Patient notified that their request is being sent to the clinical staff for review and that they should receive a response within 2 business days.   Please advise at Mobile [phone] (mobile)

## 2024-11-05 NOTE — Telephone Encounter (Signed)
 PT NOTIFIED REFILL SENT IN TO PT PREFERRED PHARMACY

## 2024-11-06 LAB — CBC WITH DIFFERENTIAL/PLATELET
Basophils Absolute: 0.1 K/uL (ref 0.0–0.1)
Basophils Relative: 0.8 % (ref 0.0–3.0)
Eosinophils Absolute: 0.5 K/uL (ref 0.0–0.7)
Eosinophils Relative: 7.1 % — ABNORMAL HIGH (ref 0.0–5.0)
HCT: 37.3 % (ref 36.0–46.0)
Hemoglobin: 12.7 g/dL (ref 12.0–15.0)
Lymphocytes Relative: 25.2 % (ref 12.0–46.0)
Lymphs Abs: 1.6 K/uL (ref 0.7–4.0)
MCHC: 34 g/dL (ref 30.0–36.0)
MCV: 89.7 fl (ref 78.0–100.0)
Monocytes Absolute: 0.4 K/uL (ref 0.1–1.0)
Monocytes Relative: 6.9 % (ref 3.0–12.0)
Neutro Abs: 3.9 K/uL (ref 1.4–7.7)
Neutrophils Relative %: 60 % (ref 43.0–77.0)
Platelets: 257 K/uL (ref 150.0–400.0)
RBC: 4.16 Mil/uL (ref 3.87–5.11)
RDW: 13.4 % (ref 11.5–15.5)
WBC: 6.4 K/uL (ref 4.0–10.5)

## 2024-11-07 ENCOUNTER — Ambulatory Visit: Payer: Self-pay | Admitting: Family

## 2024-11-07 DIAGNOSIS — I10 Essential (primary) hypertension: Secondary | ICD-10-CM
